# Patient Record
Sex: Female | Born: 1945 | Race: White | Hispanic: No | Marital: Married | Smoking: Former smoker
Health system: Southern US, Community
[De-identification: ages and names within clinical notes are randomized; demographics above are authoritative.]

## PROBLEM LIST (undated history)

## (undated) DIAGNOSIS — F0150 Vascular dementia without behavioral disturbance: Secondary | ICD-10-CM

## (undated) DIAGNOSIS — I639 Cerebral infarction, unspecified: Secondary | ICD-10-CM

## (undated) DIAGNOSIS — Z972 Presence of dental prosthetic device (complete) (partial): Secondary | ICD-10-CM

## (undated) DIAGNOSIS — F419 Anxiety disorder, unspecified: Secondary | ICD-10-CM

## (undated) DIAGNOSIS — D126 Benign neoplasm of colon, unspecified: Secondary | ICD-10-CM

## (undated) DIAGNOSIS — N189 Chronic kidney disease, unspecified: Secondary | ICD-10-CM

## (undated) DIAGNOSIS — E785 Hyperlipidemia, unspecified: Secondary | ICD-10-CM

## (undated) DIAGNOSIS — E7800 Pure hypercholesterolemia, unspecified: Secondary | ICD-10-CM

## (undated) DIAGNOSIS — I679 Cerebrovascular disease, unspecified: Secondary | ICD-10-CM

## (undated) DIAGNOSIS — F329 Major depressive disorder, single episode, unspecified: Secondary | ICD-10-CM

## (undated) DIAGNOSIS — I671 Cerebral aneurysm, nonruptured: Secondary | ICD-10-CM

## (undated) DIAGNOSIS — F32A Depression, unspecified: Secondary | ICD-10-CM

## (undated) DIAGNOSIS — R4701 Aphasia: Secondary | ICD-10-CM

## (undated) DIAGNOSIS — G4730 Sleep apnea, unspecified: Secondary | ICD-10-CM

## (undated) HISTORY — DX: Cerebrovascular disease, unspecified: I67.9

## (undated) HISTORY — DX: Sleep apnea, unspecified: G47.30

## (undated) HISTORY — DX: Pure hypercholesterolemia, unspecified: E78.00

## (undated) HISTORY — DX: Major depressive disorder, single episode, unspecified: F32.9

## (undated) HISTORY — DX: Depression, unspecified: F32.A

## (undated) HISTORY — DX: Chronic kidney disease, unspecified: N18.9

## (undated) HISTORY — DX: Anxiety disorder, unspecified: F41.9

## (undated) HISTORY — PX: CEREBRAL ANEURYSM REPAIR: SHX164

## (undated) HISTORY — DX: Hyperlipidemia, unspecified: E78.5

## (undated) HISTORY — DX: Vascular dementia without behavioral disturbance: F01.50

## (undated) HISTORY — DX: Benign neoplasm of colon, unspecified: D12.6

---

## 2004-12-14 DIAGNOSIS — I671 Cerebral aneurysm, nonruptured: Secondary | ICD-10-CM

## 2004-12-14 HISTORY — DX: Cerebral aneurysm, nonruptured: I67.1

## 2005-11-06 ENCOUNTER — Emergency Department: Payer: Self-pay | Admitting: General Practice

## 2005-11-06 ENCOUNTER — Other Ambulatory Visit: Payer: Self-pay

## 2005-11-30 ENCOUNTER — Encounter: Payer: Self-pay | Admitting: Physical Medicine and Rehabilitation

## 2005-12-09 ENCOUNTER — Emergency Department: Payer: Self-pay | Admitting: Emergency Medicine

## 2005-12-14 ENCOUNTER — Encounter: Payer: Self-pay | Admitting: Physical Medicine and Rehabilitation

## 2006-01-14 ENCOUNTER — Encounter: Payer: Self-pay | Admitting: Physical Medicine and Rehabilitation

## 2006-02-11 ENCOUNTER — Encounter: Payer: Self-pay | Admitting: Physical Medicine and Rehabilitation

## 2006-03-14 ENCOUNTER — Encounter: Payer: Self-pay | Admitting: Physical Medicine and Rehabilitation

## 2006-03-15 ENCOUNTER — Other Ambulatory Visit: Payer: Self-pay

## 2006-03-15 ENCOUNTER — Inpatient Hospital Stay: Payer: Self-pay | Admitting: Vascular Surgery

## 2006-08-05 ENCOUNTER — Ambulatory Visit: Payer: Self-pay | Admitting: Internal Medicine

## 2009-06-20 ENCOUNTER — Ambulatory Visit: Payer: Self-pay | Admitting: Family Medicine

## 2009-10-22 ENCOUNTER — Ambulatory Visit: Payer: Self-pay | Admitting: Gastroenterology

## 2010-12-30 ENCOUNTER — Ambulatory Visit: Payer: Self-pay | Admitting: Family Medicine

## 2011-01-01 ENCOUNTER — Ambulatory Visit: Payer: Self-pay | Admitting: Family Medicine

## 2011-07-07 ENCOUNTER — Ambulatory Visit: Payer: Self-pay | Admitting: Family Medicine

## 2012-02-01 ENCOUNTER — Ambulatory Visit: Payer: Self-pay | Admitting: Family Medicine

## 2012-03-17 ENCOUNTER — Emergency Department: Payer: Self-pay | Admitting: Emergency Medicine

## 2012-03-17 LAB — CBC
HCT: 39.4 % (ref 35.0–47.0)
HGB: 13.3 g/dL (ref 12.0–16.0)
MCH: 31.5 pg (ref 26.0–34.0)
MCHC: 33.8 g/dL (ref 32.0–36.0)
MCV: 93 fL (ref 80–100)
Platelet: 269 10*3/uL (ref 150–440)
RBC: 4.23 10*6/uL (ref 3.80–5.20)
RDW: 13.4 % (ref 11.5–14.5)
WBC: 10.1 10*3/uL (ref 3.6–11.0)

## 2012-03-17 LAB — COMPREHENSIVE METABOLIC PANEL
Albumin: 3.9 g/dL (ref 3.4–5.0)
Alkaline Phosphatase: 47 U/L — ABNORMAL LOW (ref 50–136)
Anion Gap: 12 (ref 7–16)
BUN: 27 mg/dL — ABNORMAL HIGH (ref 7–18)
Bilirubin,Total: 0.5 mg/dL (ref 0.2–1.0)
Calcium, Total: 9.3 mg/dL (ref 8.5–10.1)
Chloride: 107 mmol/L (ref 98–107)
Co2: 23 mmol/L (ref 21–32)
Creatinine: 1.28 mg/dL (ref 0.60–1.30)
EGFR (African American): 54 — ABNORMAL LOW
EGFR (Non-African Amer.): 44 — ABNORMAL LOW
Glucose: 116 mg/dL — ABNORMAL HIGH (ref 65–99)
Osmolality: 289 (ref 275–301)
Potassium: 3.3 mmol/L — ABNORMAL LOW (ref 3.5–5.1)
SGOT(AST): 19 U/L (ref 15–37)
SGPT (ALT): 13 U/L
Sodium: 142 mmol/L (ref 136–145)
Total Protein: 8.4 g/dL — ABNORMAL HIGH (ref 6.4–8.2)

## 2012-03-17 LAB — ETHANOL
Ethanol %: <0.003 % (ref 0.000–0.080)
Ethanol: <3 mg/dL

## 2012-03-17 LAB — AMMONIA: Ammonia, Plasma: 32 mcmol/L (ref 11–32)

## 2012-03-17 LAB — TROPONIN I: Troponin-I: <0.02 ng/mL

## 2012-06-14 ENCOUNTER — Ambulatory Visit: Payer: Self-pay | Admitting: Family Medicine

## 2014-03-27 DIAGNOSIS — D126 Benign neoplasm of colon, unspecified: Secondary | ICD-10-CM

## 2014-03-27 DIAGNOSIS — E7800 Pure hypercholesterolemia, unspecified: Secondary | ICD-10-CM | POA: Insufficient documentation

## 2014-03-27 HISTORY — DX: Benign neoplasm of colon, unspecified: D12.6

## 2014-03-27 HISTORY — DX: Pure hypercholesterolemia, unspecified: E78.00

## 2014-06-19 DIAGNOSIS — F0150 Vascular dementia without behavioral disturbance: Secondary | ICD-10-CM

## 2014-06-19 HISTORY — DX: Vascular dementia, unspecified severity, without behavioral disturbance, psychotic disturbance, mood disturbance, and anxiety: F01.50

## 2014-06-28 DIAGNOSIS — I679 Cerebrovascular disease, unspecified: Secondary | ICD-10-CM | POA: Insufficient documentation

## 2014-06-28 HISTORY — DX: Cerebrovascular disease, unspecified: I67.9

## 2016-01-10 DIAGNOSIS — F0150 Vascular dementia without behavioral disturbance: Secondary | ICD-10-CM | POA: Diagnosis not present

## 2016-01-10 DIAGNOSIS — I10 Essential (primary) hypertension: Secondary | ICD-10-CM | POA: Diagnosis not present

## 2016-01-10 DIAGNOSIS — E7800 Pure hypercholesterolemia, unspecified: Secondary | ICD-10-CM | POA: Diagnosis not present

## 2016-01-10 DIAGNOSIS — F3341 Major depressive disorder, recurrent, in partial remission: Secondary | ICD-10-CM | POA: Diagnosis not present

## 2016-01-10 DIAGNOSIS — I679 Cerebrovascular disease, unspecified: Secondary | ICD-10-CM | POA: Diagnosis not present

## 2016-01-22 DIAGNOSIS — R739 Hyperglycemia, unspecified: Secondary | ICD-10-CM | POA: Diagnosis not present

## 2016-01-22 DIAGNOSIS — R7989 Other specified abnormal findings of blood chemistry: Secondary | ICD-10-CM | POA: Diagnosis not present

## 2016-07-13 DIAGNOSIS — I679 Cerebrovascular disease, unspecified: Secondary | ICD-10-CM | POA: Diagnosis not present

## 2016-07-13 DIAGNOSIS — F3341 Major depressive disorder, recurrent, in partial remission: Secondary | ICD-10-CM | POA: Diagnosis not present

## 2016-07-13 DIAGNOSIS — I10 Essential (primary) hypertension: Secondary | ICD-10-CM | POA: Diagnosis not present

## 2016-07-13 DIAGNOSIS — E7800 Pure hypercholesterolemia, unspecified: Secondary | ICD-10-CM | POA: Diagnosis not present

## 2016-07-13 DIAGNOSIS — F0150 Vascular dementia without behavioral disturbance: Secondary | ICD-10-CM | POA: Diagnosis not present

## 2016-07-13 DIAGNOSIS — R7301 Impaired fasting glucose: Secondary | ICD-10-CM | POA: Diagnosis not present

## 2016-08-18 DIAGNOSIS — R7989 Other specified abnormal findings of blood chemistry: Secondary | ICD-10-CM | POA: Diagnosis not present

## 2016-10-07 DIAGNOSIS — N183 Chronic kidney disease, stage 3 (moderate): Secondary | ICD-10-CM | POA: Diagnosis not present

## 2016-10-07 DIAGNOSIS — I10 Essential (primary) hypertension: Secondary | ICD-10-CM | POA: Diagnosis not present

## 2016-10-07 DIAGNOSIS — R809 Proteinuria, unspecified: Secondary | ICD-10-CM | POA: Diagnosis not present

## 2016-10-07 DIAGNOSIS — E7800 Pure hypercholesterolemia, unspecified: Secondary | ICD-10-CM | POA: Diagnosis not present

## 2016-10-27 DIAGNOSIS — N183 Chronic kidney disease, stage 3 (moderate): Secondary | ICD-10-CM | POA: Diagnosis not present

## 2016-11-02 DIAGNOSIS — N183 Chronic kidney disease, stage 3 (moderate): Secondary | ICD-10-CM | POA: Diagnosis not present

## 2016-11-02 DIAGNOSIS — I10 Essential (primary) hypertension: Secondary | ICD-10-CM | POA: Diagnosis not present

## 2016-11-02 DIAGNOSIS — R809 Proteinuria, unspecified: Secondary | ICD-10-CM | POA: Diagnosis not present

## 2016-11-02 DIAGNOSIS — F17290 Nicotine dependence, other tobacco product, uncomplicated: Secondary | ICD-10-CM | POA: Diagnosis not present

## 2017-01-13 DIAGNOSIS — I10 Essential (primary) hypertension: Secondary | ICD-10-CM | POA: Diagnosis not present

## 2017-01-13 DIAGNOSIS — I679 Cerebrovascular disease, unspecified: Secondary | ICD-10-CM | POA: Diagnosis not present

## 2017-01-13 DIAGNOSIS — E7800 Pure hypercholesterolemia, unspecified: Secondary | ICD-10-CM | POA: Diagnosis not present

## 2017-01-13 DIAGNOSIS — R7301 Impaired fasting glucose: Secondary | ICD-10-CM | POA: Diagnosis not present

## 2017-01-13 DIAGNOSIS — F0150 Vascular dementia without behavioral disturbance: Secondary | ICD-10-CM | POA: Diagnosis not present

## 2017-01-13 DIAGNOSIS — F3341 Major depressive disorder, recurrent, in partial remission: Secondary | ICD-10-CM | POA: Diagnosis not present

## 2017-03-01 DIAGNOSIS — N183 Chronic kidney disease, stage 3 (moderate): Secondary | ICD-10-CM | POA: Diagnosis not present

## 2017-03-01 DIAGNOSIS — I10 Essential (primary) hypertension: Secondary | ICD-10-CM | POA: Diagnosis not present

## 2017-03-01 DIAGNOSIS — F17290 Nicotine dependence, other tobacco product, uncomplicated: Secondary | ICD-10-CM | POA: Diagnosis not present

## 2017-03-01 DIAGNOSIS — R809 Proteinuria, unspecified: Secondary | ICD-10-CM | POA: Diagnosis not present

## 2017-03-02 DIAGNOSIS — R55 Syncope and collapse: Secondary | ICD-10-CM | POA: Diagnosis not present

## 2017-03-02 DIAGNOSIS — F3341 Major depressive disorder, recurrent, in partial remission: Secondary | ICD-10-CM | POA: Diagnosis not present

## 2017-03-02 DIAGNOSIS — I10 Essential (primary) hypertension: Secondary | ICD-10-CM | POA: Diagnosis not present

## 2017-03-15 DIAGNOSIS — H353131 Nonexudative age-related macular degeneration, bilateral, early dry stage: Secondary | ICD-10-CM | POA: Diagnosis not present

## 2017-03-15 DIAGNOSIS — H40033 Anatomical narrow angle, bilateral: Secondary | ICD-10-CM | POA: Diagnosis not present

## 2017-03-15 DIAGNOSIS — H2513 Age-related nuclear cataract, bilateral: Secondary | ICD-10-CM | POA: Diagnosis not present

## 2017-03-18 DIAGNOSIS — R809 Proteinuria, unspecified: Secondary | ICD-10-CM | POA: Diagnosis not present

## 2017-03-18 DIAGNOSIS — F17290 Nicotine dependence, other tobacco product, uncomplicated: Secondary | ICD-10-CM | POA: Diagnosis not present

## 2017-03-18 DIAGNOSIS — N183 Chronic kidney disease, stage 3 (moderate): Secondary | ICD-10-CM | POA: Diagnosis not present

## 2017-03-18 DIAGNOSIS — I10 Essential (primary) hypertension: Secondary | ICD-10-CM | POA: Diagnosis not present

## 2017-03-31 DIAGNOSIS — F3341 Major depressive disorder, recurrent, in partial remission: Secondary | ICD-10-CM | POA: Diagnosis not present

## 2017-04-19 DIAGNOSIS — H40003 Preglaucoma, unspecified, bilateral: Secondary | ICD-10-CM | POA: Diagnosis not present

## 2017-04-19 DIAGNOSIS — H353131 Nonexudative age-related macular degeneration, bilateral, early dry stage: Secondary | ICD-10-CM | POA: Diagnosis not present

## 2017-07-01 DIAGNOSIS — I10 Essential (primary) hypertension: Secondary | ICD-10-CM | POA: Diagnosis not present

## 2017-07-01 DIAGNOSIS — R7301 Impaired fasting glucose: Secondary | ICD-10-CM | POA: Diagnosis not present

## 2017-07-01 DIAGNOSIS — F0150 Vascular dementia without behavioral disturbance: Secondary | ICD-10-CM | POA: Diagnosis not present

## 2017-07-01 DIAGNOSIS — I679 Cerebrovascular disease, unspecified: Secondary | ICD-10-CM | POA: Diagnosis not present

## 2017-07-01 DIAGNOSIS — F3341 Major depressive disorder, recurrent, in partial remission: Secondary | ICD-10-CM | POA: Diagnosis not present

## 2017-07-01 DIAGNOSIS — E7800 Pure hypercholesterolemia, unspecified: Secondary | ICD-10-CM | POA: Diagnosis not present

## 2017-07-27 DIAGNOSIS — E782 Mixed hyperlipidemia: Secondary | ICD-10-CM | POA: Diagnosis not present

## 2017-07-27 DIAGNOSIS — N183 Chronic kidney disease, stage 3 (moderate): Secondary | ICD-10-CM | POA: Diagnosis not present

## 2017-07-27 DIAGNOSIS — E7800 Pure hypercholesterolemia, unspecified: Secondary | ICD-10-CM | POA: Diagnosis not present

## 2017-07-27 DIAGNOSIS — R809 Proteinuria, unspecified: Secondary | ICD-10-CM | POA: Diagnosis not present

## 2017-07-27 DIAGNOSIS — I10 Essential (primary) hypertension: Secondary | ICD-10-CM | POA: Diagnosis not present

## 2017-07-28 DIAGNOSIS — R809 Proteinuria, unspecified: Secondary | ICD-10-CM | POA: Diagnosis not present

## 2017-07-28 DIAGNOSIS — E7800 Pure hypercholesterolemia, unspecified: Secondary | ICD-10-CM | POA: Diagnosis not present

## 2017-07-28 DIAGNOSIS — I10 Essential (primary) hypertension: Secondary | ICD-10-CM | POA: Diagnosis not present

## 2017-07-28 DIAGNOSIS — N183 Chronic kidney disease, stage 3 (moderate): Secondary | ICD-10-CM | POA: Diagnosis not present

## 2017-08-23 DIAGNOSIS — H353131 Nonexudative age-related macular degeneration, bilateral, early dry stage: Secondary | ICD-10-CM | POA: Diagnosis not present

## 2017-08-23 DIAGNOSIS — H40033 Anatomical narrow angle, bilateral: Secondary | ICD-10-CM | POA: Diagnosis not present

## 2017-10-04 DIAGNOSIS — Z23 Encounter for immunization: Secondary | ICD-10-CM | POA: Diagnosis not present

## 2017-12-02 DIAGNOSIS — R809 Proteinuria, unspecified: Secondary | ICD-10-CM | POA: Diagnosis not present

## 2017-12-02 DIAGNOSIS — N2581 Secondary hyperparathyroidism of renal origin: Secondary | ICD-10-CM | POA: Diagnosis not present

## 2017-12-02 DIAGNOSIS — I10 Essential (primary) hypertension: Secondary | ICD-10-CM | POA: Diagnosis not present

## 2017-12-02 DIAGNOSIS — N183 Chronic kidney disease, stage 3 (moderate): Secondary | ICD-10-CM | POA: Diagnosis not present

## 2018-01-03 DIAGNOSIS — I679 Cerebrovascular disease, unspecified: Secondary | ICD-10-CM | POA: Diagnosis not present

## 2018-01-03 DIAGNOSIS — I10 Essential (primary) hypertension: Secondary | ICD-10-CM | POA: Diagnosis not present

## 2018-01-03 DIAGNOSIS — F3341 Major depressive disorder, recurrent, in partial remission: Secondary | ICD-10-CM | POA: Diagnosis not present

## 2018-01-03 DIAGNOSIS — F0150 Vascular dementia without behavioral disturbance: Secondary | ICD-10-CM | POA: Diagnosis not present

## 2018-01-03 DIAGNOSIS — E7800 Pure hypercholesterolemia, unspecified: Secondary | ICD-10-CM | POA: Diagnosis not present

## 2018-01-03 DIAGNOSIS — R7301 Impaired fasting glucose: Secondary | ICD-10-CM | POA: Diagnosis not present

## 2018-03-22 DIAGNOSIS — R809 Proteinuria, unspecified: Secondary | ICD-10-CM | POA: Diagnosis not present

## 2018-03-22 DIAGNOSIS — N2581 Secondary hyperparathyroidism of renal origin: Secondary | ICD-10-CM | POA: Diagnosis not present

## 2018-03-22 DIAGNOSIS — I10 Essential (primary) hypertension: Secondary | ICD-10-CM | POA: Diagnosis not present

## 2018-03-22 DIAGNOSIS — N183 Chronic kidney disease, stage 3 (moderate): Secondary | ICD-10-CM | POA: Diagnosis not present

## 2018-03-28 DIAGNOSIS — H353131 Nonexudative age-related macular degeneration, bilateral, early dry stage: Secondary | ICD-10-CM | POA: Diagnosis not present

## 2018-04-11 ENCOUNTER — Ambulatory Visit (INDEPENDENT_AMBULATORY_CARE_PROVIDER_SITE_OTHER): Payer: Medicare Other | Admitting: Urology

## 2018-04-11 ENCOUNTER — Encounter: Payer: Self-pay | Admitting: Urology

## 2018-04-11 VITALS — BP 172/74 | HR 65 | Ht 65.0 in | Wt 159.0 lb

## 2018-04-11 DIAGNOSIS — R32 Unspecified urinary incontinence: Secondary | ICD-10-CM

## 2018-04-11 DIAGNOSIS — N3946 Mixed incontinence: Secondary | ICD-10-CM

## 2018-04-11 LAB — URINALYSIS, COMPLETE
Bilirubin, UA: NEGATIVE
Glucose, UA: NEGATIVE
Ketones, UA: NEGATIVE
Nitrite, UA: NEGATIVE
Protein, UA: NEGATIVE
Specific Gravity, UA: 1.015 (ref 1.005–1.030)
Urobilinogen, Ur: 0.2 mg/dL (ref 0.2–1.0)
pH, UA: 5.5 (ref 5.0–7.5)

## 2018-04-11 LAB — MICROSCOPIC EXAMINATION: RBC, UA: NONE SEEN /hpf (ref 0–2)

## 2018-04-11 MED ORDER — MIRABEGRON ER 50 MG PO TB24: 50.0000 mg | ORAL_TABLET | Freq: Every day | ORAL | 11 refills | Status: DC

## 2018-04-11 NOTE — Progress Notes (Signed)
04/11/2018 3:26 PM   Linda [name] [birth year] [id number]  Referring provider: No referring provider defined for this encounter.  Chief Complaint  Patient presents with  . Urinary Incontinence    HPI: I was consulted to assess the patient's urge incontinence and primarily bedwetting.  She also reports stress incontinence.  She wears 1 pad a day at night that is quite wet.  Details of the history were difficult to ascertain.  She has had a brain tumor.  She has had a stroke.  She gets up twice a night and voids every 2 hours during the daytime  She is not had a hysterectomy.  Presentation is not been medically treated.  She denies a history of kidney stones or previous GU surgery  Modifying factors: There are no other modifying factors  Associated signs and symptoms: There are no other associated signs and symptoms Aggravating and relieving factors: There are no other aggravating or relieving factors Severity: Moderate Duration: Persistent   PMH: Past Medical History:  Diagnosis Date  . Anxiety   . Chronic kidney disease   . Depression   . Sleep apnea     Surgical History: Past Surgical History:  Procedure Laterality Date  . BRAIN TUMOR EXCISION      Home Medications:  Allergies as of 04/11/2018   No Known Allergies     Medication List        Accurate as of 04/11/18  3:26 PM. Always use your most recent med list.          citalopram 20 MG tablet Commonly known as:  CELEXA Take 20 mg by mouth daily.   clopidogrel 75 MG tablet Commonly known as:  PLAVIX Take by mouth.   losartan 50 MG tablet Commonly known as:  COZAAR   simvastatin 80 MG tablet Commonly known as:  ZOCOR   Vitamin D (Ergocalciferol) 50000 units Caps capsule Commonly known as:  DRISDOL       Allergies: No Known Allergies  Family History: History reviewed. No pertinent family history.  Social History:  reports that she has been smoking.  She has never used smokeless tobacco.  She reports that she drank alcohol. She reports that she does not use drugs.  ROS: UROLOGY Frequent Urination?: Yes Hard to postpone urination?: No Burning/pain with urination?: No Get up at night to urinate?: No Leakage of urine?: Yes Urine stream starts and stops?: No Trouble starting stream?: No Do you have to strain to urinate?: No Blood in urine?: No Urinary tract infection?: No Sexually transmitted disease?: No Injury to kidneys or bladder?: No Painful intercourse?: No Weak stream?: No Currently pregnant?: No Vaginal bleeding?: No Last menstrual period?: n  Gastrointestinal Nausea?: No Vomiting?: No Indigestion/heartburn?: No Diarrhea?: No Constipation?: No  Constitutional Fever: No Night sweats?: No Weight loss?: No Fatigue?: Yes  Skin Skin rash/lesions?: No Itching?: No  Eyes Blurred vision?: Yes Double vision?: No  Ears/Nose/Throat Sore throat?: No Sinus problems?: Yes  Hematologic/Lymphatic Swollen glands?: No Easy bruising?: Yes  Cardiovascular Leg swelling?: No Chest pain?: No  Respiratory Cough?: No Shortness of breath?: No  Endocrine Excessive thirst?: No  Musculoskeletal Back pain?: No Joint pain?: No  Neurological Headaches?: Yes Dizziness?: Yes  Psychologic Depression?: Yes Anxiety?: Yes  Physical Exam: BP (!) 172/74 (BP Location: Right Arm, Patient Position: Sitting, Cuff Size: Normal)   Pulse 65   Ht 5\' 5"  (1.651 m)   Wt 159 lb (72.1 kg)   BMI 26.46 kg/m   Constitutional:  Alert  and oriented, No acute distress. HEENT: [place] AT, moist mucus membranes.  Trachea midline, no masses. Cardiovascular: No clubbing, cyanosis, or edema. Respiratory: Normal respiratory effort, no increased work of breathing. GI: Abdomen is soft, nontender, nondistended, no abdominal masses GU: Grade 1 cystocele.  Grade 1 hypermobility of the urethra.  No stress incontinence but cannot cough hard. Skin: No rashes, bruises or suspicious  lesions. Lymph: No cervical or inguinal adenopathy. Neurologic: Grossly intact, no focal deficits, moving all 4 extremities. Psychiatric: Normal mood and affect.  Laboratory Data: Lab Results  Component Value Date   WBC 10.1 03/17/2012   HGB 13.3 03/17/2012   HCT 39.4 03/17/2012   MCV 93 03/17/2012   PLT 269 03/17/2012    Lab Results  Component Value Date   CREATININE 1.28 03/17/2012    \ Urinalysis No results found for: COLORURINE, APPEARANCEUR, LABSPEC, PHURINE, GLUCOSEU, HGBUR, BILIRUBINUR, KETONESUR, PROTEINUR, UROBILINOGEN, NITRITE, LEUKOCYTESUR  Pertinent Imaging: None  Assessment & Plan: The patient has mixed incontinence and bedwetting but likely primarily an overactive bladder.  She is milder frequency and nocturia.  She is not a good historian.  I would like to try the patient on Myrbetriq samples and prescription  I will not order urodynamics at this stage.  I did not perform cystoscopy but send the urine for culture and MAy performed in the future.  She was a little bit nervous  1. Urinary incontinence, unspecified type 2. Mixed incontinence   - Urinalysis, Complete   No follow-ups on file.  Reece Packer, MD  [facility] [address], [place], [place] [address] [phone]

## 2018-04-13 LAB — CULTURE, URINE COMPREHENSIVE

## 2018-04-15 ENCOUNTER — Telehealth: Payer: Self-pay | Admitting: Urology

## 2018-04-15 DIAGNOSIS — H40033 Anatomical narrow angle, bilateral: Secondary | ICD-10-CM | POA: Diagnosis not present

## 2018-04-15 DIAGNOSIS — H2513 Age-related nuclear cataract, bilateral: Secondary | ICD-10-CM | POA: Diagnosis not present

## 2018-04-15 NOTE — Telephone Encounter (Signed)
It looks like you put this pt on Myrbetriq, please advise.

## 2018-04-15 NOTE — Telephone Encounter (Signed)
Husband called office stating that the Rx that [name] sent to [facility] is going to cost them $558 and they can't afford that.  [name], pt's husband wants to know if there is an alternative cheaper medication his wife can take? Please advise pt husband, [name] on his cell @ [phone] not home # Thanks.

## 2018-04-18 ENCOUNTER — Other Ambulatory Visit: Payer: Self-pay

## 2018-04-18 ENCOUNTER — Telehealth: Payer: Self-pay

## 2018-04-18 MED ORDER — NITROFURANTOIN MONOHYD MACRO 100 MG PO CAPS: 100.0000 mg | ORAL_CAPSULE | Freq: Two times a day (BID) | ORAL | 0 refills | Status: DC

## 2018-04-18 NOTE — Telephone Encounter (Signed)
Patient's husband notified and script sent to pharm

## 2018-04-18 NOTE — Telephone Encounter (Signed)
Called and spoke w/ pt husband, [name]. He states he went ahead and just purchased the medication. It is a 90 day supply and he will contact our office to discuss her medications when the 90 day supply ends.

## 2018-04-18 NOTE — Telephone Encounter (Signed)
Detrol LA  4mg 

## 2018-04-18 NOTE — Telephone Encounter (Signed)
-----   Message from Bjorn Loser, MD sent at 04/18/2018  7:46 AM EDT ----- Macrodantin 100 mg twice a day for 7 days   ----- Message ----- From: Royanne Foots, CMA Sent: 04/13/2018   4:54 PM To: Bjorn Loser, MD    ----- Message ----- From: Interface, [facility] Lab Results In Sent: 04/11/2018   4:37 PM To: Rowe Robert Clinical

## 2018-04-21 DIAGNOSIS — H2512 Age-related nuclear cataract, left eye: Secondary | ICD-10-CM | POA: Diagnosis not present

## 2018-04-22 NOTE — Discharge Instructions (Signed)

## 2018-04-27 ENCOUNTER — Encounter
Admission: RE | Disposition: A | Discharge status: Home / Self Care | Payer: Self-pay | Source: Ambulatory Visit | Attending: Ophthalmology

## 2018-04-27 ENCOUNTER — Ambulatory Visit
Admission: RE | Admit: 2018-04-27 | Discharge: 2018-04-27 | Disposition: A | Discharge status: Home / Self Care | Payer: Medicare Other | Source: Ambulatory Visit | Attending: Ophthalmology | Admitting: Ophthalmology

## 2018-04-27 ENCOUNTER — Ambulatory Visit: Payer: Medicare Other | Admitting: Anesthesiology

## 2018-04-27 DIAGNOSIS — F0150 Vascular dementia without behavioral disturbance: Secondary | ICD-10-CM | POA: Insufficient documentation

## 2018-04-27 DIAGNOSIS — N189 Chronic kidney disease, unspecified: Secondary | ICD-10-CM | POA: Insufficient documentation

## 2018-04-27 DIAGNOSIS — F329 Major depressive disorder, single episode, unspecified: Secondary | ICD-10-CM | POA: Diagnosis not present

## 2018-04-27 DIAGNOSIS — Z8673 Personal history of transient ischemic attack (TIA), and cerebral infarction without residual deficits: Secondary | ICD-10-CM | POA: Diagnosis not present

## 2018-04-27 DIAGNOSIS — E7800 Pure hypercholesterolemia, unspecified: Secondary | ICD-10-CM | POA: Diagnosis not present

## 2018-04-27 DIAGNOSIS — H2512 Age-related nuclear cataract, left eye: Secondary | ICD-10-CM | POA: Insufficient documentation

## 2018-04-27 DIAGNOSIS — H25812 Combined forms of age-related cataract, left eye: Secondary | ICD-10-CM | POA: Diagnosis not present

## 2018-04-27 DIAGNOSIS — G4730 Sleep apnea, unspecified: Secondary | ICD-10-CM | POA: Diagnosis not present

## 2018-04-27 DIAGNOSIS — F17210 Nicotine dependence, cigarettes, uncomplicated: Secondary | ICD-10-CM | POA: Diagnosis not present

## 2018-04-27 HISTORY — PX: CATARACT EXTRACTION W/PHACO: SHX586

## 2018-04-27 HISTORY — DX: Cerebral aneurysm, nonruptured: I67.1

## 2018-04-27 HISTORY — DX: Presence of dental prosthetic device (complete) (partial): Z97.2

## 2018-04-27 HISTORY — DX: Vascular dementia, unspecified severity, without behavioral disturbance, psychotic disturbance, mood disturbance, and anxiety: F01.50

## 2018-04-27 HISTORY — DX: Cerebral infarction, unspecified: I63.9

## 2018-04-27 HISTORY — DX: Aphasia: R47.01

## 2018-04-27 SURGERY — PHACOEMULSIFICATION, CATARACT, WITH IOL INSERTION
Anesthesia: Monitor Anesthesia Care | Site: Eye | Laterality: Left | Wound class: "Clean "

## 2018-04-27 MED ORDER — ARMC OPHTHALMIC DILATING DROPS
1.0000 "application " | OPHTHALMIC | Status: DC | PRN
  Administered 2018-04-27 (×3): 1 via OPHTHALMIC

## 2018-04-27 MED ORDER — CEFUROXIME OPHTHALMIC INJECTION 1 MG/0.1 ML
INJECTION | OPHTHALMIC | Status: DC | PRN
  Administered 2018-04-27: 0.1 mL via INTRACAMERAL

## 2018-04-27 MED ORDER — ACETAMINOPHEN 160 MG/5ML PO SOLN: 325.0000 mg | ORAL | Status: DC | PRN

## 2018-04-27 MED ORDER — LIDOCAINE HCL (PF) 2 % IJ SOLN
INTRAOCULAR | Status: DC | PRN
  Administered 2018-04-27: 1 mL

## 2018-04-27 MED ORDER — NA HYALUR & NA CHOND-NA HYALUR 0.4-0.35 ML IO KIT
PACK | INTRAOCULAR | Status: DC | PRN
  Administered 2018-04-27: 1 mL via INTRAOCULAR

## 2018-04-27 MED ORDER — LACTATED RINGERS IV SOLN: INTRAVENOUS | Status: DC

## 2018-04-27 MED ORDER — MOXIFLOXACIN HCL 0.5 % OP SOLN
1.0000 [drp] | OPHTHALMIC | Status: DC | PRN
  Administered 2018-04-27 (×3): 1 [drp] via OPHTHALMIC

## 2018-04-27 MED ORDER — EPINEPHRINE PF 1 MG/ML IJ SOLN
INTRAOCULAR | Status: DC | PRN
  Administered 2018-04-27: 61 mL via OPHTHALMIC

## 2018-04-27 MED ORDER — ACETAMINOPHEN 325 MG PO TABS: 650.0000 mg | ORAL_TABLET | Freq: Once | ORAL | Status: DC | PRN

## 2018-04-27 MED ORDER — FENTANYL CITRATE (PF) 100 MCG/2ML IJ SOLN
INTRAMUSCULAR | Status: DC | PRN
  Administered 2018-04-27: 50 ug via INTRAVENOUS

## 2018-04-27 MED ORDER — BRIMONIDINE TARTRATE-TIMOLOL 0.2-0.5 % OP SOLN
OPHTHALMIC | Status: DC | PRN
  Administered 2018-04-27: 1 [drp] via OPHTHALMIC

## 2018-04-27 MED ORDER — ONDANSETRON HCL 4 MG/2ML IJ SOLN: 4.0000 mg | Freq: Once | INTRAMUSCULAR | Status: DC | PRN

## 2018-04-27 SURGICAL SUPPLY — 20 items
CANNULA ANT/CHMB 27G (MISCELLANEOUS) ×1 IMPLANT
CANNULA ANT/CHMB 27GA (MISCELLANEOUS) ×3 IMPLANT
GLOVE SURG LX 7.5 STRW (GLOVE) ×2
GLOVE SURG LX STRL 7.5 STRW (GLOVE) ×1 IMPLANT
GLOVE SURG TRIUMPH 8.0 PF LTX (GLOVE) ×3 IMPLANT
GOWN STRL REUS W/ TWL LRG LVL3 (GOWN DISPOSABLE) ×2 IMPLANT
GOWN STRL REUS W/TWL LRG LVL3 (GOWN DISPOSABLE) ×4
LENS IOL TECNIS ITEC 22.5 (Intraocular Lens) ×2 IMPLANT
MARKER SKIN DUAL TIP RULER LAB (MISCELLANEOUS) ×3 IMPLANT
NDL FILTER BLUNT 18X1 1/2 (NEEDLE) ×1 IMPLANT
NEEDLE FILTER BLUNT 18X 1/2SAF (NEEDLE) ×2
NEEDLE FILTER BLUNT 18X1 1/2 (NEEDLE) ×1 IMPLANT
PACK CATARACT BRASINGTON (MISCELLANEOUS) ×3 IMPLANT
PACK EYE AFTER SURG (MISCELLANEOUS) ×3 IMPLANT
PACK OPTHALMIC (MISCELLANEOUS) ×3 IMPLANT
SYR 3ML LL SCALE MARK (SYRINGE) ×3 IMPLANT
SYR 5ML LL (SYRINGE) ×3 IMPLANT
SYR TB 1ML LUER SLIP (SYRINGE) ×3 IMPLANT
WATER STERILE IRR 500ML POUR (IV SOLUTION) ×3 IMPLANT
WIPE NON LINTING 3.25X3.25 (MISCELLANEOUS) ×3 IMPLANT

## 2018-04-27 NOTE — Op Note (Signed)
OPERATIVE NOTE  MIGNON BECHLER [id number] 04/27/2018   PREOPERATIVE DIAGNOSIS:  Nuclear sclerotic cataract left eye. H25.12   POSTOPERATIVE DIAGNOSIS:    Nuclear sclerotic cataract left eye.     PROCEDURE:  Phacoemusification with posterior chamber intraocular lens placement of the left eye   LENS:   Implant Name Type Inv. Item Serial No. Manufacturer Lot No. LRB No. Used  LENS IOL DIOP 22.5 - [id number] Intraocular Lens LENS IOL DIOP 22.5 [id number]  Left 1        ULTRASOUND TIME: 13  % of 0 minutes 50 seconds, CDE 6.7  SURGEON:  Wyonia Hough, MD   ANESTHESIA:  Topical with tetracaine drops and 2% Xylocaine jelly, augmented with 1% preservative-free intracameral lidocaine.    COMPLICATIONS:  None.   DESCRIPTION OF PROCEDURE:  The patient was identified in the holding room and transported to the operating room and placed in the supine position under the operating microscope.  The left eye was identified as the operative eye and it was prepped and draped in the usual sterile ophthalmic fashion.   A 1 millimeter clear-corneal paracentesis was made at the 1:30 position.  0.5 ml of preservative-free 1% lidocaine was injected into the anterior chamber.  The anterior chamber was filled with Viscoat viscoelastic.  A 2.4 millimeter keratome was used to make a near-clear corneal incision at the 10:30 position.  .  A curvilinear capsulorrhexis was made with a cystotome and capsulorrhexis forceps.  Balanced salt solution was used to hydrodissect and hydrodelineate the nucleus.   Phacoemulsification was then used in stop and chop fashion to remove the lens nucleus and epinucleus.  The remaining cortex was then removed using the irrigation and aspiration handpiece. Provisc was then placed into the capsular bag to distend it for lens placement.  A lens was then injected into the capsular bag.  The remaining viscoelastic was aspirated.   Wounds were hydrated with balanced salt  solution.  The anterior chamber was inflated to a physiologic pressure with balanced salt solution.  No wound leaks were noted. Cefuroxime 0.1 ml of a 10mg /ml solution was injected into the anterior chamber for a dose of 1 mg of intracameral antibiotic at the completion of the case.   Timolol and Brimonidine drops were applied to the eye.  The patient was taken to the recovery room in stable condition without complications of anesthesia or surgery.  Ziyah Cordoba 04/27/2018, 11:42 AM

## 2018-04-27 NOTE — Transfer of Care (Signed)
Immediate Anesthesia Transfer of Care Note  Patient: Linda [name]  Procedure(s) Performed: CATARACT EXTRACTION PHACO AND INTRAOCULAR LENS PLACEMENT (IOC) LEFT (Left Eye)  Patient Location: PACU  Anesthesia Type: MAC  Level of Consciousness: awake, alert  and patient cooperative  Airway and Oxygen Therapy: Patient Spontanous Breathing and Patient connected to supplemental oxygen  Post-op Assessment: Post-op Vital signs reviewed, Patient's Cardiovascular Status Stable, Respiratory Function Stable, Patent Airway and No signs of Nausea or vomiting  Post-op Vital Signs: Reviewed and stable  Complications: No apparent anesthesia complications

## 2018-04-27 NOTE — Anesthesia Postprocedure Evaluation (Signed)
Anesthesia Post Note  Patient: Linda [name]  Procedure(s) Performed: CATARACT EXTRACTION PHACO AND INTRAOCULAR LENS PLACEMENT (IOC) LEFT (Left Eye)  Patient location during evaluation: PACU Anesthesia Type: MAC Level of consciousness: awake and alert, oriented and patient cooperative Pain management: pain level controlled Vital Signs Assessment: post-procedure vital signs reviewed and stable Respiratory status: spontaneous breathing, nonlabored ventilation and respiratory function stable Cardiovascular status: blood pressure returned to baseline and stable Postop Assessment: adequate PO intake Anesthetic complications: no    Darrin Nipper

## 2018-04-27 NOTE — H&P (Signed)
The History and Physical notes are on paper, have been signed, and are to be scanned. The patient remains stable and unchanged from the H&P.   Previous H&P reviewed, patient examined, and there are no changes.  Linda [name] 04/27/2018 10:56 AM

## 2018-04-27 NOTE — Anesthesia Preprocedure Evaluation (Signed)
Anesthesia Evaluation  Patient identified by MRN, date of birth, ID band Patient awake    Reviewed: Allergy & Precautions, NPO status , Patient's Chart, lab work & pertinent test results  History of Anesthesia Complications Negative for: history of anesthetic complications  Airway Mallampati: IV  TM Distance: >3 FB Neck ROM: Full  Mouth opening: Limited Mouth Opening  Dental  (+) Lower Dentures   Pulmonary sleep apnea , Current Smoker (1 cigarette per day),    Pulmonary exam normal breath sounds clear to auscultation       Cardiovascular Exercise Tolerance: Good negative cardio ROS Normal cardiovascular exam Rhythm:Regular Rate:Normal     Neuro/Psych PSYCHIATRIC DISORDERS Anxiety Depression Dementia Aneurysm and stroke 2006 s/p repair; no residual physical symptoms, but pt with vascular dementia CVA    GI/Hepatic negative GI ROS,   Endo/Other  negative endocrine ROS  Renal/GU negative Renal ROS     Musculoskeletal   Abdominal   Peds  Hematology negative hematology ROS (+)   Anesthesia Other Findings   Reproductive/Obstetrics                             Anesthesia Physical Anesthesia Plan  ASA: III  Anesthesia Plan: MAC   Post-op Pain Management:    Induction: Intravenous  PONV Risk Score and Plan: 1 and TIVA and Treatment may vary due to age or medical condition  Airway Management Planned: Natural Airway  Additional Equipment:   Intra-op Plan:   Post-operative Plan:   Informed Consent: I have reviewed the patients History and Physical, chart, labs and discussed the procedure including the risks, benefits and alternatives for the proposed anesthesia with the patient or authorized representative who has indicated his/her understanding and acceptance.     Plan Discussed with: CRNA  Anesthesia Plan Comments:         Anesthesia Quick Evaluation

## 2018-04-27 NOTE — Anesthesia Procedure Notes (Signed)
Date/Time: 04/27/2018 11:28 AM Performed by: Cameron Ali, CRNA Pre-anesthesia Checklist: Patient identified, Emergency Drugs available, Suction available, Timeout performed and Patient being monitored Patient Re-evaluated:Patient Re-evaluated prior to induction Oxygen Delivery Method: Nasal cannula Placement Confirmation: positive ETCO2

## 2018-04-28 ENCOUNTER — Encounter: Payer: Self-pay | Admitting: Ophthalmology

## 2018-05-19 DIAGNOSIS — H02042 Spastic entropion of right lower eyelid: Secondary | ICD-10-CM | POA: Diagnosis not present

## 2018-05-19 DIAGNOSIS — H02035 Senile entropion of left lower eyelid: Secondary | ICD-10-CM | POA: Diagnosis not present

## 2018-05-20 ENCOUNTER — Telehealth: Payer: Self-pay | Admitting: Urology

## 2018-05-20 NOTE — Telephone Encounter (Signed)
We had to move the patient's app and they needed more samples per Lattie Haw ok to give them a box of Myrbetriq 25mg  lot# [id number] exp 12-20   Sharyn Lull

## 2018-05-23 ENCOUNTER — Ambulatory Visit: Payer: Medicare Other

## 2018-05-23 DIAGNOSIS — F329 Major depressive disorder, single episode, unspecified: Secondary | ICD-10-CM | POA: Diagnosis not present

## 2018-05-23 DIAGNOSIS — F17200 Nicotine dependence, unspecified, uncomplicated: Secondary | ICD-10-CM | POA: Diagnosis not present

## 2018-05-23 DIAGNOSIS — F0390 Unspecified dementia without behavioral disturbance: Secondary | ICD-10-CM | POA: Diagnosis not present

## 2018-05-23 DIAGNOSIS — E785 Hyperlipidemia, unspecified: Secondary | ICD-10-CM | POA: Diagnosis not present

## 2018-05-23 DIAGNOSIS — I10 Essential (primary) hypertension: Secondary | ICD-10-CM | POA: Diagnosis not present

## 2018-05-24 DIAGNOSIS — E785 Hyperlipidemia, unspecified: Secondary | ICD-10-CM | POA: Diagnosis not present

## 2018-05-24 DIAGNOSIS — H02035 Senile entropion of left lower eyelid: Secondary | ICD-10-CM | POA: Diagnosis not present

## 2018-05-24 DIAGNOSIS — F0150 Vascular dementia without behavioral disturbance: Secondary | ICD-10-CM | POA: Diagnosis not present

## 2018-05-24 DIAGNOSIS — H02045 Spastic entropion of left lower eyelid: Secondary | ICD-10-CM | POA: Diagnosis not present

## 2018-05-24 DIAGNOSIS — F17210 Nicotine dependence, cigarettes, uncomplicated: Secondary | ICD-10-CM | POA: Diagnosis not present

## 2018-05-24 DIAGNOSIS — Z8673 Personal history of transient ischemic attack (TIA), and cerebral infarction without residual deficits: Secondary | ICD-10-CM | POA: Diagnosis not present

## 2018-05-24 DIAGNOSIS — H02042 Spastic entropion of right lower eyelid: Secondary | ICD-10-CM | POA: Diagnosis not present

## 2018-05-24 DIAGNOSIS — I10 Essential (primary) hypertension: Secondary | ICD-10-CM | POA: Diagnosis not present

## 2018-05-24 DIAGNOSIS — F419 Anxiety disorder, unspecified: Secondary | ICD-10-CM | POA: Diagnosis not present

## 2018-05-24 DIAGNOSIS — H02105 Unspecified ectropion of left lower eyelid: Secondary | ICD-10-CM | POA: Diagnosis not present

## 2018-05-24 DIAGNOSIS — H02102 Unspecified ectropion of right lower eyelid: Secondary | ICD-10-CM | POA: Diagnosis not present

## 2018-06-08 ENCOUNTER — Other Ambulatory Visit: Payer: Self-pay

## 2018-06-08 ENCOUNTER — Encounter: Payer: Self-pay | Admitting: *Deleted

## 2018-06-08 DIAGNOSIS — H2511 Age-related nuclear cataract, right eye: Secondary | ICD-10-CM | POA: Diagnosis not present

## 2018-06-10 NOTE — Discharge Instructions (Signed)

## 2018-06-13 ENCOUNTER — Ambulatory Visit (INDEPENDENT_AMBULATORY_CARE_PROVIDER_SITE_OTHER): Payer: Medicare Other | Admitting: Urology

## 2018-06-13 ENCOUNTER — Encounter: Payer: Self-pay | Admitting: Urology

## 2018-06-13 VITALS — BP 147/81 | HR 72 | Ht 65.0 in | Wt 155.5 lb

## 2018-06-13 DIAGNOSIS — N3946 Mixed incontinence: Secondary | ICD-10-CM | POA: Diagnosis not present

## 2018-06-13 MED ORDER — OXYBUTYNIN CHLORIDE ER 10 MG PO TB24: 10.0000 mg | ORAL_TABLET | Freq: Every day | ORAL | 11 refills | Status: DC

## 2018-06-13 MED ORDER — TOLTERODINE TARTRATE ER 4 MG PO CP24: 4.0000 mg | ORAL_CAPSULE | Freq: Every day | ORAL | 11 refills | Status: DC

## 2018-06-13 NOTE — Progress Notes (Signed)
06/13/2018 10:48 AM   Linda [name] 1946-02-19 [id number]  Referring provider: Sofie Hartigan, MD [place], [place] [address]  Chief Complaint  Patient presents with  . Urinary Incontinence    HPI: I was consulted to assess the patient's urge incontinence and primarily bedwetting.  She also reports stress incontinence.  She wears 1 pad a day at night that is quite wet.  Details of the history were difficult to ascertain.  She has had a brain tumor.  She has had a stroke.  She gets up twice a night and voids every 2 hours during the daytime    The patient has mixed incontinence and bedwetting but likely primarily an overactive bladder.  She is milder frequency and nocturia.  She is not a good historian.  I would like to try the patient on Myrbetriq samples and prescription  I will not order urodynamics at this stage.  I did not perform cystoscopy but send the urine for culture and MAy performed in the future.  She was a little bit nervous   The patient did beautifully on the Myrbetriq.  No more bedwetting.  Voiding less frequently.  She still has mild stress incontinence.  Unfortunately it $100 per month    PMH: Past Medical History:  Diagnosis Date  . Anxiety   . Brain aneurysm 2006  . Chronic kidney disease   . Depression   . Receptive aphasia   . Sleep apnea   . Stroke ([place])   . Vascular dementia   . Wears dentures     Surgical History: Past Surgical History:  Procedure Laterality Date  . CATARACT EXTRACTION W/PHACO Left 04/27/2018   Procedure: CATARACT EXTRACTION PHACO AND INTRAOCULAR LENS PLACEMENT ([place]) LEFT;  Surgeon: Leandrew Koyanagi, MD;  Location: [place];  Service: Ophthalmology;  Laterality: Left;  . CEREBRAL ANEURYSM REPAIR      Home Medications:  Allergies as of 06/13/2018   No Known Allergies     Medication List        Accurate as of 06/13/18 10:48 AM. Always use your most recent med list.          citalopram 20  MG tablet Commonly known as:  CELEXA Take 20 mg by mouth daily.   clopidogrel 75 MG tablet Commonly known as:  PLAVIX Take by mouth.   losartan 50 MG tablet Commonly known as:  COZAAR   multivitamin-lutein Caps capsule Take 1 capsule by mouth daily.   MYRBETRIQ 25 MG Tb24 tablet Generic drug:  mirabegron ER Take 25 mg by mouth daily.   simvastatin 80 MG tablet Commonly known as:  ZOCOR   Vitamin D (Ergocalciferol) 50000 units Caps capsule Commonly known as:  DRISDOL       Allergies: No Known Allergies  Family History: History reviewed. No pertinent family history.  Social History:  reports that she has been smoking cigarettes.  She has been smoking about 1.00 pack per day. She has never used smokeless tobacco. She reports that she drank alcohol. She reports that she does not use drugs.  ROS: UROLOGY Frequent Urination?: No Hard to postpone urination?: No Burning/pain with urination?: No Get up at night to urinate?: Yes Leakage of urine?: Yes Urine stream starts and stops?: No Trouble starting stream?: No Do you have to strain to urinate?: No Blood in urine?: No Urinary tract infection?: No Sexually transmitted disease?: No Injury to kidneys or bladder?: No Painful intercourse?: No Weak stream?: No Currently pregnant?: No Vaginal  bleeding?: No Last menstrual period?: n  Gastrointestinal Nausea?: No Vomiting?: No Indigestion/heartburn?: No Diarrhea?: No Constipation?: No  Constitutional Fever: No Night sweats?: Yes Weight loss?: No Fatigue?: No  Skin Skin rash/lesions?: No Itching?: No  Eyes Blurred vision?: Yes Double vision?: No  Ears/Nose/Throat Sore throat?: No Sinus problems?: No  Hematologic/Lymphatic Swollen glands?: No Easy bruising?: Yes  Cardiovascular Leg swelling?: No Chest pain?: No  Respiratory Cough?: No Shortness of breath?: No  Endocrine Excessive thirst?: No  Musculoskeletal Back pain?: No Joint pain?:  No  Neurological Headaches?: No Dizziness?: No  Psychologic Depression?: No Anxiety?: Yes  Physical Exam: BP (!) 147/81 (BP Location: Right Arm, Patient Position: Sitting, Cuff Size: Normal)   Pulse 72   Ht 5\' 5"  (1.651 m)   Wt 70.5 kg (155 lb 8 oz)   BMI 25.88 kg/m   Constitutional:  Alert and oriented, No acute distress.  Laboratory Data: Lab Results  Component Value Date   WBC 10.1 03/17/2012   HGB 13.3 03/17/2012   HCT 39.4 03/17/2012   MCV 93 03/17/2012   PLT 269 03/17/2012    Lab Results  Component Value Date   CREATININE 1.28 03/17/2012    No results found for: PSA  No results found for: TESTOSTERONE  No results found for: HGBA1C  Urinalysis    Component Value Date/Time   APPEARANCEUR Cloudy (A) 04/11/2018 1601   GLUCOSEU Negative 04/11/2018 1601   BILIRUBINUR Negative 04/11/2018 1601   PROTEINUR Negative 04/11/2018 1601   NITRITE Negative 04/11/2018 1601   LEUKOCYTESUR Trace (A) 04/11/2018 1601    Pertinent Imaging: None  Assessment & Plan: Prescription of Detrol LA 4 mg and oxybutynin ER 10 mg.  I will see her in 8 to 10 weeks.  She can always go back to the Myrbetriq.  We will proceed accordingly  There are no diagnoses linked to this encounter.  No follow-ups on file.  Reece Packer, MD  [facility] [address], [place], [place] [address] [phone]

## 2018-06-14 ENCOUNTER — Ambulatory Visit: Payer: Medicare Other | Admitting: Anesthesiology

## 2018-06-14 ENCOUNTER — Ambulatory Visit
Admission: RE | Admit: 2018-06-14 | Discharge: 2018-06-14 | Disposition: A | Discharge status: Home / Self Care | Payer: Medicare Other | Source: Ambulatory Visit | Attending: Ophthalmology | Admitting: Ophthalmology

## 2018-06-14 ENCOUNTER — Encounter
Admission: RE | Disposition: A | Discharge status: Home / Self Care | Payer: Self-pay | Source: Ambulatory Visit | Attending: Ophthalmology

## 2018-06-14 DIAGNOSIS — F17200 Nicotine dependence, unspecified, uncomplicated: Secondary | ICD-10-CM | POA: Insufficient documentation

## 2018-06-14 DIAGNOSIS — H25811 Combined forms of age-related cataract, right eye: Secondary | ICD-10-CM | POA: Diagnosis not present

## 2018-06-14 DIAGNOSIS — H2511 Age-related nuclear cataract, right eye: Secondary | ICD-10-CM | POA: Insufficient documentation

## 2018-06-14 DIAGNOSIS — E7800 Pure hypercholesterolemia, unspecified: Secondary | ICD-10-CM | POA: Insufficient documentation

## 2018-06-14 DIAGNOSIS — I129 Hypertensive chronic kidney disease with stage 1 through stage 4 chronic kidney disease, or unspecified chronic kidney disease: Secondary | ICD-10-CM | POA: Diagnosis not present

## 2018-06-14 DIAGNOSIS — Z79899 Other long term (current) drug therapy: Secondary | ICD-10-CM | POA: Insufficient documentation

## 2018-06-14 DIAGNOSIS — N189 Chronic kidney disease, unspecified: Secondary | ICD-10-CM | POA: Insufficient documentation

## 2018-06-14 DIAGNOSIS — F329 Major depressive disorder, single episode, unspecified: Secondary | ICD-10-CM | POA: Insufficient documentation

## 2018-06-14 HISTORY — PX: CATARACT EXTRACTION W/PHACO: SHX586

## 2018-06-14 SURGERY — PHACOEMULSIFICATION, CATARACT, WITH IOL INSERTION
Anesthesia: Monitor Anesthesia Care | Laterality: Right | Wound class: "Clean "

## 2018-06-14 MED ORDER — BRIMONIDINE TARTRATE-TIMOLOL 0.2-0.5 % OP SOLN
OPHTHALMIC | Status: DC | PRN
  Administered 2018-06-14: 1 [drp] via OPHTHALMIC

## 2018-06-14 MED ORDER — FENTANYL CITRATE (PF) 100 MCG/2ML IJ SOLN
INTRAMUSCULAR | Status: DC | PRN
  Administered 2018-06-14: 50 ug via INTRAVENOUS

## 2018-06-14 MED ORDER — OXYCODONE HCL 5 MG/5ML PO SOLN: 5.0000 mg | Freq: Once | ORAL | Status: DC | PRN

## 2018-06-14 MED ORDER — MOXIFLOXACIN HCL 0.5 % OP SOLN
1.0000 [drp] | OPHTHALMIC | Status: DC | PRN
  Administered 2018-06-14 (×3): 1 [drp] via OPHTHALMIC

## 2018-06-14 MED ORDER — CEFUROXIME OPHTHALMIC INJECTION 1 MG/0.1 ML
INJECTION | OPHTHALMIC | Status: DC | PRN
  Administered 2018-06-14: 0.1 mL via OPHTHALMIC

## 2018-06-14 MED ORDER — NA HYALUR & NA CHOND-NA HYALUR 0.4-0.35 ML IO KIT
PACK | INTRAOCULAR | Status: DC | PRN
  Administered 2018-06-14: 1 mL via INTRAOCULAR

## 2018-06-14 MED ORDER — LIDOCAINE HCL (PF) 2 % IJ SOLN
INTRAOCULAR | Status: DC | PRN
  Administered 2018-06-14: 1 mL via INTRAMUSCULAR

## 2018-06-14 MED ORDER — LACTATED RINGERS IV SOLN: INTRAVENOUS | Status: DC

## 2018-06-14 MED ORDER — MIDAZOLAM HCL 2 MG/2ML IJ SOLN
INTRAMUSCULAR | Status: DC | PRN
  Administered 2018-06-14: 1 mg via INTRAVENOUS

## 2018-06-14 MED ORDER — OXYCODONE HCL 5 MG PO TABS: 5.0000 mg | ORAL_TABLET | Freq: Once | ORAL | Status: DC | PRN

## 2018-06-14 MED ORDER — ARMC OPHTHALMIC DILATING DROPS
1.0000 "application " | OPHTHALMIC | Status: DC | PRN
  Administered 2018-06-14 (×3): 1 via OPHTHALMIC

## 2018-06-14 MED ORDER — EPINEPHRINE PF 1 MG/ML IJ SOLN
INTRAOCULAR | Status: DC | PRN
  Administered 2018-06-14: 50 mL via OPHTHALMIC

## 2018-06-14 SURGICAL SUPPLY — 27 items
CANNULA ANT/CHMB 27G (MISCELLANEOUS) ×1 IMPLANT
CANNULA ANT/CHMB 27GA (MISCELLANEOUS) ×3 IMPLANT
CARTRIDGE ABBOTT (MISCELLANEOUS) IMPLANT
GLOVE SURG LX 7.5 STRW (GLOVE) ×2
GLOVE SURG LX STRL 7.5 STRW (GLOVE) ×1 IMPLANT
GLOVE SURG TRIUMPH 8.0 PF LTX (GLOVE) ×3 IMPLANT
GOWN STRL REUS W/ TWL LRG LVL3 (GOWN DISPOSABLE) ×2 IMPLANT
GOWN STRL REUS W/TWL LRG LVL3 (GOWN DISPOSABLE) ×4
LENS IOL TECNIS ITEC 22.0 (Intraocular Lens) ×2 IMPLANT
MARKER SKIN DUAL TIP RULER LAB (MISCELLANEOUS) ×3 IMPLANT
NDL FILTER BLUNT 18X1 1/2 (NEEDLE) ×1 IMPLANT
NDL RETROBULBAR .5 NSTRL (NEEDLE) IMPLANT
NEEDLE FILTER BLUNT 18X 1/2SAF (NEEDLE) ×2
NEEDLE FILTER BLUNT 18X1 1/2 (NEEDLE) ×1 IMPLANT
PACK CATARACT BRASINGTON (MISCELLANEOUS) ×3 IMPLANT
PACK EYE AFTER SURG (MISCELLANEOUS) ×3 IMPLANT
PACK OPTHALMIC (MISCELLANEOUS) ×3 IMPLANT
RING MALYGIN 7.0 (MISCELLANEOUS) IMPLANT
SUT ETHILON 10-0 CS-B-6CS-B-6 (SUTURE)
SUT VICRYL  9 0 (SUTURE)
SUT VICRYL 9 0 (SUTURE) IMPLANT
SUTURE EHLN 10-0 CS-B-6CS-B-6 (SUTURE) IMPLANT
SYR 3ML LL SCALE MARK (SYRINGE) ×3 IMPLANT
SYR 5ML LL (SYRINGE) ×3 IMPLANT
SYR TB 1ML LUER SLIP (SYRINGE) ×3 IMPLANT
WATER STERILE IRR 500ML POUR (IV SOLUTION) ×3 IMPLANT
WIPE NON LINTING 3.25X3.25 (MISCELLANEOUS) ×3 IMPLANT

## 2018-06-14 NOTE — Anesthesia Preprocedure Evaluation (Signed)
Anesthesia Evaluation  Patient identified by MRN, date of birth, ID band Patient awake    Reviewed: Allergy & Precautions, H&P , NPO status , Patient's Chart, lab work & pertinent test results  Airway Mallampati: II  TM Distance: >3 FB Neck ROM: full    Dental no notable dental hx.    Pulmonary Current Smoker,    Pulmonary exam normal        Cardiovascular negative cardio ROS Normal cardiovascular exam     Neuro/Psych    GI/Hepatic negative GI ROS, Neg liver ROS,   Endo/Other    Renal/GU      Musculoskeletal   Abdominal   Peds  Hematology negative hematology ROS (+)   Anesthesia Other Findings   Reproductive/Obstetrics negative OB ROS                             Anesthesia Physical Anesthesia Plan  ASA: II  Anesthesia Plan: MAC   Post-op Pain Management:    Induction:   PONV Risk Score and Plan:   Airway Management Planned:   Additional Equipment:   Intra-op Plan:   Post-operative Plan:   Informed Consent: I have reviewed the patients History and Physical, chart, labs and discussed the procedure including the risks, benefits and alternatives for the proposed anesthesia with the patient or authorized representative who has indicated his/her understanding and acceptance.     Plan Discussed with:   Anesthesia Plan Comments:         Anesthesia Quick Evaluation

## 2018-06-14 NOTE — Transfer of Care (Signed)
Immediate Anesthesia Transfer of Care Note  Patient: Linda [name]  Procedure(s) Performed: CATARACT EXTRACTION PHACO AND INTRAOCULAR LENS PLACEMENT (IOC)  RIGHT (Right )  Patient Location: PACU  Anesthesia Type: MAC  Level of Consciousness: awake, alert  and patient cooperative  Airway and Oxygen Therapy: Patient Spontanous Breathing and Patient connected to supplemental oxygen  Post-op Assessment: Post-op Vital signs reviewed, Patient's Cardiovascular Status Stable, Respiratory Function Stable, Patent Airway and No signs of Nausea or vomiting  Post-op Vital Signs: Reviewed and stable  Complications: No apparent anesthesia complications

## 2018-06-14 NOTE — H&P (Signed)
The History and Physical notes are on paper, have been signed, and are to be scanned. The patient remains stable and unchanged from the H&P.   Previous H&P reviewed, patient examined, and there are no changes.  Linda [name] 06/14/2018 9:57 AM

## 2018-06-14 NOTE — Op Note (Signed)
LOCATION:  [facility]   PREOPERATIVE DIAGNOSIS:    Nuclear sclerotic cataract right eye. H25.11   POSTOPERATIVE DIAGNOSIS:  Nuclear sclerotic cataract right eye.     PROCEDURE:  Phacoemusification with posterior chamber intraocular lens placement of the right eye   LENS:   Implant Name Type Inv. Item Serial No. Manufacturer Lot No. LRB No. Used  LENS IOL DIOP 22.0 - [id number] Intraocular Lens LENS IOL DIOP 22.0 [id number] AMO  Right 1        ULTRASOUND TIME: 14 % of 0 minutes, 56 seconds.  CDE 8.0   SURGEON:  Wyonia Hough, MD   ANESTHESIA:  Topical with tetracaine drops and 2% Xylocaine jelly, augmented with 1% preservative-free intracameral lidocaine.    COMPLICATIONS:  None.   DESCRIPTION OF PROCEDURE:  The patient was identified in the holding room and transported to the operating room and placed in the supine position under the operating microscope.  The right eye was identified as the operative eye and it was prepped and draped in the usual sterile ophthalmic fashion.   A 1 millimeter clear-corneal paracentesis was made at the 12:00 position.  0.5 ml of preservative-free 1% lidocaine was injected into the anterior chamber. The anterior chamber was filled with Viscoat viscoelastic.  A 2.4 millimeter keratome was used to make a near-clear corneal incision at the 9:00 position.  A curvilinear capsulorrhexis was made with a cystotome and capsulorrhexis forceps.  Balanced salt solution was used to hydrodissect and hydrodelineate the nucleus.   Phacoemulsification was then used in stop and chop fashion to remove the lens nucleus and epinucleus.  The remaining cortex was then removed using the irrigation and aspiration handpiece. Provisc was then placed into the capsular bag to distend it for lens placement.  A lens was then injected into the capsular bag.  The remaining viscoelastic was aspirated.   Wounds were hydrated with balanced salt solution.  The anterior  chamber was inflated to a physiologic pressure with balanced salt solution.  No wound leaks were noted. Cefuroxime 0.1 ml of a 10mg /ml solution was injected into the anterior chamber for a dose of 1 mg of intracameral antibiotic at the completion of the case.   Timolol and Brimonidine drops were applied to the eye.  The patient was taken to the recovery room in stable condition without complications of anesthesia or surgery.   Ardella Chhim 06/14/2018, 10:38 AM

## 2018-06-14 NOTE — Anesthesia Postprocedure Evaluation (Signed)
Anesthesia Post Note  Patient: Linda [name]  Procedure(s) Performed: CATARACT EXTRACTION PHACO AND INTRAOCULAR LENS PLACEMENT (IOC)  RIGHT (Right )  Patient location during evaluation: PACU Anesthesia Type: MAC Level of consciousness: awake and alert Pain management: pain level controlled Vital Signs Assessment: post-procedure vital signs reviewed and stable Respiratory status: spontaneous breathing Cardiovascular status: blood pressure returned to baseline Postop Assessment: no headache Anesthetic complications: no    Jaci Standard, III,  Alvie Fowles D

## 2018-06-15 ENCOUNTER — Encounter: Payer: Self-pay | Admitting: Ophthalmology

## 2018-07-06 DIAGNOSIS — I10 Essential (primary) hypertension: Secondary | ICD-10-CM | POA: Diagnosis not present

## 2018-07-06 DIAGNOSIS — F3341 Major depressive disorder, recurrent, in partial remission: Secondary | ICD-10-CM | POA: Diagnosis not present

## 2018-07-06 DIAGNOSIS — F0150 Vascular dementia without behavioral disturbance: Secondary | ICD-10-CM | POA: Diagnosis not present

## 2018-07-06 DIAGNOSIS — R7302 Impaired glucose tolerance (oral): Secondary | ICD-10-CM | POA: Diagnosis not present

## 2018-07-06 DIAGNOSIS — I679 Cerebrovascular disease, unspecified: Secondary | ICD-10-CM | POA: Diagnosis not present

## 2018-07-06 DIAGNOSIS — E7800 Pure hypercholesterolemia, unspecified: Secondary | ICD-10-CM | POA: Diagnosis not present

## 2018-07-21 DIAGNOSIS — D631 Anemia in chronic kidney disease: Secondary | ICD-10-CM | POA: Diagnosis not present

## 2018-07-21 DIAGNOSIS — N183 Chronic kidney disease, stage 3 (moderate): Secondary | ICD-10-CM | POA: Diagnosis not present

## 2018-07-21 DIAGNOSIS — I10 Essential (primary) hypertension: Secondary | ICD-10-CM | POA: Diagnosis not present

## 2018-07-21 DIAGNOSIS — R809 Proteinuria, unspecified: Secondary | ICD-10-CM | POA: Diagnosis not present

## 2018-07-21 DIAGNOSIS — N2581 Secondary hyperparathyroidism of renal origin: Secondary | ICD-10-CM | POA: Diagnosis not present

## 2018-07-22 DIAGNOSIS — H35373 Puckering of macula, bilateral: Secondary | ICD-10-CM | POA: Diagnosis not present

## 2018-08-08 ENCOUNTER — Ambulatory Visit (INDEPENDENT_AMBULATORY_CARE_PROVIDER_SITE_OTHER): Payer: Medicare Other | Admitting: Urology

## 2018-08-08 ENCOUNTER — Encounter: Payer: Self-pay | Admitting: Urology

## 2018-08-08 VITALS — BP 137/50 | HR 70 | Ht 65.0 in | Wt 159.0 lb

## 2018-08-08 DIAGNOSIS — N3946 Mixed incontinence: Secondary | ICD-10-CM

## 2018-08-08 NOTE — Progress Notes (Signed)
08/08/2018 11:02 AM   Linda [name] Oct 07, 1946 [id number]  Referring provider: Sofie Hartigan, MD [place], [place] [address]  Chief Complaint  Patient presents with  . Urinary Incontinence    8wk follow up    HPI: I was consulted to assess the patient's urge incontinence and primarily bedwetting. She also reports stress incontinence. She wears 1 pad a day at night that is quite wet. Details of the history were difficult to ascertain.  She has had a brain tumor. She has had a stroke. She gets up twice a night and voids every 2 hours during the daytime    The patient has mixed incontinence and bedwetting but likely primarily an overactive bladder. She is milder frequency and nocturia. She is not a good historian. I would like to try the patient on Myrbetriq samples and prescription I will not order urodynamics at this stage. I did not performcystoscopy but send the urine for culture and MAyperformed in the future. She was a little bit nervous  The patient did beautifully on the Myrbetriq.  No more bedwetting.  Voiding less frequently.  She still has mild stress incontinence.  Unfortunately it $100 per month so was given Detrol and oxybutynin.  She had one positive urine culture in April 2019  Today Frequency stable. Continent with no bedwetting on oxybutynin ER 10 mg very pleased.  Clinically not infected     PMH: Past Medical History:  Diagnosis Date  . Anxiety   . Brain aneurysm 2006  . Chronic kidney disease   . Depression   . Receptive aphasia   . Sleep apnea   . Stroke ([facility])   . Vascular dementia   . Wears dentures     Surgical History: Past Surgical History:  Procedure Laterality Date  . CATARACT EXTRACTION W/PHACO Left 04/27/2018   Procedure: CATARACT EXTRACTION PHACO AND INTRAOCULAR LENS PLACEMENT ([facility]) LEFT;  Surgeon: Leandrew Koyanagi, MD;  Location: [facility];  Service: Ophthalmology;  Laterality: Left;  .  CATARACT EXTRACTION W/PHACO Right 06/14/2018   Procedure: CATARACT EXTRACTION PHACO AND INTRAOCULAR LENS PLACEMENT ([facility])  RIGHT;  Surgeon: Leandrew Koyanagi, MD;  Location: [facility];  Service: Ophthalmology;  Laterality: Right;  . CEREBRAL ANEURYSM REPAIR      Home Medications:  Allergies as of 08/08/2018   No Known Allergies     Medication List        Accurate as of 08/08/18 11:02 AM. Always use your most recent med list.          citalopram 20 MG tablet Commonly known as:  CELEXA Take 20 mg by mouth daily.   clopidogrel 75 MG tablet Commonly known as:  PLAVIX Take by mouth.   losartan 50 MG tablet Commonly known as:  COZAAR   multivitamin-lutein Caps capsule Take 1 capsule by mouth daily.   MYRBETRIQ 25 MG Tb24 tablet Generic drug:  mirabegron ER Take 25 mg by mouth daily.   oxybutynin 10 MG 24 hr tablet Commonly known as:  DITROPAN-XL Take 1 tablet (10 mg total) by mouth daily.   simvastatin 80 MG tablet Commonly known as:  ZOCOR   tolterodine 4 MG 24 hr capsule Commonly known as:  DETROL LA Take 1 capsule (4 mg total) by mouth daily.   Vitamin D (Ergocalciferol) 50000 units Caps capsule Commonly known as:  DRISDOL       Allergies: No Known Allergies  Family History: No family history on file.  Social History:  reports  that she has been smoking cigarettes. She has been smoking about 1.00 pack per day. She has never used smokeless tobacco. She reports that she drank alcohol. She reports that she does not use drugs.  ROS: UROLOGY Frequent Urination?: No Hard to postpone urination?: Yes Burning/pain with urination?: No Get up at night to urinate?: Yes Leakage of urine?: Yes Urine stream starts and stops?: No Trouble starting stream?: No Do you have to strain to urinate?: No Blood in urine?: No Urinary tract infection?: No Sexually transmitted disease?: No Injury to kidneys or bladder?: No Painful intercourse?: No Weak stream?:  No Currently pregnant?: No Vaginal bleeding?: No Last menstrual period?: n  Gastrointestinal Nausea?: No Vomiting?: No Indigestion/heartburn?: No Diarrhea?: No Constipation?: No  Constitutional Fever: No Night sweats?: No Weight loss?: No Fatigue?: Yes  Skin Skin rash/lesions?: No Itching?: No  Eyes Blurred vision?: No Double vision?: No  Ears/Nose/Throat Sore throat?: No Sinus problems?: No  Hematologic/Lymphatic Swollen glands?: No Easy bruising?: No  Cardiovascular Leg swelling?: No Chest pain?: No  Respiratory Cough?: No Shortness of breath?: No  Endocrine Excessive thirst?: No  Musculoskeletal Back pain?: No Joint pain?: No  Neurological Headaches?: No Dizziness?: No  Psychologic Depression?: No Anxiety?: Yes  Physical Exam: BP (!) 137/50   Pulse 70   Ht 5\' 5"  (1.651 m)   Wt 159 lb (72.1 kg)   BMI 26.46 kg/m   Constitutional:  Alert and oriented, No acute distress.  Laboratory Data: Lab Results  Component Value Date   WBC 10.1 03/17/2012   HGB 13.3 03/17/2012   HCT 39.4 03/17/2012   MCV 93 03/17/2012   PLT 269 03/17/2012    Lab Results  Component Value Date   CREATININE 1.28 03/17/2012    No results found for: PSA  No results found for: TESTOSTERONE  No results found for: HGBA1C  Urinalysis    Component Value Date/Time   APPEARANCEUR Cloudy (A) 04/11/2018 1601   GLUCOSEU Negative 04/11/2018 1601   BILIRUBINUR Negative 04/11/2018 1601   PROTEINUR Negative 04/11/2018 1601   NITRITE Negative 04/11/2018 1601   LEUKOCYTESUR Trace (A) 04/11/2018 1601    Pertinent Imaging:   Assessment & Plan: Reassess in 6 months and then annually  There are no diagnoses linked to this encounter.  No follow-ups on file.  Reece Packer, MD  [facility] [address] [place], [place] [address] [phone]

## 2018-10-04 DIAGNOSIS — Z23 Encounter for immunization: Secondary | ICD-10-CM | POA: Diagnosis not present

## 2018-11-24 DIAGNOSIS — I10 Essential (primary) hypertension: Secondary | ICD-10-CM | POA: Diagnosis not present

## 2018-11-24 DIAGNOSIS — N2581 Secondary hyperparathyroidism of renal origin: Secondary | ICD-10-CM | POA: Diagnosis not present

## 2018-11-24 DIAGNOSIS — D631 Anemia in chronic kidney disease: Secondary | ICD-10-CM | POA: Diagnosis not present

## 2018-11-24 DIAGNOSIS — N183 Chronic kidney disease, stage 3 (moderate): Secondary | ICD-10-CM | POA: Diagnosis not present

## 2019-02-13 ENCOUNTER — Ambulatory Visit: Payer: Medicare Other | Admitting: Urology

## 2019-02-20 ENCOUNTER — Ambulatory Visit: Payer: Medicare Other | Admitting: Urology

## 2019-02-20 ENCOUNTER — Ambulatory Visit (INDEPENDENT_AMBULATORY_CARE_PROVIDER_SITE_OTHER): Payer: Medicare Other | Admitting: Urology

## 2019-02-20 ENCOUNTER — Encounter: Payer: Self-pay | Admitting: Urology

## 2019-02-20 VITALS — BP 136/68 | HR 65 | Ht 63.0 in | Wt 154.0 lb

## 2019-02-20 DIAGNOSIS — N3946 Mixed incontinence: Secondary | ICD-10-CM | POA: Diagnosis not present

## 2019-02-20 DIAGNOSIS — E785 Hyperlipidemia, unspecified: Secondary | ICD-10-CM

## 2019-02-20 DIAGNOSIS — F329 Major depressive disorder, single episode, unspecified: Secondary | ICD-10-CM | POA: Insufficient documentation

## 2019-02-20 DIAGNOSIS — F32A Depression, unspecified: Secondary | ICD-10-CM | POA: Insufficient documentation

## 2019-02-20 DIAGNOSIS — R4701 Aphasia: Secondary | ICD-10-CM | POA: Insufficient documentation

## 2019-02-20 HISTORY — DX: Hyperlipidemia, unspecified: E78.5

## 2019-02-20 NOTE — Progress Notes (Signed)
02/20/2019 1:06 PM   Linda [name] Jul 30, 1946 [id number]  Referring provider: Sofie Hartigan, MD [place], [place] [address]  Chief Complaint  Patient presents with  . Urinary Incontinence    6 month    HPI: I was consulted to assess the patient's urge incontinence and primarily bedwetting. She also reports stress incontinence. She wears 1 pad a day at night that is quite wet. Details of the history were difficult to ascertain.  She has had a brain tumor. She has had a stroke. She gets up twice a night and voids every 2 hours during the daytime   The patient has mixed incontinence and bedwetting but likely primarily an overactive bladder. She is milder frequency and nocturia. She is not a good historian. I would like to try the patient on Myrbetriq samples and prescription I will not order urodynamics at this stage. I did not performcystoscopy but send the urine for culture and MAyperformed in the future. She was a little bit nervous  The patient did beautifully on the Myrbetriq. No more bedwetting. Voiding less frequently. She still has mild stress incontinence. Unfortunately it $100 per month so was given Detrol and oxybutynin.  She had one positive urine culture in April 2019  Continent with no bedwetting on oxybutynin ER 10 mg very pleased.   Today No infections.  Frequency stable.  Stop the oxybutynin 2 weeks ago and is currently continent and very pleased   PMH: Past Medical History:  Diagnosis Date  . Anxiety   . Benign neoplasm of colon 03/27/2014  . Brain aneurysm 2006  . Cerebrovascular disease 06/28/2014  . Chronic kidney disease   . Dementia, vascular ([facility]) 06/19/2014  . Depression   . Hyperlipidemia 02/20/2019  . Pure hypercholesterolemia 03/27/2014  . Receptive aphasia   . Sleep apnea   . Stroke ([facility])   . Vascular dementia ([facility])   . Wears dentures     Surgical History: Past Surgical History:  Procedure Laterality Date    . CATARACT EXTRACTION W/PHACO Left 04/27/2018   Procedure: CATARACT EXTRACTION PHACO AND INTRAOCULAR LENS PLACEMENT ([facility]) LEFT;  Surgeon: Leandrew Koyanagi, MD;  Location: [facility];  Service: Ophthalmology;  Laterality: Left;  . CATARACT EXTRACTION W/PHACO Right 06/14/2018   Procedure: CATARACT EXTRACTION PHACO AND INTRAOCULAR LENS PLACEMENT ([facility])  RIGHT;  Surgeon: Leandrew Koyanagi, MD;  Location: [facility];  Service: Ophthalmology;  Laterality: Right;  . CEREBRAL ANEURYSM REPAIR      Home Medications:  Allergies as of 02/20/2019   No Known Allergies     Medication List       Accurate as of February 20, 2019  1:06 PM. Always use your most recent med list.        citalopram 20 MG tablet Commonly known as:  CELEXA Take 20 mg by mouth daily.   clopidogrel 75 MG tablet Commonly known as:  PLAVIX Take by mouth.   losartan 50 MG tablet Commonly known as:  COZAAR   multivitamin-lutein Caps capsule Take 1 capsule by mouth daily.   Myrbetriq 25 MG Tb24 tablet Generic drug:  mirabegron ER Take 25 mg by mouth daily.   oxybutynin 10 MG 24 hr tablet Commonly known as:  DITROPAN-XL Take 1 tablet (10 mg total) by mouth daily.   simvastatin 80 MG tablet Commonly known as:  ZOCOR   tolterodine 4 MG 24 hr capsule Commonly known as:  Detrol LA Take 1 capsule (4 mg total) by mouth daily.  Vitamin D (Ergocalciferol) 1.25 MG (50000 UT) Caps capsule Commonly known as:  DRISDOL       Allergies: No Known Allergies  Family History: No family history on file.  Social History:  reports that she has been smoking cigarettes. She has been smoking about 1.00 pack per day. She has never used smokeless tobacco. She reports previous alcohol use. She reports that she does not use drugs.  ROS: UROLOGY Frequent Urination?: No Hard to postpone urination?: No Burning/pain with urination?: No Get up at night to urinate?: No Leakage of urine?: No Urine stream starts  and stops?: No Trouble starting stream?: No Do you have to strain to urinate?: No Blood in urine?: No Urinary tract infection?: No Sexually transmitted disease?: No Injury to kidneys or bladder?: No Painful intercourse?: No Weak stream?: No Currently pregnant?: No Vaginal bleeding?: No Last menstrual period?: n  Gastrointestinal Nausea?: No Vomiting?: No Indigestion/heartburn?: No Diarrhea?: No Constipation?: No  Constitutional Fever: No Night sweats?: No Weight loss?: No Fatigue?: No  Skin Skin rash/lesions?: No Itching?: No  Eyes Blurred vision?: No Double vision?: No  Ears/Nose/Throat Sore throat?: No Sinus problems?: No  Hematologic/Lymphatic Swollen glands?: No Easy bruising?: No  Cardiovascular Leg swelling?: No Chest pain?: No  Respiratory Cough?: No Shortness of breath?: No  Endocrine Excessive thirst?: No  Musculoskeletal Back pain?: No Joint pain?: No  Neurological Headaches?: No Dizziness?: No  Psychologic Depression?: No Anxiety?: No  Physical Exam: BP 136/68   Pulse 65   Ht 5\' 3"  (1.6 m)   Wt 154 lb (69.9 kg)   BMI 27.28 kg/m   Constitutional:  Alert and oriented, No acute distress.   Laboratory Data: Lab Results  Component Value Date   WBC 10.1 03/17/2012   HGB 13.3 03/17/2012   HCT 39.4 03/17/2012   MCV 93 03/17/2012   PLT 269 03/17/2012    Lab Results  Component Value Date   CREATININE 1.28 03/17/2012    No results found for: PSA  No results found for: TESTOSTERONE  No results found for: HGBA1C  Urinalysis    Component Value Date/Time   APPEARANCEUR Cloudy (A) 04/11/2018 1601   GLUCOSEU Negative 04/11/2018 1601   BILIRUBINUR Negative 04/11/2018 1601   PROTEINUR Negative 04/11/2018 1601   NITRITE Negative 04/11/2018 1601   LEUKOCYTESUR Trace (A) 04/11/2018 1601    Pertinent Imaging:   Assessment & Plan: See PRN.  Instructed patient to restart oxybutynin if symptoms return  There are no  diagnoses linked to this encounter.  No follow-ups on file.  Reece Packer, MD  [facility] [address] [place], [place] [address] [phone]

## 2019-02-23 ENCOUNTER — Other Ambulatory Visit (INDEPENDENT_AMBULATORY_CARE_PROVIDER_SITE_OTHER): Payer: Self-pay | Admitting: Vascular Surgery

## 2019-02-23 ENCOUNTER — Encounter (INDEPENDENT_AMBULATORY_CARE_PROVIDER_SITE_OTHER): Payer: Self-pay

## 2019-02-23 ENCOUNTER — Ambulatory Visit (INDEPENDENT_AMBULATORY_CARE_PROVIDER_SITE_OTHER): Payer: Medicare Other | Admitting: Vascular Surgery

## 2019-02-23 ENCOUNTER — Other Ambulatory Visit: Payer: Self-pay

## 2019-02-23 ENCOUNTER — Ambulatory Visit (INDEPENDENT_AMBULATORY_CARE_PROVIDER_SITE_OTHER): Payer: Medicare Other

## 2019-02-23 ENCOUNTER — Encounter (INDEPENDENT_AMBULATORY_CARE_PROVIDER_SITE_OTHER): Payer: Self-pay | Admitting: Vascular Surgery

## 2019-02-23 VITALS — BP 192/75 | HR 64 | Resp 16 | Ht 66.0 in | Wt 153.4 lb

## 2019-02-23 DIAGNOSIS — I739 Peripheral vascular disease, unspecified: Secondary | ICD-10-CM

## 2019-02-23 DIAGNOSIS — Z7982 Long term (current) use of aspirin: Secondary | ICD-10-CM

## 2019-02-23 DIAGNOSIS — F17210 Nicotine dependence, cigarettes, uncomplicated: Secondary | ICD-10-CM

## 2019-02-23 DIAGNOSIS — E782 Mixed hyperlipidemia: Secondary | ICD-10-CM | POA: Diagnosis not present

## 2019-02-23 DIAGNOSIS — I10 Essential (primary) hypertension: Secondary | ICD-10-CM

## 2019-02-23 DIAGNOSIS — I779 Disorder of arteries and arterioles, unspecified: Secondary | ICD-10-CM

## 2019-02-23 DIAGNOSIS — I6523 Occlusion and stenosis of bilateral carotid arteries: Secondary | ICD-10-CM | POA: Diagnosis not present

## 2019-02-23 DIAGNOSIS — Z7902 Long term (current) use of antithrombotics/antiplatelets: Secondary | ICD-10-CM

## 2019-02-23 DIAGNOSIS — Z79899 Other long term (current) drug therapy: Secondary | ICD-10-CM

## 2019-02-26 ENCOUNTER — Encounter (INDEPENDENT_AMBULATORY_CARE_PROVIDER_SITE_OTHER): Payer: Self-pay | Admitting: Vascular Surgery

## 2019-02-26 DIAGNOSIS — I6529 Occlusion and stenosis of unspecified carotid artery: Secondary | ICD-10-CM | POA: Insufficient documentation

## 2019-02-26 DIAGNOSIS — I10 Essential (primary) hypertension: Secondary | ICD-10-CM | POA: Insufficient documentation

## 2019-02-26 DIAGNOSIS — I739 Peripheral vascular disease, unspecified: Secondary | ICD-10-CM | POA: Insufficient documentation

## 2019-02-26 NOTE — Progress Notes (Signed)
MRN : [id number]  Linda [name] is a 73 y.o. ([birth year]) female who presents with chief complaint of  Chief Complaint  Patient presents with  . New Patient (Initial Visit)    ref [facility] for possible carotid stenosis  .  History of Present Illness:   The patient is seen for evaluation of carotid stenosis. The carotid stenosis was identified after duplex ultrasound was obtained because of a bruit.  The patient denies amaurosis fugax. There is no recent history of TIA symptoms or focal motor deficits. There is no prior documented CVA.  There is no history of migraine headaches. There is no history of seizures.  The patient is taking enteric-coated aspirin 81 mg daily.  The patient has a history of coronary artery disease, no recent episodes of angina or shortness of breath. The patient denies PAD or claudication symptoms. There is a history of hyperlipidemia which is being treated with a statin.   Duplex ultrasound shows <70% bilateral ICA stenosis  Current Meds  Medication Sig  . citalopram (CELEXA) 20 MG tablet Take 20 mg by mouth daily.  . clopidogrel (PLAVIX) 75 MG tablet Take by mouth.  . losartan (COZAAR) 50 MG tablet   . mirabegron ER (MYRBETRIQ) 25 MG TB24 tablet Take 25 mg by mouth daily.  . multivitamin-lutein (OCUVITE-LUTEIN) CAPS capsule Take 1 capsule by mouth daily.  Marland Kitchen oxybutynin (DITROPAN-XL) 10 MG 24 hr tablet Take 1 tablet (10 mg total) by mouth daily.  . simvastatin (ZOCOR) 80 MG tablet   . tolterodine (DETROL LA) 4 MG 24 hr capsule Take 1 capsule (4 mg total) by mouth daily.  . Vitamin D, Ergocalciferol, (DRISDOL) 50000 units CAPS capsule     Past Medical History:  Diagnosis Date  . Anxiety   . Benign neoplasm of colon 03/27/2014  . Brain aneurysm 2006  . Cerebrovascular disease 06/28/2014  . Chronic kidney disease   . Dementia, vascular ([facility]) 06/19/2014  . Depression   . Hyperlipidemia 02/20/2019  . Pure hypercholesterolemia 03/27/2014  . Receptive  aphasia   . Sleep apnea   . Stroke ([facility])   . Vascular dementia ([facility])   . Wears dentures     Past Surgical History:  Procedure Laterality Date  . CATARACT EXTRACTION W/PHACO Left 04/27/2018   Procedure: CATARACT EXTRACTION PHACO AND INTRAOCULAR LENS PLACEMENT ([facility]) LEFT;  Surgeon: Leandrew Koyanagi, MD;  Location: [facility];  Service: Ophthalmology;  Laterality: Left;  . CATARACT EXTRACTION W/PHACO Right 06/14/2018   Procedure: CATARACT EXTRACTION PHACO AND INTRAOCULAR LENS PLACEMENT ([facility])  RIGHT;  Surgeon: Leandrew Koyanagi, MD;  Location: [facility];  Service: Ophthalmology;  Laterality: Right;  . CEREBRAL ANEURYSM REPAIR      Social History Social History   Tobacco Use  . Smoking status: Current Every Day Smoker    Packs/day: 1.00    Types: Cigarettes  . Smokeless tobacco: Never Used  Substance Use Topics  . Alcohol use: Not Currently  . Drug use: Never    Family History Family History  Problem Relation Age of Onset  . Dementia Mother   . Heart attack Brother   No family history of bleeding/clotting disorders, porphyria or autoimmune disease   No Known Allergies   REVIEW OF SYSTEMS (Negative unless checked)  Constitutional: [] Weight loss  [] Fever  [] Chills Cardiac: [] Chest pain   [] Chest pressure   [] Palpitations   [] Shortness of breath when laying flat   [] Shortness of breath with exertion. Vascular:  [] Pain in legs with walking   []   Pain in legs at rest  [] History of DVT   [] Phlebitis   [] Swelling in legs   [] Varicose veins   [] Non-healing ulcers Pulmonary:   [] Uses home oxygen   [] Productive cough   [] Hemoptysis   [] Wheeze  [] COPD   [] Asthma Neurologic:  [] Dizziness   [] Seizures   [] History of stroke   [] History of TIA  [] Aphasia   [] Vissual changes   [] Weakness or numbness in arm   [] Weakness or numbness in leg Musculoskeletal:   [] Joint swelling   [] Joint pain   [] Low back pain Hematologic:  [] Easy bruising  [] Easy bleeding    [] Hypercoagulable state   [] Anemic Gastrointestinal:  [] Diarrhea   [] Vomiting  [] Gastroesophageal reflux/heartburn   [] Difficulty swallowing. Genitourinary:  [] Chronic kidney disease   [] Difficult urination  [] Frequent urination   [] Blood in urine Skin:  [] Rashes   [] Ulcers  Psychological:  [] History of anxiety   []  History of major depression.  Physical Examination  Vitals:   02/23/19 1428  BP: (!) 192/75  Pulse: 64  Resp: 16  Weight: 153 lb 6.4 oz (69.6 kg)  Height: 5\' 6"  (1.676 m)   Body mass index is 24.76 kg/m. Gen: WD/WN, NAD Head: [place]/AT, No temporalis wasting.  Ear/Nose/Throat: Hearing grossly intact, nares w/o erythema or drainage, poor dentition Eyes: PER, EOMI, sclera nonicteric.  Neck: Supple, no masses.  No bruit or JVD.  Pulmonary:  Good air movement, clear to auscultation bilaterally, no use of accessory muscles.  Cardiac: RRR, normal S1, S2, no Murmurs. Vascular: bilateral carotid bruits Vessel Right Left  Radial Palpable Palpable  Brachial Palpable Palpable  Carotid Palpable Palpable  PT Not Palpable Not alpable  DP Trace Palpable Trace Palpable  Gastrointestinal: soft, non-distended. No guarding/no peritoneal signs.  Musculoskeletal: M/S 5/5 throughout.  No deformity or atrophy.  Neurologic: CN 2-12 intact. Pain and light touch intact in extremities.  Symmetrical.  Speech is fluent. Motor exam as listed above. Psychiatric: Judgment intact, Mood & affect appropriate for pt's clinical situation. Dermatologic: No rashes or ulcers noted.  No changes consistent with cellulitis. Lymph : No Cervical lymphadenopathy, no lichenification or skin changes of chronic lymphedema.  CBC Lab Results  Component Value Date   WBC 10.1 03/17/2012   HGB 13.3 03/17/2012   HCT 39.4 03/17/2012   MCV 93 03/17/2012   PLT 269 03/17/2012    BMET    Component Value Date/Time   NA 142 03/17/2012 1016   K 3.3 (L) 03/17/2012 1016   CL 107 03/17/2012 1016   CO2 23 03/17/2012  1016   GLUCOSE 116 (H) 03/17/2012 1016   BUN 27 (H) 03/17/2012 1016   CREATININE 1.28 03/17/2012 1016   CALCIUM 9.3 03/17/2012 1016   GFRNONAA 44 (L) 03/17/2012 1016   GFRAA 54 (L) 03/17/2012 1016   CrCl cannot be calculated (Patient's most recent lab result is older than the maximum 21 days allowed.).  COAG No results found for: INR, PROTIME  Radiology No results found.   Assessment/Plan 1. Bilateral carotid artery stenosis Recommend:  Given the patient's asymptomatic subcritical stenosis no further invasive testing or surgery at this time.  Duplex ultrasound shows <70% stenosis bilaterally.  Continue antiplatelet therapy as prescribed Continue management of CAD, HTN and Hyperlipidemia Healthy heart diet,  encouraged exercise at least 4 times per week Follow up in 6 months with duplex ultrasound and physical exam   - VAS US CAROTID; Future  2. PAD (peripheral artery disease) (HCC)  Recommend:  The patient has evidence of atherosclerosis of  the lower extremities with claudication.  The patient does not voice lifestyle limiting changes at this point in time.  Noninvasive studies do not suggest clinically significant change.  No invasive studies, angiography or surgery at this time The patient should continue walking and begin a more formal exercise program.  The patient should continue antiplatelet therapy and aggressive treatment of the lipid abnormalities  No changes in the patient's medications at this time  The patient should continue wearing graduated compression socks 10-15 mmHg strength to control the mild edema.    3. Mixed hyperlipidemia Continue statin as ordered and reviewed, no changes at this time   4. Essential hypertension Continue antihypertensive medications as already ordered, these medications have been reviewed and there are no changes at this time.    Hortencia Pilar, MD  02/26/2019 2:17 PM

## 2019-03-22 DIAGNOSIS — I10 Essential (primary) hypertension: Secondary | ICD-10-CM | POA: Diagnosis not present

## 2019-03-22 DIAGNOSIS — D631 Anemia in chronic kidney disease: Secondary | ICD-10-CM | POA: Diagnosis not present

## 2019-03-22 DIAGNOSIS — R809 Proteinuria, unspecified: Secondary | ICD-10-CM | POA: Diagnosis not present

## 2019-03-22 DIAGNOSIS — N183 Chronic kidney disease, stage 3 (moderate): Secondary | ICD-10-CM | POA: Diagnosis not present

## 2019-03-22 DIAGNOSIS — N2581 Secondary hyperparathyroidism of renal origin: Secondary | ICD-10-CM | POA: Diagnosis not present

## 2019-08-29 ENCOUNTER — Other Ambulatory Visit: Payer: Self-pay

## 2019-08-29 ENCOUNTER — Ambulatory Visit (INDEPENDENT_AMBULATORY_CARE_PROVIDER_SITE_OTHER): Payer: Medicare Other | Admitting: Nurse Practitioner

## 2019-08-29 ENCOUNTER — Encounter (INDEPENDENT_AMBULATORY_CARE_PROVIDER_SITE_OTHER): Payer: Self-pay | Admitting: Nurse Practitioner

## 2019-08-29 ENCOUNTER — Ambulatory Visit (INDEPENDENT_AMBULATORY_CARE_PROVIDER_SITE_OTHER): Payer: Medicare Other

## 2019-08-29 ENCOUNTER — Encounter (INDEPENDENT_AMBULATORY_CARE_PROVIDER_SITE_OTHER): Payer: Self-pay

## 2019-08-29 VITALS — BP 154/75 | HR 64 | Resp 16 | Wt 156.6 lb

## 2019-08-29 DIAGNOSIS — R200 Anesthesia of skin: Secondary | ICD-10-CM

## 2019-08-29 DIAGNOSIS — I10 Essential (primary) hypertension: Secondary | ICD-10-CM

## 2019-08-29 DIAGNOSIS — E7800 Pure hypercholesterolemia, unspecified: Secondary | ICD-10-CM | POA: Diagnosis not present

## 2019-08-29 DIAGNOSIS — I6523 Occlusion and stenosis of bilateral carotid arteries: Secondary | ICD-10-CM | POA: Diagnosis not present

## 2019-08-29 NOTE — Progress Notes (Signed)
SUBJECTIVE:  Patient ID: Linda [name], female    DOB: 1945-12-30, 73 y.o.   MRN: [id number] Chief Complaint  Patient presents with  . Follow-up    ultrasound follow up    HPI  Linda [name] is a 73 y.o. female The patient is seen for follow up evaluation of carotid stenosis. The carotid stenosis followed by ultrasound.   The patient denies amaurosis fugax. There is no recent history of TIA symptoms or focal motor deficits.   The patient is taking enteric-coated aspirin 81 mg daily.  There is no history of migraine headaches. There is no history of seizures.  The patient has a history of coronary artery disease, no recent episodes of angina or shortness of breath.  There is a history of hyperlipidemia which is being treated with a statin.    The patient also complains of numbness when she walks within her left lower extremity.  The patient has dementia and she is a very poor historian and so her husband tries to help and fill-in, however he is unable to determine if it is pain or weakness or just true numbness.  She is also unable to tell us if it is constant or if it comes and goes or if it is better with rest.  Duplex ultrasound shows 1-39% of the right internal carotid artery and 40 to 59% of the left.  Previous study results were less than 70% although the report of the actual study is not available from outside systems.  Past Medical History:  Diagnosis Date  . Anxiety   . Benign neoplasm of colon 03/27/2014  . Brain aneurysm 2006  . Cerebrovascular disease 06/28/2014  . Chronic kidney disease   . Dementia, vascular ([place]) 06/19/2014  . Depression   . Hyperlipidemia 02/20/2019  . Pure hypercholesterolemia 03/27/2014  . Receptive aphasia   . Sleep apnea   . Stroke ([place])   . Vascular dementia ([place])   . Wears dentures     Past Surgical History:  Procedure Laterality Date  . CATARACT EXTRACTION W/PHACO Left 04/27/2018   Procedure: CATARACT EXTRACTION PHACO AND INTRAOCULAR  LENS PLACEMENT ([place]) LEFT;  Surgeon: Leandrew Koyanagi, MD;  Location: [facility];  Service: Ophthalmology;  Laterality: Left;  . CATARACT EXTRACTION W/PHACO Right 06/14/2018   Procedure: CATARACT EXTRACTION PHACO AND INTRAOCULAR LENS PLACEMENT ([place])  RIGHT;  Surgeon: Leandrew Koyanagi, MD;  Location: [facility];  Service: Ophthalmology;  Laterality: Right;  . CEREBRAL ANEURYSM REPAIR      Social History   Socioeconomic History  . Marital status: Married    Spouse name: Not on file  . Number of children: Not on file  . Years of education: Not on file  . Highest education level: Not on file  Occupational History  . Not on file  Social Needs  . Financial resource strain: Not on file  . Food insecurity    Worry: Not on file    Inability: Not on file  . Transportation needs    Medical: Not on file    Non-medical: Not on file  Tobacco Use  . Smoking status: Current Every Day Smoker    Packs/day: 1.00    Types: Cigarettes  . Smokeless tobacco: Never Used  Substance and Sexual Activity  . Alcohol use: Not Currently  . Drug use: Never  . Sexual activity: Not on file  Lifestyle  . Physical activity    Days per week: Not on file    Minutes per session:  Not on file  . Stress: Not on file  Relationships  . Social Herbalist on phone: Not on file    Gets together: Not on file    Attends religious service: Not on file    Active member of club or organization: Not on file    Attends meetings of clubs or organizations: Not on file    Relationship status: Not on file  . Intimate partner violence    Fear of current or ex partner: Not on file    Emotionally abused: Not on file    Physically abused: Not on file    Forced sexual activity: Not on file  Other Topics Concern  . Not on file  Social History Narrative  . Not on file    Family History  Problem Relation Age of Onset  . Dementia Mother   . Heart attack Brother     No Known Allergies    Review of Systems   Review of Systems: Negative Unless Checked Constitutional: [] Weight loss  [] Fever  [] Chills Cardiac: [] Chest pain   []  Atrial Fibrillation  [] Palpitations   [] Shortness of breath when laying flat   [] Shortness of breath with exertion. [] Shortness of breath at rest Vascular:  [] Pain in legs with walking   [] Pain in legs with standing [] Pain in legs when laying flat   [] Claudication    [] Pain in feet when laying flat    [] History of DVT   [] Phlebitis   [] Swelling in legs   [] Varicose veins   [] Non-healing ulcers Pulmonary:   [] Uses home oxygen   [] Productive cough   [] Hemoptysis   [] Wheeze  [] COPD   [] Asthma Neurologic:  [] Dizziness   [] Seizures  [] Blackouts [] History of stroke   [] History of TIA  [x] Aphasia   [] Temporary Blindness   [] Weakness or numbness in arm   [x] Weakness or numbness in leg Musculoskeletal:   [] Joint swelling   [] Joint pain   [] Low back pain  []  History of Knee Replacement [] Arthritis [] back Surgeries  []  Spinal Stenosis    Hematologic:  [] Easy bruising  [] Easy bleeding   [] Hypercoagulable state   [] Anemic Gastrointestinal:  [] Diarrhea   [] Vomiting  [] Gastroesophageal reflux/heartburn   [] Difficulty swallowing. [] Abdominal pain Genitourinary:  [] Chronic kidney disease   [] Difficult urination  [] Anuric   [] Blood in urine [] Frequent urination  [] Burning with urination   [] Hematuria Skin:  [] Rashes   [] Ulcers [] Wounds Psychological:  [x] History of anxiety   []  History of major depression  [x]  Memory Difficulties      OBJECTIVE:   Physical Exam  BP (!) 154/75 (BP Location: Left Arm)   Pulse 64   Resp 16   Wt 156 lb 9.6 oz (71 kg)   BMI 25.28 kg/m   Gen: WD/WN, NAD Head: [place]/AT, No temporalis wasting.  Ear/Nose/Throat: Hearing grossly intact, nares w/o erythema or drainage Eyes: PER, EOMI, sclera nonicteric.  Neck: Supple, no masses.  No JVD.  Pulmonary:  Good air movement, no use of accessory muscles.  Cardiac: RRR Vascular:  Bilateral carotid  bruits Vessel Right Left  Radial Palpable Palpable  Dorsalis Pedis Palpable Palpable  Posterior Tibial Not Palpable Not Palpable   Gastrointestinal: soft, non-distended. No guarding/no peritoneal signs.  Musculoskeletal: M/S 5/5 throughout.  No deformity or atrophy.  Neurologic: Pain and light touch intact in extremities.  Symmetrical.  Speech is fluent. Motor exam as listed above. Psychiatric: Judgment intact, Mood & affect appropriate for pt's clinical situation. Dermatologic: No Venous rashes. No Ulcers Noted.  No changes consistent with cellulitis. Lymph : No Cervical lymphadenopathy, no lichenification or skin changes of chronic lymphedema.       ASSESSMENT AND PLAN:  1. Bilateral carotid artery stenosis Recommend:  Given the patient's asymptomatic subcritical stenosis no further invasive testing or surgery at this time.  Duplex ultrasound shows 1-39% of the right internal carotid artery and 40 to 59% of the left.  Continue antiplatelet therapy as prescribed Continue management of CAD, HTN and Hyperlipidemia Healthy heart diet,  encouraged exercise at least 4 times per week   Previous studies had the percent of stenosis  2. Pure hypercholesterolemia Continue statin as ordered and reviewed, no changes at this time   3. Essential hypertension Continue antihypertensive medications as already ordered, these medications have been reviewed and there are no changes at this time.   4. Left leg numbness The patient is a poor historian and husband is able to relate that she does complain of some numbness but it is difficult to discern whether it is just numbness or weakness or actual pain when walking.  We will obtain ABIs when the patient returns in order to assess for possible peripheral artery disease given her risk factors such as hypertension, high cholesterol and smoking.   Current Outpatient Medications on File Prior to Visit  Medication Sig Dispense Refill  .  citalopram (CELEXA) 20 MG tablet Take 20 mg by mouth daily.    . clopidogrel (PLAVIX) 75 MG tablet Take by mouth.    . losartan (COZAAR) 50 MG tablet     . mirabegron ER (MYRBETRIQ) 25 MG TB24 tablet Take 25 mg by mouth daily.    . multivitamin-lutein (OCUVITE-LUTEIN) CAPS capsule Take 1 capsule by mouth daily.    Marland Kitchen oxybutynin (DITROPAN-XL) 10 MG 24 hr tablet Take 1 tablet (10 mg total) by mouth daily. 30 tablet 11  . simvastatin (ZOCOR) 80 MG tablet     . tolterodine (DETROL LA) 4 MG 24 hr capsule Take 1 capsule (4 mg total) by mouth daily. 30 capsule 11  . Vitamin D, Ergocalciferol, (DRISDOL) 50000 units CAPS capsule      No current facility-administered medications on file prior to visit.     There are no Patient Instructions on file for this visit. Return in about 6 months (around 02/26/2020).   Kris Hartmann, NP  This note was completed with Sales executive.  Any errors are purely unintentional.

## 2020-02-18 ENCOUNTER — Ambulatory Visit: Payer: Medicare Other | Attending: Internal Medicine

## 2020-02-18 DIAGNOSIS — Z23 Encounter for immunization: Secondary | ICD-10-CM

## 2020-02-18 NOTE — Progress Notes (Signed)
   Covid-19 Vaccination Clinic  Name:  Linda [name]    MRN: [id number] DOB: August 03, 1946  02/18/2020  Linda [name] was observed post Covid-19 immunization for 15 minutes without incident. She was provided with Vaccine Information Sheet and instruction to access the V-Safe system.   Linda [name] was instructed to call 911 with any severe reactions post vaccine: Marland Kitchen Difficulty breathing  . Swelling of face and throat  . A fast heartbeat  . A bad rash all over body  . Dizziness and weakness   Immunizations Administered    Name Date Dose VIS Date Route   Pfizer COVID-19 Vaccine 02/18/2020 10:24 AM 0.3 mL 11/24/2019 Intramuscular   Manufacturer: [place]   Lot: [id number]   [place]: [address]-9

## 2020-02-26 ENCOUNTER — Other Ambulatory Visit (INDEPENDENT_AMBULATORY_CARE_PROVIDER_SITE_OTHER): Payer: Self-pay | Admitting: Vascular Surgery

## 2020-02-26 DIAGNOSIS — I739 Peripheral vascular disease, unspecified: Secondary | ICD-10-CM

## 2020-02-27 ENCOUNTER — Ambulatory Visit (INDEPENDENT_AMBULATORY_CARE_PROVIDER_SITE_OTHER): Payer: Medicare Other | Admitting: Vascular Surgery

## 2020-02-27 ENCOUNTER — Encounter (INDEPENDENT_AMBULATORY_CARE_PROVIDER_SITE_OTHER): Payer: Medicare Other

## 2020-03-12 ENCOUNTER — Ambulatory Visit: Payer: Medicare Other | Attending: Internal Medicine

## 2020-03-12 DIAGNOSIS — Z23 Encounter for immunization: Secondary | ICD-10-CM

## 2020-03-12 NOTE — Progress Notes (Signed)
   Covid-19 Vaccination Clinic  Name:  Linda [name]    MRN: [id number] DOB: 29-Mar-1946  03/12/2020  [name] was observed post Covid-19 immunization for 15 minutes without incident. She was provided with Vaccine Information Sheet and instruction to access the V-Safe system.   [name] was instructed to call 911 with any severe reactions post vaccine: Marland Kitchen Difficulty breathing  . Swelling of face and throat  . A fast heartbeat  . A bad rash all over body  . Dizziness and weakness   Immunizations Administered    Name Date Dose VIS Date Route   Pfizer COVID-19 Vaccine 03/12/2020  8:39 AM 0.3 mL 11/24/2019 Intramuscular   Manufacturer: [place]   Lot: [id number]   [place]: [id number]

## 2020-04-29 ENCOUNTER — Other Ambulatory Visit: Payer: Self-pay | Admitting: *Deleted

## 2020-04-29 DIAGNOSIS — N3946 Mixed incontinence: Secondary | ICD-10-CM

## 2020-04-30 ENCOUNTER — Ambulatory Visit: Payer: Medicare Other | Admitting: Urology

## 2020-04-30 ENCOUNTER — Encounter: Payer: Self-pay | Admitting: Urology

## 2020-04-30 ENCOUNTER — Other Ambulatory Visit: Payer: Self-pay

## 2020-04-30 ENCOUNTER — Other Ambulatory Visit
Admission: RE | Admit: 2020-04-30 | Discharge: 2020-04-30 | Disposition: A | Discharge status: Home / Self Care | Payer: Medicare Other | Attending: Urology | Admitting: Urology

## 2020-04-30 VITALS — BP 179/87 | HR 66 | Ht 66.0 in | Wt 150.0 lb

## 2020-04-30 DIAGNOSIS — N3946 Mixed incontinence: Secondary | ICD-10-CM | POA: Diagnosis present

## 2020-04-30 DIAGNOSIS — R351 Nocturia: Secondary | ICD-10-CM | POA: Diagnosis not present

## 2020-04-30 LAB — URINALYSIS, COMPLETE (UACMP) WITH MICROSCOPIC
Bacteria, UA: NONE SEEN
Bilirubin Urine: NEGATIVE
Glucose, UA: NEGATIVE mg/dL
Hgb urine dipstick: NEGATIVE
Ketones, ur: NEGATIVE mg/dL
Leukocytes,Ua: NEGATIVE
Nitrite: NEGATIVE
Protein, ur: NEGATIVE mg/dL
RBC / HPF: NONE SEEN RBC/hpf (ref 0–5)
Specific Gravity, Urine: 1.02 (ref 1.005–1.030)
pH: 6 (ref 5.0–8.0)

## 2020-04-30 LAB — BLADDER SCAN AMB NON-IMAGING: Scan Result: 131

## 2020-04-30 MED ORDER — MIRABEGRON ER 50 MG PO TB24
50.0000 mg | ORAL_TABLET | Freq: Every day | ORAL | 11 refills | Status: DC
Start: 2020-04-30 — End: 2021-02-25

## 2020-04-30 NOTE — Progress Notes (Signed)
° °  04/30/2020 9:38 AM   Linda [name] [birth year] [id number]  Reason for visit: Follow up OAB  HPI: I saw [name] in urology clinic today for overactive bladder and nocturia.  She was previously followed by Dr. Vikki Ports, and was last seen over a year ago.  She is a very frail-appearing 74 year old female with past medical history notable for vascular dementia, stroke, CKD, and sleep apnea.  The patient is very confused, and most of the history is obtained from her husband today.  She was previously managed for OAB symptoms by Dr. Matilde Sprang with Myrbetriq with reportedly significant improvement.  She was unable to afford the Myrbetriq though, and was transitioned over to oxybutynin XL.  Her primary complaints today are nocturia 2-3 times per night, and some urgency and frequency, as well as stress incontinence during the day.  She is a very poor historian. She drinks Sprite and tea before bed.  She denies any gross hematuria or UTIs.  Urinalysis today is completely benign, PVR 130 mL.   Physical Exam: BP (!) 179/87    Pulse 66    Ht 5\' 6"  (1.676 m)    Wt 150 lb (68 kg)    BMI 24.21 kg/m    Constitutional: Frail-appearing, confused  Laboratory Data: Reviewed, see HPI  Assessment & Plan:   In summary, she is a 74 year old female with vascular dementia, stroke, CKD, and sleep apnea with overactive bladder symptoms and nocturia 2-3 times per night.  We discussed that overactive bladder (OAB) is not a disease, but is a symptom complex that is generally not life-threatening.  Symptoms typically include urinary urgency, frequency, and urge incontinence.  There are numerous treatment options, however there are risks and benefits with both medical and surgical management.  First-line treatment is behavioral therapies including bladder training, pelvic floor muscle training, and fluid management.  Second line treatments include oral antimuscarinics(Ditropan er, Trospium) and beta-3 agonist  (Mybetriq). There is typically a period of medication trial (4-8 weeks) to find the optimal therapy and dosing. If symptoms are bothersome despite the above management, third line options include intra-detrusor botox, peripheral tibial nerve stimulation (PTNS), and interstim (SNS). These are more invasive treatments with higher side effect profile, but may improve quality of life for patients with severe OAB symptoms.   We had a long conversation about behavioral strategies including minimizing tea, soda, and other bladder irritants, especially in the evening before bed, double voiding prior to bedtime, and compliance with CPAP for sleep apnea.  I also recommended a re-trial of Myrbetriq, as this apparently worked very well for her in the past.  One month of samples were given, as well as a coupon for Myrbetriq in the future if this works well for her.  We also had a long conversation about anticholinergics in elderly patients with confusion.  Anticholinergics are absolutely contraindicated in this patient secondary to her high fall risk and confusion.  -Behavioral strategies discussed at length, avoid bladder irritants, minimize fluids before bed, double voiding -Trial of Myrbetriq 50 mg daily, samples and coupon provided -If minimal improvement on Myrbetriq or not affordable, consider PTNS in the future  Billey Co, [facility] [address] [place], [place] [address] [phone]

## 2020-04-30 NOTE — Patient Instructions (Signed)

## 2020-05-27 ENCOUNTER — Other Ambulatory Visit: Payer: Self-pay | Admitting: *Deleted

## 2020-05-27 DIAGNOSIS — N3946 Mixed incontinence: Secondary | ICD-10-CM

## 2020-05-28 ENCOUNTER — Encounter: Payer: Self-pay | Admitting: Urology

## 2020-05-28 ENCOUNTER — Ambulatory Visit: Payer: Medicare Other | Admitting: Urology

## 2020-05-28 ENCOUNTER — Other Ambulatory Visit: Payer: Self-pay

## 2020-05-28 VITALS — BP 156/81 | HR 76 | Ht 66.0 in | Wt 148.0 lb

## 2020-05-28 DIAGNOSIS — R351 Nocturia: Secondary | ICD-10-CM

## 2020-05-28 DIAGNOSIS — N3281 Overactive bladder: Secondary | ICD-10-CM | POA: Diagnosis not present

## 2020-05-28 LAB — BLADDER SCAN AMB NON-IMAGING: Scan Result: 0

## 2020-05-28 NOTE — Progress Notes (Signed)
   05/28/2020 10:49 AM   Tawanna Cooler 12-26-1945 [id number]  Reason for visit: Follow up OAB  HPI: I saw Ms. Domingo Cocking for follow-up of OAB.  She is a very co-morbid and frail-appearing 74 year old female with history notable for vascular dementia, stroke, CKD, and sleep apnea.  At our last visit, we had changed her from oxybutynin to Myrbetriq secondary to her high fall risk and confusion risk, she does think that the Myrbetriq 50 mg daily has made a moderate improvement in her urinary urgency, frequency, and nocturia.  At our last visit we had also discussed decreasing Sprite and tea in the diet, and she has cut out tea but continues to drink Sprite.  PVR is 0 mL today.  Urinalysis was benign at our visit last month.  She denies any dysuria or gross hematuria.  She continues to be bothered by her urinary symptoms.  We again discussed behavioral strategies at length.  I was very frank with the patient that her dementia and soda intake likely are contributing to her urinary symptoms and nocturia.  I reviewed the importance of timed voiding during the day, minimizing bladder irritants, minimizing fluids after 6 PM, and voiding prior to bed.  Finally, I again reviewed the importance of her compliance with CPAP for her sleep apnea and the relationship between sleep apnea and nocturia.  Continue Myrbetriq 50 mg daily Consider PTNS if persistently bothersome symptoms RTC 9 months for symptom check   I spent 20 total minutes on the day of the encounter including pre-visit review of the medical record, face-to-face time with the patient, and post visit ordering of labs/imaging/tests.  Billey Co, [facility] [address], [place], [place] [address] [phone]

## 2020-05-28 NOTE — Patient Instructions (Signed)
1. Avoid sodas and tea 2. Urinate every 2 hours during the day 3. Minimize fluids after 6pm, urinate twice before bed   Overactive Bladder, Adult  Overactive bladder refers to a condition in which a person has a sudden need to pass urine. The person may leak urine if he or she cannot get to the bathroom fast enough (urinary incontinence). A person with this condition may also wake up several times in the night to go to the bathroom. Overactive bladder is associated with poor nerve signals between your bladder and your brain. Your bladder may get the signal to empty before it is full. You may also have very sensitive muscles that make your bladder squeeze too soon. These symptoms might interfere with daily work or social activities. What are the causes? This condition may be associated with or caused by:  Urinary tract infection.  Infection of nearby tissues, such as the prostate.  Prostate enlargement.  Surgery on the uterus or urethra.  Bladder stones, inflammation, or tumors.  Drinking too much caffeine or alcohol.  Certain medicines, especially medicines that get rid of extra fluid in the body (diuretics).  Muscle or nerve weakness, especially from: ? A spinal cord injury. ? Stroke. ? Multiple sclerosis. ? Parkinson's disease.  Diabetes.  Constipation. What increases the risk? You may be at greater risk for overactive bladder if you:  Are an older adult.  Smoke.  Are going through menopause.  Have prostate problems.  Have a neurological disease, such as stroke, dementia, Parkinson's disease, or multiple sclerosis (MS).  Eat or drink things that irritate the bladder. These include alcohol, spicy food, and caffeine.  Are overweight or obese. What are the signs or symptoms? Symptoms of this condition include:  Sudden, strong urge to urinate.  Leaking urine.  Urinating 8 or more times a day.  Waking up to urinate 2 or more times a night. How is this  diagnosed? Your health care provider may suspect overactive bladder based on your symptoms. He or she will diagnose this condition by:  A physical exam and medical history.  Blood or urine tests. You might need bladder or urine tests to help determine what is causing your overactive bladder. You might also need to see a health care provider who specializes in urinary tract problems (urologist). How is this treated? Treatment for overactive bladder depends on the cause of your condition and whether it is mild or severe. You can also make lifestyle changes at home. Options include:  Bladder training. This may include: ? Learning to control the urge to urinate by following a schedule that directs you to urinate at regular intervals (timed voiding). ? Doing Kegel exercises to strengthen your pelvic floor muscles, which support your bladder. Toning these muscles can help you control urination, even if your bladder muscles are overactive.  Special devices. This may include: ? Biofeedback, which uses sensors to help you become aware of your body's signals. ? Electrical stimulation, which uses electrodes placed inside the body (implanted) or outside the body. These electrodes send gentle pulses of electricity to strengthen the nerves or muscles that control the bladder. ? Women may use a plastic device that fits into the vagina and supports the bladder (pessary).  Medicines. ? Antibiotics to treat bladder infection. ? Antispasmodics to stop the bladder from releasing urine at the wrong time. ? Tricyclic antidepressants to relax bladder muscles. ? Injections of botulinum toxin type A directly into the bladder tissue to relax bladder muscles.  Lifestyle  changes. This may include: ? Weight loss. Talk to your health care provider about weight loss methods that would work best for you. ? Diet changes. This may include reducing how much alcohol and caffeine you consume, or drinking fluids at different  times of the day. ? Not smoking. Do not use any products that contain nicotine or tobacco, such as cigarettes and e-cigarettes. If you need help quitting, ask your health care provider.  Surgery. ? A device may be implanted to help manage the nerve signals that control urination. ? An electrode may be implanted to stimulate electrical signals in the bladder. ? A procedure may be done to change the shape of the bladder. This is done only in very severe cases. Follow these instructions at home: Lifestyle  Make any diet or lifestyle changes that are recommended by your health care provider. These may include: ? Drinking less fluid or drinking fluids at different times of the day. ? Cutting down on caffeine or alcohol. ? Doing Kegel exercises. ? Losing weight if needed. ? Eating a healthy and balanced diet to prevent constipation. This may include:  Eating foods that are high in fiber, such as fresh fruits and vegetables, whole grains, and beans.  Limiting foods that are high in fat and processed sugars, such as fried and sweet foods. General instructions  Take over-the-counter and prescription medicines only as told by your health care provider.  If you were prescribed an antibiotic medicine, take it as told by your health care provider. Do not stop taking the antibiotic even if you start to feel better.  Use any implants or pessary as told by your health care provider.  If needed, wear pads to absorb urine leakage.  Keep a journal or log to track how much and when you drink and when you feel the need to urinate. This will help your health care provider monitor your condition.  Keep all follow-up visits as told by your health care provider. This is important. Contact a health care provider if:  You have a fever.  Your symptoms do not get better with treatment.  Your pain and discomfort get worse.  You have more frequent urges to urinate. Get help right away if:  You are not  able to control your bladder. Summary  Overactive bladder refers to a condition in which a person has a sudden need to pass urine.  Several conditions may lead to an overactive bladder.  Treatment for overactive bladder depends on the cause and severity of your condition.  Follow your health care provider's instructions about lifestyle changes, doing Kegel exercises, keeping a journal, and taking medicines. This information is not intended to replace advice given to you by your health care provider. Make sure you discuss any questions you have with your health care provider. Document Revised: 03/23/2019 Document Reviewed: 12/16/2017 Elsevier Patient Education  [place].

## 2020-06-14 ENCOUNTER — Emergency Department: Payer: Medicare Other

## 2020-06-14 ENCOUNTER — Other Ambulatory Visit: Payer: Self-pay

## 2020-06-14 ENCOUNTER — Observation Stay
Admission: EM | Admit: 2020-06-14 | Discharge: 2020-06-15 | Disposition: A | Discharge status: Home Health | Payer: Medicare Other | Attending: Internal Medicine | Admitting: Internal Medicine

## 2020-06-14 DIAGNOSIS — F17210 Nicotine dependence, cigarettes, uncomplicated: Secondary | ICD-10-CM | POA: Diagnosis not present

## 2020-06-14 DIAGNOSIS — E7800 Pure hypercholesterolemia, unspecified: Secondary | ICD-10-CM | POA: Insufficient documentation

## 2020-06-14 DIAGNOSIS — G4733 Obstructive sleep apnea (adult) (pediatric): Secondary | ICD-10-CM | POA: Insufficient documentation

## 2020-06-14 DIAGNOSIS — F419 Anxiety disorder, unspecified: Secondary | ICD-10-CM | POA: Insufficient documentation

## 2020-06-14 DIAGNOSIS — N179 Acute kidney failure, unspecified: Secondary | ICD-10-CM

## 2020-06-14 DIAGNOSIS — Z7902 Long term (current) use of antithrombotics/antiplatelets: Secondary | ICD-10-CM | POA: Diagnosis not present

## 2020-06-14 DIAGNOSIS — I639 Cerebral infarction, unspecified: Secondary | ICD-10-CM | POA: Diagnosis not present

## 2020-06-14 DIAGNOSIS — N1831 Chronic kidney disease, stage 3a: Secondary | ICD-10-CM | POA: Diagnosis not present

## 2020-06-14 DIAGNOSIS — E785 Hyperlipidemia, unspecified: Secondary | ICD-10-CM | POA: Diagnosis not present

## 2020-06-14 DIAGNOSIS — F32A Depression, unspecified: Secondary | ICD-10-CM | POA: Diagnosis present

## 2020-06-14 DIAGNOSIS — Z79899 Other long term (current) drug therapy: Secondary | ICD-10-CM | POA: Diagnosis not present

## 2020-06-14 DIAGNOSIS — I10 Essential (primary) hypertension: Secondary | ICD-10-CM | POA: Diagnosis present

## 2020-06-14 DIAGNOSIS — I130 Hypertensive heart and chronic kidney disease with heart failure and stage 1 through stage 4 chronic kidney disease, or unspecified chronic kidney disease: Secondary | ICD-10-CM | POA: Insufficient documentation

## 2020-06-14 DIAGNOSIS — Z20822 Contact with and (suspected) exposure to covid-19: Secondary | ICD-10-CM | POA: Diagnosis not present

## 2020-06-14 DIAGNOSIS — G939 Disorder of brain, unspecified: Secondary | ICD-10-CM | POA: Insufficient documentation

## 2020-06-14 DIAGNOSIS — F329 Major depressive disorder, single episode, unspecified: Secondary | ICD-10-CM | POA: Insufficient documentation

## 2020-06-14 DIAGNOSIS — G9389 Other specified disorders of brain: Secondary | ICD-10-CM

## 2020-06-14 DIAGNOSIS — F0150 Vascular dementia without behavioral disturbance: Secondary | ICD-10-CM | POA: Insufficient documentation

## 2020-06-14 DIAGNOSIS — Z720 Tobacco use: Secondary | ICD-10-CM | POA: Diagnosis not present

## 2020-06-14 DIAGNOSIS — I5022 Chronic systolic (congestive) heart failure: Secondary | ICD-10-CM | POA: Diagnosis not present

## 2020-06-14 DIAGNOSIS — R2681 Unsteadiness on feet: Secondary | ICD-10-CM | POA: Diagnosis not present

## 2020-06-14 DIAGNOSIS — R4701 Aphasia: Secondary | ICD-10-CM | POA: Diagnosis not present

## 2020-06-14 DIAGNOSIS — I69320 Aphasia following cerebral infarction: Secondary | ICD-10-CM | POA: Diagnosis not present

## 2020-06-14 DIAGNOSIS — N183 Chronic kidney disease, stage 3 unspecified: Secondary | ICD-10-CM | POA: Diagnosis present

## 2020-06-14 LAB — COMPREHENSIVE METABOLIC PANEL
ALT: 10 U/L (ref 0–44)
AST: 18 U/L (ref 15–41)
Albumin: 3.7 g/dL (ref 3.5–5.0)
Alkaline Phosphatase: 23 U/L — ABNORMAL LOW (ref 38–126)
Anion gap: 8 (ref 5–15)
BUN: 27 mg/dL — ABNORMAL HIGH (ref 8–23)
CO2: 25 mmol/L (ref 22–32)
Calcium: 9.2 mg/dL (ref 8.9–10.3)
Chloride: 105 mmol/L (ref 98–111)
Creatinine, Ser: 1.7 mg/dL — ABNORMAL HIGH (ref 0.44–1.00)
GFR calc Af Amer: 34 mL/min — ABNORMAL LOW (ref >60)
GFR calc non Af Amer: 29 mL/min — ABNORMAL LOW (ref >60)
Glucose, Bld: 117 mg/dL — ABNORMAL HIGH (ref 70–99)
Potassium: 4 mmol/L (ref 3.5–5.1)
Sodium: 138 mmol/L (ref 135–145)
Total Bilirubin: 0.9 mg/dL (ref 0.3–1.2)
Total Protein: 7.8 g/dL (ref 6.5–8.1)

## 2020-06-14 LAB — CBC
HCT: 37 % (ref 36.0–46.0)
Hemoglobin: 12.4 g/dL (ref 12.0–15.0)
MCH: 31.7 pg (ref 26.0–34.0)
MCHC: 33.5 g/dL (ref 30.0–36.0)
MCV: 94.6 fL (ref 80.0–100.0)
Platelets: 233 10*3/uL (ref 150–400)
RBC: 3.91 MIL/uL (ref 3.87–5.11)
RDW: 13.7 % (ref 11.5–15.5)
WBC: 8.3 10*3/uL (ref 4.0–10.5)
nRBC: 0 % (ref 0.0–0.2)

## 2020-06-14 LAB — DIFFERENTIAL
Abs Immature Granulocytes: 0.04 10*3/uL (ref 0.00–0.07)
Basophils Absolute: 0.1 10*3/uL (ref 0.0–0.1)
Basophils Relative: 1 %
Eosinophils Absolute: 0.2 10*3/uL (ref 0.0–0.5)
Eosinophils Relative: 2 %
Immature Granulocytes: 1 %
Lymphocytes Relative: 22 %
Lymphs Abs: 1.8 10*3/uL (ref 0.7–4.0)
Monocytes Absolute: 0.6 10*3/uL (ref 0.1–1.0)
Monocytes Relative: 8 %
Neutro Abs: 5.5 10*3/uL (ref 1.7–7.7)
Neutrophils Relative %: 66 %

## 2020-06-14 LAB — GLUCOSE, CAPILLARY: Glucose-Capillary: 101 mg/dL — ABNORMAL HIGH (ref 70–99)

## 2020-06-14 LAB — APTT: aPTT: 30 seconds (ref 24–36)

## 2020-06-14 LAB — SARS CORONAVIRUS 2 BY RT PCR (HOSPITAL ORDER, PERFORMED IN ~~LOC~~ HOSPITAL LAB): SARS Coronavirus 2: NEGATIVE

## 2020-06-14 LAB — BRAIN NATRIURETIC PEPTIDE: B Natriuretic Peptide: 71.3 pg/mL (ref 0.0–100.0)

## 2020-06-14 LAB — PROTIME-INR
INR: 1 (ref 0.8–1.2)
Prothrombin Time: 12.4 seconds (ref 11.4–15.2)

## 2020-06-14 MED ORDER — LABETALOL HCL 5 MG/ML IV SOLN
10.0000 mg | Freq: Once | INTRAVENOUS | Status: AC
  Administered 2020-06-15: 10 mg via INTRAVENOUS
  Filled 2020-06-14: qty 4

## 2020-06-14 MED ORDER — STROKE: EARLY STAGES OF RECOVERY BOOK: Freq: Once | Status: AC

## 2020-06-14 MED ORDER — HYDRALAZINE HCL 20 MG/ML IJ SOLN
10.0000 mg | Freq: Once | INTRAMUSCULAR | Status: AC
  Administered 2020-06-14: 10 mg via INTRAVENOUS
  Filled 2020-06-14: qty 1

## 2020-06-14 MED ORDER — CLOPIDOGREL BISULFATE 75 MG PO TABS
75.0000 mg | ORAL_TABLET | Freq: Every day | ORAL | Status: DC
  Administered 2020-06-15: 75 mg via ORAL
  Filled 2020-06-14: qty 1

## 2020-06-14 MED ORDER — OCUVITE-LUTEIN PO CAPS
1.0000 | ORAL_CAPSULE | Freq: Every day | ORAL | Status: DC
  Administered 2020-06-15: 10:00:00 1 via ORAL
  Filled 2020-06-14: qty 1

## 2020-06-14 MED ORDER — ACETAMINOPHEN 325 MG PO TABS: 650.0000 mg | ORAL_TABLET | Freq: Four times a day (QID) | ORAL | Status: DC | PRN

## 2020-06-14 MED ORDER — ONDANSETRON HCL 4 MG/2ML IJ SOLN: 4.0000 mg | Freq: Three times a day (TID) | INTRAMUSCULAR | Status: DC | PRN

## 2020-06-14 MED ORDER — SODIUM CHLORIDE 0.9 % IV BOLUS
500.0000 mL | Freq: Once | INTRAVENOUS | Status: AC
  Administered 2020-06-14: 500 mL via INTRAVENOUS

## 2020-06-14 MED ORDER — ATORVASTATIN CALCIUM 20 MG PO TABS
40.0000 mg | ORAL_TABLET | Freq: Every day | ORAL | Status: DC
  Administered 2020-06-14: 40 mg via ORAL
  Filled 2020-06-14: qty 2

## 2020-06-14 MED ORDER — MIRABEGRON ER 50 MG PO TB24
50.0000 mg | ORAL_TABLET | Freq: Every day | ORAL | Status: DC
  Administered 2020-06-15: 10:00:00 50 mg via ORAL
  Filled 2020-06-14: qty 1

## 2020-06-14 MED ORDER — SODIUM CHLORIDE 0.9% FLUSH: 3.0000 mL | Freq: Once | INTRAVENOUS | Status: DC

## 2020-06-14 MED ORDER — NICOTINE 21 MG/24HR TD PT24
21.0000 mg | MEDICATED_PATCH | Freq: Every day | TRANSDERMAL | Status: DC
  Administered 2020-06-14 – 2020-06-15 (×2): 21 mg via TRANSDERMAL
  Filled 2020-06-14 (×2): qty 1

## 2020-06-14 MED ORDER — GADOBUTROL 1 MMOL/ML IV SOLN
6.0000 mL | Freq: Once | INTRAVENOUS | Status: AC | PRN
  Administered 2020-06-14: 6 mL via INTRAVENOUS

## 2020-06-14 MED ORDER — IOHEXOL 350 MG/ML SOLN
60.0000 mL | Freq: Once | INTRAVENOUS | Status: AC | PRN
  Administered 2020-06-14: 60 mL via INTRAVENOUS

## 2020-06-14 MED ORDER — SENNOSIDES-DOCUSATE SODIUM 8.6-50 MG PO TABS: 1.0000 | ORAL_TABLET | Freq: Every evening | ORAL | Status: DC | PRN

## 2020-06-14 MED ORDER — ENOXAPARIN SODIUM 30 MG/0.3ML ~~LOC~~ SOLN
30.0000 mg | SUBCUTANEOUS | Status: DC
  Administered 2020-06-14: 30 mg via SUBCUTANEOUS
  Filled 2020-06-14: qty 0.3

## 2020-06-14 MED ORDER — CITALOPRAM HYDROBROMIDE 20 MG PO TABS
20.0000 mg | ORAL_TABLET | Freq: Every day | ORAL | Status: DC
  Administered 2020-06-15: 10:00:00 20 mg via ORAL
  Filled 2020-06-14: qty 1

## 2020-06-14 MED ORDER — HYDRALAZINE HCL 20 MG/ML IJ SOLN
5.0000 mg | INTRAMUSCULAR | Status: DC | PRN
  Administered 2020-06-14: 5 mg via INTRAVENOUS
  Filled 2020-06-14: qty 1

## 2020-06-14 NOTE — ED Triage Notes (Addendum)
Pt arrives after being referred from [facility] for reports of facial droop and aphasia. Per pt's husband the pt was normal Wednesday night but woke up with symptoms on Thursday morning. Per husband "we couldn't understand nothing". No obvious facial drooping noted per this RN during Dr. Sheral Flow assessment, pt with slurred speech. Pt in NAD, skin warm and dry. Husband reports pt with hx of brain aneurysm in 2006.

## 2020-06-14 NOTE — Consult Note (Signed)
Reason for Consult: aphasia Requesting Physician: Dr. Tomie China  CC: Tomie China   HPI: Linda [name] is an 74 y.o. female with medical history significant of hypertension, hyperlipidemia, stroke, depression, anxiety, vascular dementia, OSA, CKD-3, s/p of brain aneurysm repair, tobacco abuse, sCHF with EF 30%, who presents with aphasia that started over 24hrs prior to presentation.    Past Medical History:  Diagnosis Date  . Anxiety   . Benign neoplasm of colon 03/27/2014  . Brain aneurysm 2006  . Cerebrovascular disease 06/28/2014  . Chronic kidney disease   . Dementia, vascular ([place]) 06/19/2014  . Depression   . Hyperlipidemia 02/20/2019  . Pure hypercholesterolemia 03/27/2014  . Receptive aphasia   . Sleep apnea   . Stroke ([place])   . Vascular dementia ([place])   . Wears dentures     Past Surgical History:  Procedure Laterality Date  . CATARACT EXTRACTION W/PHACO Left 04/27/2018   Procedure: CATARACT EXTRACTION PHACO AND INTRAOCULAR LENS PLACEMENT ([place]) LEFT;  Surgeon: Leandrew Koyanagi, MD;  Location: [facility];  Service: Ophthalmology;  Laterality: Left;  . CATARACT EXTRACTION W/PHACO Right 06/14/2018   Procedure: CATARACT EXTRACTION PHACO AND INTRAOCULAR LENS PLACEMENT ([place])  RIGHT;  Surgeon: Leandrew Koyanagi, MD;  Location: [facility];  Service: Ophthalmology;  Laterality: Right;  . CEREBRAL ANEURYSM REPAIR      Family History  Problem Relation Age of Onset  . Dementia Mother   . Heart attack Brother     Social History:  reports that she has been smoking cigarettes. She has been smoking about 1.00 pack per day. She has never used smokeless tobacco. She reports previous alcohol use. She reports that she does not use drugs.  No Known Allergies  Medications: I have reviewed the patient's current medications.  ROS: History obtained from the patient  General ROS: negative for - chills, fatigue, fever, night sweats, weight gain or weight loss Psychological ROS:  negative for - behavioral disorder, hallucinations, memory difficulties, mood swings or suicidal ideation Ophthalmic ROS: negative for - blurry vision, double vision, eye pain or loss of vision ENT ROS: negative for - epistaxis, nasal discharge, oral lesions, sore throat, tinnitus or vertigo Allergy and Immunology ROS: negative for - hives or itchy/watery eyes Hematological and Lymphatic ROS: negative for - bleeding problems, bruising or swollen lymph nodes Endocrine ROS: negative for - galactorrhea, hair pattern changes, polydipsia/polyuria or temperature intolerance Respiratory ROS: negative for - cough, hemoptysis, shortness of breath or wheezing Cardiovascular ROS: negative for - chest pain, dyspnea on exertion, edema or irregular heartbeat Gastrointestinal ROS: negative for - abdominal pain, diarrhea, hematemesis, nausea/vomiting or stool incontinence Genito-Urinary ROS: negative for - dysuria, hematuria, incontinence or urinary frequency/urgency Musculoskeletal ROS: negative for - joint swelling or muscular weakness Neurological ROS: as noted in HPI Dermatological ROS: negative for rash and skin lesion changes  Physical Examination: Blood pressure (!) 199/87, pulse 64, temperature 98.3 F (36.8 C), temperature source Oral, resp. rate 16, height 5\' 6"  (1.676 m), weight 63.5 kg, SpO2 97 %.   Neurological Examination   Mental Status: Alert, oriented, thought content appropriate.  Periods of word finding difficulty  Cranial Nerves: II: Discs flat bilaterally; Visual fields grossly normal, pupils equal, round, reactive to light and accommodation III,IV, VI: ptosis not present, extra-ocular motions intact bilaterally V,VII: smile symmetric, facial light touch sensation normal bilaterally VIII: hearing normal bilaterally IX,X: gag reflex present XI: bilateral shoulder shrug XII: midline tongue extension Motor: Right : Upper extremity   5/5    Left:  Upper extremity   5/5  Lower  extremity   5/5     Lower extremity   5/5 Tone and bulk:normal tone throughout; no atrophy noted Sensory: Pinprick and light touch intact throughout, bilaterally Deep Tendon Reflexes: 1+ and symmetric throughout    Laboratory Studies:   Basic Metabolic Panel: Recent Labs  Lab 06/14/20 1127  NA 138  K 4.0  CL 105  CO2 25  GLUCOSE 117*  BUN 27*  CREATININE 1.70*  CALCIUM 9.2    Liver Function Tests: Recent Labs  Lab 06/14/20 1127  AST 18  ALT 10  ALKPHOS 23*  BILITOT 0.9  PROT 7.8  ALBUMIN 3.7   No results for input(s): LIPASE, AMYLASE in the last 168 hours. No results for input(s): AMMONIA in the last 168 hours.  CBC: Recent Labs  Lab 06/14/20 1127  WBC 8.3  NEUTROABS 5.5  HGB 12.4  HCT 37.0  MCV 94.6  PLT 233    Cardiac Enzymes: No results for input(s): CKTOTAL, CKMB, CKMBINDEX, TROPONINI in the last 168 hours.  BNP: Invalid input(s): POCBNP  CBG: Recent Labs  Lab 06/14/20 1132  GLUCAP 101*    Microbiology: Results for orders placed or performed during the hospital encounter of 06/14/20  SARS Coronavirus 2 by RT PCR (hospital order, performed in [facility] hospital lab) Nasopharyngeal Nasopharyngeal Swab     Status: None   Collection Time: 06/14/20 11:33 AM   Specimen: Nasopharyngeal Swab  Result Value Ref Range Status   SARS Coronavirus 2 NEGATIVE NEGATIVE Final    Comment: (NOTE) SARS-CoV-2 target nucleic acids are NOT DETECTED.  The SARS-CoV-2 RNA is generally detectable in upper and lower respiratory specimens during the acute phase of infection. The lowest concentration of SARS-CoV-2 viral copies this assay can detect is 250 copies / mL. A negative result does not preclude SARS-CoV-2 infection and should not be used as the sole basis for treatment or other patient management decisions.  A negative result may occur with improper specimen collection / handling, submission of specimen other than nasopharyngeal swab, presence of viral  mutation(s) within the areas targeted by this assay, and inadequate number of viral copies (<250 copies / mL). A negative result must be combined with clinical observations, patient history, and epidemiological information.  Fact Sheet for Patients:   StrictlyIdeas.no  Fact Sheet for Healthcare Providers: BankingDealers.co.za  This test is not yet approved or  cleared by the [place] FDA and has been authorized for detection and/or diagnosis of SARS-CoV-2 by FDA under an Emergency Use Authorization (EUA).  This EUA will remain in effect (meaning this test can be used) for the duration of the COVID-19 declaration under Section 564(b)(1) of the Act, 21 U.S.C. section 360bbb-3(b)(1), unless the authorization is terminated or revoked sooner.  Performed at [facility], [address] [place],  [address]     Coagulation Studies: Recent Labs    06/14/20 1127  LABPROT 12.4  INR 1.0    Urinalysis: No results for input(s): COLORURINE, LABSPEC, PHURINE, GLUCOSEU, HGBUR, BILIRUBINUR, KETONESUR, PROTEINUR, UROBILINOGEN, NITRITE, LEUKOCYTESUR in the last 168 hours.  Invalid input(s): APPERANCEUR  Lipid Panel:  No results found for: CHOL, TRIG, HDL, CHOLHDL, VLDL, LDLCALC  HgbA1C: No results found for: HGBA1C  Urine Drug Screen:  No results found for: LABOPIA, COCAINSCRNUR, LABBENZ, AMPHETMU, THCU, LABBARB  Alcohol Level: No results for input(s): ETH in the last 168 hours.  Other results: EKG: normal EKG, normal sinus rhythm, unchanged from previous tracings.  Imaging: CT  Angio Head W or Wo Contrast  Result Date: 06/14/2020 CLINICAL DATA:  Stroke. Facial droop and aphasia. History of aneurysm treatment EXAM: CT ANGIOGRAPHY HEAD AND NECK TECHNIQUE: Multidetector CT imaging of the head and neck was performed using the standard protocol during bolus administration of intravenous contrast. Multiplanar CT image  reconstructions and MIPs were obtained to evaluate the vascular anatomy. Carotid stenosis measurements (when applicable) are obtained utilizing NASCET criteria, using the distal internal carotid diameter as the denominator. CONTRAST:  53mL OMNIPAQUE IOHEXOL 350 MG/ML SOLN COMPARISON:  CT head 03/17/2012 FINDINGS: CT HEAD FINDINGS Brain: Progressive atrophy. Extensive chronic microvascular ischemic change throughout the white matter with progression. Aneurysm coiling left MCA bifurcation region with streak artifact. Chronic encephalomalacia left temporal lobe with volume loss and enlargement of the left temporal horn similar to the prior study. Negative for acute infarct or hemorrhage. Suprasellar mass lesion measuring 5 x 6 mm. This shows homogeneous enhancement. The lesion is best seen on sagittal images. Vascular: Negative for hyperdense vessel Skull: Negative Sinuses: Visualized paranasal sinuses clear. Orbits: Bilateral cataract extraction Review of the MIP images confirms the above findings CTA NECK FINDINGS Aortic arch: Atherosclerotic calcification aortic arch. Left vertebral artery origin from the arch. Atherosclerotic calcification and mild stenosis proximal left subclavian artery. Mild stenosis at the origin of the left common carotid artery. Right carotid system: Mild atherosclerotic disease right carotid bifurcation without significant stenosis. Left carotid system: Atherosclerotic disease left carotid bifurcation. Left internal carotid artery narrowed to 2.4 mm in minimal diameter corresponding to 47% stenosis. Vertebral arteries: Left vertebral artery dominant and patent without significant stenosis. Small right vertebral artery ends in PICA. Skeleton: Cervical degenerative changes with prominent facet degeneration bilaterally right greater than left. No acute skeletal abnormality. Other neck: Negative for mass or adenopathy in the neck. Upper chest: Apical emphysema and subpleural blebs. Apical  scarring bilaterally. No acute abnormality. Atherosclerotic aortic arch. Review of the MIP images confirms the above findings CTA HEAD FINDINGS Anterior circulation: Mild atherosclerotic disease in the cavernous carotid bilaterally without stenosis. Anterior and middle cerebral arteries patent bilaterally without stenosis. Aneurysm coiling left MCA bifurcation. There is streak artifact from the coil however no aneurysm is identified filling with contrast. Posterior circulation: Left vertebral artery supplies the basilar. Left PICA patent. Right vertebral artery ends in PICA. Basilar patent. AICA, superior cerebellar arteries patent bilaterally. Fetal origin of the posterior cerebral artery bilaterally without stenosis or occlusion. No aneurysm in the posterior circulation. Venous sinuses: Normal venous enhancement Anatomic variants: None Review of the MIP images confirms the above findings IMPRESSION: 1. Negative for acute infarct or hemorrhage 2. Progression of atrophy and chronic ischemic change since 2013. 3. 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 No other aneurysm is identified 4. Negative for intracranial large vessel occlusion 5. Mild atherosclerotic disease the carotid bifurcation bilaterally. No significant right carotid stenosis. 47% diameter stenosis proximal left internal carotid artery. No significant vertebral artery stenosis. Right vertebral artery ends in PICA. 6. 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 Recommend MRI brain without and with contrast. Electronically Signed   By: Franchot Gallo M.D.   On: 06/14/2020 13:07   CT Angio Neck W and/or Wo Contrast  Result Date: 06/14/2020 CLINICAL DATA:  Stroke. Facial droop and aphasia. History of aneurysm treatment EXAM: CT ANGIOGRAPHY HEAD AND NECK TECHNIQUE: Multidetector CT imaging of the head and neck was performed using the standard protocol during bolus  administration of  intravenous contrast. Multiplanar CT image reconstructions and MIPs were obtained to evaluate the vascular anatomy. Carotid stenosis measurements (when applicable) are obtained utilizing NASCET criteria, using the distal internal carotid diameter as the denominator. CONTRAST:  35mL OMNIPAQUE IOHEXOL 350 MG/ML SOLN COMPARISON:  CT head 03/17/2012 FINDINGS: CT HEAD FINDINGS Brain: Progressive atrophy. Extensive chronic microvascular ischemic change throughout the white matter with progression. Aneurysm coiling left MCA bifurcation region with streak artifact. Chronic encephalomalacia left temporal lobe with volume loss and enlargement of the left temporal horn similar to the prior study. Negative for acute infarct or hemorrhage. Suprasellar mass lesion measuring 5 x 6 mm. This shows homogeneous enhancement. The lesion is best seen on sagittal images. Vascular: Negative for hyperdense vessel Skull: Negative Sinuses: Visualized paranasal sinuses clear. Orbits: Bilateral cataract extraction Review of the MIP images confirms the above findings CTA NECK FINDINGS Aortic arch: Atherosclerotic calcification aortic arch. Left vertebral artery origin from the arch. Atherosclerotic calcification and mild stenosis proximal left subclavian artery. Mild stenosis at the origin of the left common carotid artery. Right carotid system: Mild atherosclerotic disease right carotid bifurcation without significant stenosis. Left carotid system: Atherosclerotic disease left carotid bifurcation. Left internal carotid artery narrowed to 2.4 mm in minimal diameter corresponding to 47% stenosis. Vertebral arteries: Left vertebral artery dominant and patent without significant stenosis. Small right vertebral artery ends in PICA. Skeleton: Cervical degenerative changes with prominent facet degeneration bilaterally right greater than left. No acute skeletal abnormality. Other neck: Negative for mass or adenopathy in the neck.  Upper chest: Apical emphysema and subpleural blebs. Apical scarring bilaterally. No acute abnormality. Atherosclerotic aortic arch. Review of the MIP images confirms the above findings CTA HEAD FINDINGS Anterior circulation: Mild atherosclerotic disease in the cavernous carotid bilaterally without stenosis. Anterior and middle cerebral arteries patent bilaterally without stenosis. Aneurysm coiling left MCA bifurcation. There is streak artifact from the coil however no aneurysm is identified filling with contrast. Posterior circulation: Left vertebral artery supplies the basilar. Left PICA patent. Right vertebral artery ends in PICA. Basilar patent. AICA, superior cerebellar arteries patent bilaterally. Fetal origin of the posterior cerebral artery bilaterally without stenosis or occlusion. No aneurysm in the posterior circulation. Venous sinuses: Normal venous enhancement Anatomic variants: None Review of the MIP images confirms the above findings IMPRESSION: 1. Negative for acute infarct or hemorrhage 2. Progression of atrophy and chronic ischemic change since 2013. 3. 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 No other aneurysm is identified 4. Negative for intracranial large vessel occlusion 5. Mild atherosclerotic disease the carotid bifurcation bilaterally. No significant right carotid stenosis. 47% diameter stenosis proximal left internal carotid artery. No significant vertebral artery stenosis. Right vertebral artery ends in PICA. 6. 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 Recommend MRI brain without and with contrast. Electronically Signed   By: Franchot Gallo M.D.   On: 06/14/2020 13:07     Assessment/Plan:  74 y.o. female with medical history significant of hypertension, hyperlipidemia, stroke, depression, anxiety, vascular dementia, OSA, CKD-3, s/p of brain aneurysm repair, tobacco abuse, sCHF with EF 30%, who presents  with aphasia that started over 24hrs prior to presentation.    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  The mass likely incidental finding and very small that  can be followed as out pt - likely d/c planning tomorrow after stroke work up is complete 06/14/2020, 4:00 PM

## 2020-06-14 NOTE — ED Notes (Signed)
Pt otf for imaging 

## 2020-06-14 NOTE — ED Notes (Signed)
attempted to call report

## 2020-06-14 NOTE — ED Notes (Signed)
PT in imaging

## 2020-06-14 NOTE — H&P (Addendum)
History and Physical    Linda [name] AYT:[id number] DOB: 11-21-1946 DOA: 06/14/2020  Referring MD/NP/PA:   PCP: Sofie Hartigan, MD   Patient coming from:  The patient is coming from home.  At baseline, pt is independent for most of ADL.        Chief Complaint: aphasia  HPI: Linda [name] is a 74 y.o. female with medical history significant of hypertension, hyperlipidemia, stroke, depression, anxiety, vascular dementia, OSA, CKD-3, s/p of brain aneurysm repair, tobacco abuse, sCHF with EF 30%, who presents with aphasia.  Per her husband, pt was last known normal Wednesday night, but woke up with aphasia in Thursday morning.  Husband states that patient has aphasia from previous stroke, but her aphasia is much worse since yesterday.  He could not understand what patient is saying to hime which is new.  Patient does not have unilateral weakness, numbness or tingling extremities.  No obvious facial droop.  No vision loss or hearing loss.  Patient does not have chest pain, shortness breath, cough, fever or chills.  No nausea vomiting, diarrhea, abdominal pain, symptoms of UTI.  Husband states that patient ran out of Plavix for a while.  ED Course: pt was found to have WBC 8.3, negative COVID-19 PCR, slightly worsening renal function, INR 1.0, PTT 30, temperature normal, blood pressure 166/84, heart rate 69, RR 18, oxygen saturation 94% on room air.  Patient is placed on progressive bed for observation.  Neurologist, Dr. Irish Elders is consulted.  CTA of neck and head: 1. Negative for acute infarct or hemorrhage 2. Progression of atrophy and chronic ischemic change since 2013. 3. 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 No other aneurysm is identified 4. Negative for intracranial large vessel occlusion 5. Mild atherosclerotic disease the carotid bifurcation bilaterally. No significant right carotid stenosis. 47% diameter stenosis proximal left internal carotid artery.  No significant vertebral artery stenosis. Right vertebral artery ends in PICA. 6. 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 Recommend MRI brain without and with contrast.  MRI-brain w/ and w/o contrat: 1. Acute infarct in the left middle frontal gyrus. 2. Atrophy and extensive chronic ischemic change. Chronic hemorrhagic infarct left temporal lobe. 3. 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 Given the enhancing nature, Rathke's cleft cyst not considered likely. Possibilities include sarcoidosis, lymphoma, metastatic disease, craniopharyngioma, and other inflammatory processes of the infundibulum.  Review of Systems: Could not be reviewed accurately due to aphasia.  Allergy: No Known Allergies  Past Medical History:  Diagnosis Date  . Anxiety   . Benign neoplasm of colon 03/27/2014  . Brain aneurysm 2006  . Cerebrovascular disease 06/28/2014  . Chronic kidney disease   . Dementia, vascular ([place]) 06/19/2014  . Depression   . Hyperlipidemia 02/20/2019  . Pure hypercholesterolemia 03/27/2014  . Receptive aphasia   . Sleep apnea   . Stroke ([place])   . Vascular dementia ([place])   . Wears dentures     Past Surgical History:  Procedure Laterality Date  . CATARACT EXTRACTION W/PHACO Left 04/27/2018   Procedure: CATARACT EXTRACTION PHACO AND INTRAOCULAR LENS PLACEMENT ([place]) LEFT;  Surgeon: Leandrew Koyanagi, MD;  Location: [facility];  Service: Ophthalmology;  Laterality: Left;  . CATARACT EXTRACTION W/PHACO Right 06/14/2018   Procedure: CATARACT EXTRACTION PHACO AND INTRAOCULAR LENS PLACEMENT ([place])  RIGHT;  Surgeon: Leandrew Koyanagi, MD;  Location: [facility];  Service: Ophthalmology;  Laterality: Right;  .  CEREBRAL ANEURYSM REPAIR      Social History:  reports that she has been smoking cigarettes. She has been smoking about 1.00 pack per day. She has never used smokeless tobacco. She reports previous  alcohol use. She reports that she does not use drugs.  Family History:  Family History  Problem Relation Age of Onset  . Dementia Mother   . Heart attack Brother      Prior to Admission medications   Medication Sig Start Date End Date Taking? Authorizing Provider  citalopram (CELEXA) 20 MG tablet Take 20 mg by mouth daily.    [provider]  clopidogrel (PLAVIX) 75 MG tablet Take by mouth. 03/21/18   [provider]  losartan (COZAAR) 50 MG tablet  02/17/18   [provider]  mirabegron ER (MYRBETRIQ) 50 MG TB24 tablet Take 1 tablet (50 mg total) by mouth daily. 04/30/20   Billey Co, MD  multivitamin-lutein [facility]) CAPS capsule Take 1 capsule by mouth daily.    [provider]  simvastatin (ZOCOR) 80 MG tablet  03/15/18   [provider]  vitamin B-12 (CYANOCOBALAMIN) 1000 MCG tablet Take 1,000 mcg by mouth daily.    [provider]  Vitamin D, Ergocalciferol, (DRISDOL) 50000 units CAPS capsule  03/15/18   [provider]    Physical Exam: Vitals:   06/14/20 1215 06/14/20 1300 06/14/20 1633 06/14/20 1709  BP: (!) 190/77 (!) 199/87 (!) 223/71 (!) 205/74  Pulse: 65 64 66   Resp: 17 16 18    Temp:      TempSrc:      SpO2: 94% 97% 92%   Weight:      Height:       General: Not in acute distress HEENT:       Eyes: PERRL, EOMI, no scleral icterus.       ENT: No discharge from the ears and nose, no pharynx injection, no tonsillar enlargement.        Neck: No JVD, no bruit, no mass felt. Heme: No neck lymph node enlargement. Cardiac: S1/S2, RRR, No murmurs, No gallops or rubs. Respiratory: No rales, wheezing, rhonchi or rubs. GI: Soft, nondistended, nontender, no rebound pain, no organomegaly, BS present. GU: No hematuria Ext: No pitting leg edema bilaterally. 2+DP/PT pulse bilaterally. Musculoskeletal: No joint deformities, No joint redness or warmth, no limitation of ROM in spin. Skin: No rashes.  Neuro:  Alert, oriented X3, cranial nerves II-XII grossly intact, moves all extremitier. Has slurred speech. Psych: Patient is not psychotic, no suicidal or hemocidal ideation.  Labs on Admission: I have personally reviewed following labs and imaging studies  CBC: Recent Labs  Lab 06/14/20 1127  WBC 8.3  NEUTROABS 5.5  HGB 12.4  HCT 37.0  MCV 94.6  PLT 299   Basic Metabolic Panel: Recent Labs  Lab 06/14/20 1127  NA 138  K 4.0  CL 105  CO2 25  GLUCOSE 117*  BUN 27*  CREATININE 1.70*  CALCIUM 9.2   GFR: Estimated Creatinine Clearance: 26.8 mL/min (A) (by C-G formula based on SCr of 1.7 mg/dL (H)). Liver Function Tests: Recent Labs  Lab 06/14/20 1127  AST 18  ALT 10  ALKPHOS 23*  BILITOT 0.9  PROT 7.8  ALBUMIN 3.7   No results for input(s): LIPASE, AMYLASE in the last 168 hours. No results for input(s): AMMONIA in the last 168 hours. Coagulation Profile: Recent Labs  Lab 06/14/20 1127  INR 1.0   Cardiac Enzymes: No results for input(s): CKTOTAL,  CKMB, CKMBINDEX, TROPONINI in the last 168 hours. BNP (last 3 results) No results for input(s): PROBNP in the last 8760 hours. HbA1C: No results for input(s): HGBA1C in the last 72 hours. CBG: Recent Labs  Lab 06/14/20 1132  GLUCAP 101*   Lipid Profile: No results for input(s): CHOL, HDL, LDLCALC, TRIG, CHOLHDL, LDLDIRECT in the last 72 hours. Thyroid Function Tests: No results for input(s): TSH, T4TOTAL, FREET4, T3FREE, THYROIDAB in the last 72 hours. Anemia Panel: No results for input(s): VITAMINB12, FOLATE, FERRITIN, TIBC, IRON, RETICCTPCT in the last 72 hours. Urine analysis:    Component Value Date/Time   COLORURINE YELLOW 04/30/2020 0830   APPEARANCEUR CLEAR 04/30/2020 0830   APPEARANCEUR Cloudy (A) 04/11/2018 1601   LABSPEC 1.020 04/30/2020 0830   PHURINE 6.0 04/30/2020 0830   GLUCOSEU NEGATIVE 04/30/2020 0830   HGBUR NEGATIVE 04/30/2020 0830   BILIRUBINUR NEGATIVE 04/30/2020 0830   BILIRUBINUR  Negative 04/11/2018 1601   KETONESUR NEGATIVE 04/30/2020 0830   PROTEINUR NEGATIVE 04/30/2020 0830   NITRITE NEGATIVE 04/30/2020 0830   LEUKOCYTESUR NEGATIVE 04/30/2020 0830   Sepsis Labs: @LABRCNTIP (procalcitonin:4,lacticidven:4) ) Recent Results (from the past 240 hour(s))  SARS Coronavirus 2 by RT PCR (hospital order, performed in [place] hospital lab) Nasopharyngeal Nasopharyngeal Swab     Status: None   Collection Time: 06/14/20 11:33 AM   Specimen: Nasopharyngeal Swab  Result Value Ref Range Status   SARS Coronavirus 2 NEGATIVE NEGATIVE Final    Comment: (NOTE) SARS-CoV-2 target nucleic acids are NOT DETECTED.  The SARS-CoV-2 RNA is generally detectable in upper and lower respiratory specimens during the acute phase of infection. The lowest concentration of SARS-CoV-2 viral copies this assay can detect is 250 copies / mL. A negative result does not preclude SARS-CoV-2 infection and should not be used as the sole basis for treatment or other patient management decisions.  A negative result may occur with improper specimen collection / handling, submission of specimen other than nasopharyngeal swab, presence of viral mutation(s) within the areas targeted by this assay, and inadequate number of viral copies (<250 copies / mL). A negative result must be combined with clinical observations, patient history, and epidemiological information.  Fact Sheet for Patients:   StrictlyIdeas.no  Fact Sheet for Healthcare Providers: BankingDealers.co.za  This test is not yet approved or  cleared by the [place] FDA and has been authorized for detection and/or diagnosis of SARS-CoV-2 by FDA under an Emergency Use Authorization (EUA).  This EUA will remain in effect (meaning this test can be used) for the duration of the COVID-19 declaration under Section 564(b)(1) of the Act, 21 U.S.C. section 360bbb-3(b)(1), unless the authorization  is terminated or revoked sooner.  Performed at [facility], [address], [place], [place] [address]      Radiological Exams on Admission: CT Angio Head W or Wo Contrast  Result Date: 06/14/2020 CLINICAL DATA:  Stroke. Facial droop and aphasia. History of aneurysm treatment EXAM: CT ANGIOGRAPHY HEAD AND NECK TECHNIQUE: Multidetector CT imaging of the head and neck was performed using the standard protocol during bolus administration of intravenous contrast. Multiplanar CT image reconstructions and MIPs were obtained to evaluate the vascular anatomy. Carotid stenosis measurements (when applicable) are obtained utilizing NASCET criteria, using the distal internal carotid diameter as the denominator. CONTRAST:  34mL OMNIPAQUE IOHEXOL 350 MG/ML SOLN COMPARISON:  CT head 03/17/2012 FINDINGS: CT HEAD FINDINGS Brain: Progressive atrophy. Extensive chronic microvascular ischemic change throughout the white matter with progression. Aneurysm coiling left MCA bifurcation region  with streak artifact. Chronic encephalomalacia left temporal lobe with volume loss and enlargement of the left temporal horn similar to the prior study. Negative for acute infarct or hemorrhage. Suprasellar mass lesion measuring 5 x 6 mm. This shows homogeneous enhancement. The lesion is best seen on sagittal images. Vascular: Negative for hyperdense vessel Skull: Negative Sinuses: Visualized paranasal sinuses clear. Orbits: Bilateral cataract extraction Review of the MIP images confirms the above findings CTA NECK FINDINGS Aortic arch: Atherosclerotic calcification aortic arch. Left vertebral artery origin from the arch. Atherosclerotic calcification and mild stenosis proximal left subclavian artery. Mild stenosis at the origin of the left common carotid artery. Right carotid system: Mild atherosclerotic disease right carotid bifurcation without significant stenosis. Left carotid system: Atherosclerotic disease left carotid  bifurcation. Left internal carotid artery narrowed to 2.4 mm in minimal diameter corresponding to 47% stenosis. Vertebral arteries: Left vertebral artery dominant and patent without significant stenosis. Small right vertebral artery ends in PICA. Skeleton: Cervical degenerative changes with prominent facet degeneration bilaterally right greater than left. No acute skeletal abnormality. Other neck: Negative for mass or adenopathy in the neck. Upper chest: Apical emphysema and subpleural blebs. Apical scarring bilaterally. No acute abnormality. Atherosclerotic aortic arch. Review of the MIP images confirms the above findings CTA HEAD FINDINGS Anterior circulation: Mild atherosclerotic disease in the cavernous carotid bilaterally without stenosis. Anterior and middle cerebral arteries patent bilaterally without stenosis. Aneurysm coiling left MCA bifurcation. There is streak artifact from the coil however no aneurysm is identified filling with contrast. Posterior circulation: Left vertebral artery supplies the basilar. Left PICA patent. Right vertebral artery ends in PICA. Basilar patent. AICA, superior cerebellar arteries patent bilaterally. Fetal origin of the posterior cerebral artery bilaterally without stenosis or occlusion. No aneurysm in the posterior circulation. Venous sinuses: Normal venous enhancement Anatomic variants: None Review of the MIP images confirms the above findings IMPRESSION: 1. Negative for acute infarct or hemorrhage 2. Progression of atrophy and chronic ischemic change since 2013. 3. 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 No other aneurysm is identified 4. Negative for intracranial large vessel occlusion 5. Mild atherosclerotic disease the carotid bifurcation bilaterally. No significant right carotid stenosis. 47% diameter stenosis proximal left internal carotid artery. No significant vertebral artery stenosis. Right vertebral artery ends in PICA. 6. 5 x 6 mm  enhancing mass in the suprasellar cistern which may be associated with the infundibulum. Recommend MRI brain without and with contrast. Electronically Signed   By: Franchot Gallo M.D.   On: 06/14/2020 13:07   CT Angio Neck W and/or Wo Contrast  Result Date: 06/14/2020 CLINICAL DATA:  Stroke. Facial droop and aphasia. History of aneurysm treatment EXAM: CT ANGIOGRAPHY HEAD AND NECK TECHNIQUE: Multidetector CT imaging of the head and neck was performed using the standard protocol during bolus administration of intravenous contrast. Multiplanar CT image reconstructions and MIPs were obtained to evaluate the vascular anatomy. Carotid stenosis measurements (when applicable) are obtained utilizing NASCET criteria, using the distal internal carotid diameter as the denominator. CONTRAST:  62mL OMNIPAQUE IOHEXOL 350 MG/ML SOLN COMPARISON:  CT head 03/17/2012 FINDINGS: CT HEAD FINDINGS Brain: Progressive atrophy. Extensive chronic microvascular ischemic change throughout the white matter with progression. Aneurysm coiling left MCA bifurcation region with streak artifact. Chronic encephalomalacia left temporal lobe with volume loss and enlargement of the left temporal horn similar to the prior study. Negative for acute infarct or hemorrhage. Suprasellar mass lesion measuring 5 x 6 mm. This shows homogeneous enhancement. The lesion is best seen on sagittal  images. Vascular: Negative for hyperdense vessel Skull: Negative Sinuses: Visualized paranasal sinuses clear. Orbits: Bilateral cataract extraction Review of the MIP images confirms the above findings CTA NECK FINDINGS Aortic arch: Atherosclerotic calcification aortic arch. Left vertebral artery origin from the arch. Atherosclerotic calcification and mild stenosis proximal left subclavian artery. Mild stenosis at the origin of the left common carotid artery. Right carotid system: Mild atherosclerotic disease right carotid bifurcation without significant stenosis. Left  carotid system: Atherosclerotic disease left carotid bifurcation. Left internal carotid artery narrowed to 2.4 mm in minimal diameter corresponding to 47% stenosis. Vertebral arteries: Left vertebral artery dominant and patent without significant stenosis. Small right vertebral artery ends in PICA. Skeleton: Cervical degenerative changes with prominent facet degeneration bilaterally right greater than left. No acute skeletal abnormality. Other neck: Negative for mass or adenopathy in the neck. Upper chest: Apical emphysema and subpleural blebs. Apical scarring bilaterally. No acute abnormality. Atherosclerotic aortic arch. Review of the MIP images confirms the above findings CTA HEAD FINDINGS Anterior circulation: Mild atherosclerotic disease in the cavernous carotid bilaterally without stenosis. Anterior and middle cerebral arteries patent bilaterally without stenosis. Aneurysm coiling left MCA bifurcation. There is streak artifact from the coil however no aneurysm is identified filling with contrast. Posterior circulation: Left vertebral artery supplies the basilar. Left PICA patent. Right vertebral artery ends in PICA. Basilar patent. AICA, superior cerebellar arteries patent bilaterally. Fetal origin of the posterior cerebral artery bilaterally without stenosis or occlusion. No aneurysm in the posterior circulation. Venous sinuses: Normal venous enhancement Anatomic variants: None Review of the MIP images confirms the above findings IMPRESSION: 1. Negative for acute infarct or hemorrhage 2. Progression of atrophy and chronic ischemic change since 2013. 3. 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 No other aneurysm is identified 4. Negative for intracranial large vessel occlusion 5. Mild atherosclerotic disease the carotid bifurcation bilaterally. No significant right carotid stenosis. 47% diameter stenosis proximal left internal carotid artery. No significant vertebral artery stenosis.  Right vertebral artery ends in PICA. 6. 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 Recommend MRI brain without and with contrast. Electronically Signed   By: Franchot Gallo M.D.   On: 06/14/2020 13:07   MR Brain W and Wo Contrast  Result Date: 06/14/2020 CLINICAL DATA:  Speech difficulty.  History of aneurysm repair EXAM: MRI HEAD WITHOUT AND WITH CONTRAST TECHNIQUE: Multiplanar, multiecho pulse sequences of the brain and surrounding structures were obtained without and with intravenous contrast. CONTRAST:  42mL GADAVIST GADOBUTROL 1 MMOL/ML IV SOLN COMPARISON:  CT angio head and neck today FINDINGS: Brain: Acute infarct left mid frontal gyrus. No associated hemorrhage. No other acute infarct. Chronic hemorrhagic infarct left temporal lobe. Prior coiling of left MCA aneurysm. Moderate atrophy. Extensive chronic microvascular ischemic change in the white matter and pons. Small chronic infarcts in the right cerebellum. Chronic infarcts in the basal ganglia bilaterally and thalamus bilaterally. Small enhancing suprasellar mass measuring approximately 5 x 6 mm as noted on CT. This appears separate from the pituitary and is in the midline near the infundibulum. Anatomic detail is limited due to motion and lack of pituitary protocol. The pituitary is normal in size Vascular: Normal arterial flow voids. Coiling of left MCA bifurcation aneurysm. Skull and upper cervical spine: No focal skeletal lesion. Sinuses/Orbits: Mild mucosal edema paranasal sinuses. Bilateral cataract extraction Other: None IMPRESSION: Acute infarct in the left middle frontal gyrus. Atrophy and extensive chronic ischemic change. Chronic hemorrhagic infarct left  temporal lobe. 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 Given the enhancing nature, Rathke's cleft cyst not considered likely. Possibilities include sarcoidosis, lymphoma, metastatic disease, craniopharyngioma, and other  inflammatory processes of the infundibulum. Electronically Signed   By: Franchot Gallo M.D.   On: 06/14/2020 16:17     EKG: Independently reviewed.  Sinus rhythm, QTC 476, LAE, nonspecific T wave change  Assessment/Plan Principal Problem:   Stroke [facility]) Active Problems:   Depression   Hyperlipidemia   Essential hypertension   CKD (chronic kidney disease), stage IIIa   Chronic systolic CHF (congestive heart failure) (HCC)   Tobacco abuse   Aphasia   Brain mass   Stroke [facility]): Her worsening aphasia is likely due to recurrent stroke.  MRI showed acute infarct in the left middle frontal gyrus. Pt is not compliant to Plavix.  Neurology, Dr. Irish Elders is consulted.  - Placed in progressive unit for obs - will follow up Neurology's Recs.  -resume plavix 75 mg daily - fasting lipid panel and HbA1c  - 2D transthoracic echocardiography  - swallowing screen. If fails, will get SLP - PT/OT consult  Depression: -Celexa  Hyperlipidemia -Lipitor  Essential hypertension -Hold Cozaar due to worsening renal function -As needed hydralazine  CKD (chronic kidney disease), stage IIIa: Baseline creatinine 1.5 on 01/24/2028.  Her creatinine is 1.70, BUN 27, slightly worsening. -Hold Cozaar -Follow-up by BMP  Chronic systolic CHF (congestive heart failure) ([place]): 2D echo on 11/29/2012 showed EF of 30%.  Patient does not have leg edema.  No JVD.  No shortness of breath.  CHF is compensated. -Check BNP  Tobacco abuse -nicotine patch  Brain mass:  MRI showed 5 x 6 mm enhancing suprasellar mass as noted on CT. Given the enhancing nature, Rathke's cleft cyst not considered likely. Possibilities include sarcoidosis, lymphoma, metastatic disease, craniopharyngioma, and other inflammatory processes of the infundibulum. Per Dr. Irish Elders, this can be followed up as outpatient. I also spoke with Dr. Zada Finders of neurosurgery on the phone, he does not think this small brain mass is causing her symptoms.   Patient can be followed up with Dr. Venetia Constable as outpatient. -Follow-up with neurosurgery as outpatient       DVT ppx: SQ Lovenox Code Status: Full code (I tried to have discussed with patient about CODE STATUS, but she asked me to discuss with her husband.  Per her husband, patient is full code). Family Communication:  Yes, patient's  husband at bed side Disposition Plan:  Anticipate discharge back to previous environment Consults called: Dr. Irish Elders for neurology and Dr. Venetia Constable of neurosurgeon  admission status: progressive unit for obs       Status is: Observation  The patient remains OBS appropriate and will d/c before 2 midnights.  Dispo: The patient is from: Home              Anticipated d/c is to: Home              Anticipated d/c date is: 1 day              Patient currently is not medically stable to d/c.           Date of Service 06/14/2020    Ivor Costa [facility]   If 7PM-7AM, please contact night-coverage [email or web address] 06/14/2020, 5:20 PM

## 2020-06-14 NOTE — ED Notes (Signed)
Pt dentures in pink container and multiple stickers placed on container. Dentures remain at bedside.

## 2020-06-14 NOTE — ED Notes (Signed)
Assisted pt to restroom  

## 2020-06-14 NOTE — ED Notes (Addendum)
Sent message to MD regarding BP. Will order hydralazine.

## 2020-06-14 NOTE — ED Notes (Signed)
Dietitian to put in for transport

## 2020-06-14 NOTE — ED Provider Notes (Signed)
[name] Emergency Department Provider Note  ____________________________________________   First MD Initiated Contact with Patient 06/14/20 1111     (approximate)  I have reviewed the triage vital signs and the nursing notes.   HISTORY  Chief Complaint Aphasia    HPI [name] is a 74 y.o. female with dementia, CKD, prior stroke, prior brain aneurysm status post repair who comes in for aphasia.  According to the husband [name] was Weds night June 30th 2021.  Noticed symptoms Thursday morning July 1st. Slightly improving since then.  He denies noticing any facial asymmetry or any weakness.  The symptoms have been constant, slightly improving, unclear what brought it on, nothing makes it worse.  No chest pain, abdominal pain, shortness of breath.  He does report history of a brain aneurysm repair that she had difficulty speaking afterwards and had to go to speech therapy.  On review of records I cannot see the this in our system.  She denies any symptoms of headaches.          Past Medical History:  Diagnosis Date  . Anxiety   . Benign neoplasm of colon 03/27/2014  . Brain aneurysm 2006  . Cerebrovascular disease 06/28/2014  . Chronic kidney disease   . Dementia, vascular ([name]) 06/19/2014  . Depression   . Hyperlipidemia 02/20/2019  . Pure hypercholesterolemia 03/27/2014  . Receptive aphasia   . Sleep apnea   . Stroke ([name])   . Vascular dementia ([name])   . Wears dentures     Patient Active Problem List   Diagnosis Date Noted  . Carotid stenosis 02/26/2019  . Essential hypertension 02/26/2019  . PAD (peripheral artery disease) ([name]) 02/26/2019  . Depression 02/20/2019  . Hyperlipidemia 02/20/2019  . Receptive aphasia 02/20/2019  . Cerebrovascular disease 06/28/2014  . Dementia, vascular ([name]) 06/19/2014  . Benign neoplasm of colon 03/27/2014  . Pure hypercholesterolemia 03/27/2014    Past Surgical History:  Procedure Laterality Date  .  CATARACT EXTRACTION W/PHACO Left 04/27/2018   Procedure: CATARACT EXTRACTION PHACO AND INTRAOCULAR LENS PLACEMENT ([name]) LEFT;  Surgeon: Leandrew Koyanagi, MD;  Location: [name];  Service: Ophthalmology;  Laterality: Left;  . CATARACT EXTRACTION W/PHACO Right 06/14/2018   Procedure: CATARACT EXTRACTION PHACO AND INTRAOCULAR LENS PLACEMENT ([name])  RIGHT;  Surgeon: Leandrew Koyanagi, MD;  Location: [place];  Service: Ophthalmology;  Laterality: Right;  . CEREBRAL ANEURYSM REPAIR      Prior to Admission medications   Medication Sig Start Date End Date Taking? Authorizing Provider  citalopram (CELEXA) 20 MG tablet Take 20 mg by mouth daily.    [provider]  clopidogrel (PLAVIX) 75 MG tablet Take by mouth. 03/21/18   [provider]  losartan (COZAAR) 50 MG tablet  02/17/18   [provider]  mirabegron ER (MYRBETRIQ) 50 MG TB24 tablet Take 1 tablet (50 mg total) by mouth daily. 04/30/20   Billey Co, MD  multivitamin-lutein [facility]) CAPS capsule Take 1 capsule by mouth daily.    [provider]  simvastatin (ZOCOR) 80 MG tablet  03/15/18   [provider]  vitamin B-12 (CYANOCOBALAMIN) 1000 MCG tablet Take 1,000 mcg by mouth daily.    [provider]  Vitamin D, Ergocalciferol, (DRISDOL) 50000 units CAPS capsule  03/15/18   [provider]    Allergies Patient has no known allergies.  Family History  Problem Relation Age of Onset  . Dementia Mother   . Heart attack Brother  Social History Social History   Tobacco Use  . Smoking status: Current Every Day Smoker    Packs/day: 1.00    Types: Cigarettes  . Smokeless tobacco: Never Used  Vaping Use  . Vaping Use: Never used  Substance Use Topics  . Alcohol use: Not Currently  . Drug use: Never      Review of Systems Constitutional: No fever/chills Eyes: No visual changes. ENT: No sore throat. Cardiovascular: Denies chest pain.  Respiratory: Denies shortness of breath. Gastrointestinal: No abdominal pain.  No nausea, no vomiting.  No diarrhea.  No constipation. Genitourinary: Negative for dysuria. Musculoskeletal: Negative for back pain. Skin: Negative for rash. Neurological: Negative for headaches, focal weakness or numbness.  Positive aphasia All other ROS negative ____________________________________________   PHYSICAL EXAM:  VITAL SIGNS: Blood pressure (!) 166/84, pulse 69, temperature 98.3 F (36.8 C), temperature source Oral, resp. rate 18, height 5\' 6"  (1.676 m), weight 63.5 kg, SpO2 94 %.   Constitutional: Alert and oriented. Well appearing and in no acute distress. Eyes: Conjunctivae are normal. EOMI. Head: Atraumatic. Nose: No congestion/rhinnorhea. Mouth/Throat: Mucous membranes are moist.   Neck: No stridor. Trachea Midline. FROM Cardiovascular: Normal rate, regular rhythm. Grossly normal heart sounds.  Good peripheral circulation. Respiratory: Normal respiratory effort.  No retractions. Lungs CTAB. Gastrointestinal: Soft and nontender. No distention. No abdominal bruits.  Musculoskeletal: No lower extremity tenderness nor edema.  No joint effusions. Neurologic:  NIHSS 2, mild aphasia and dysarthria.  Skin:  Skin is warm, dry and intact. No rash noted. Psychiatric: Mood and affect are normal. Speech and behavior are normal. GU: Deferred   ____________________________________________   LABS (all labs ordered are listed, but only abnormal results are displayed)  Labs Reviewed  COMPREHENSIVE METABOLIC PANEL - Abnormal; Notable for the following components:      Result Value   Glucose, Bld 117 (*)    BUN 27 (*)    Creatinine, Ser 1.70 (*)    Alkaline Phosphatase 23 (*)    GFR calc non Af Amer 29 (*)    GFR calc Af Amer 34 (*)    All other components within normal limits  GLUCOSE, CAPILLARY - Abnormal; Notable for the following components:   Glucose-Capillary 101 (*)    All other  components within normal limits  SARS CORONAVIRUS 2 BY RT PCR (HOSPITAL ORDER, [place] [facility])  PROTIME-INR  APTT  CBC  DIFFERENTIAL  CBG MONITORING, ED   ____________________________________________   ED ECG REPORT I, [name], the attending physician, personally viewed and interpreted this ECG.  Normal sinus rate of 70, no ST elevation, no T wave inversion, normal intervals ____________________________________________  RADIOLOGY   Official radiology report(s): CT Angio Head W or Wo Contrast  Result Date: 06/14/2020 CLINICAL DATA:  Stroke. Facial droop and aphasia. History of aneurysm treatment EXAM: CT ANGIOGRAPHY HEAD AND NECK TECHNIQUE: Multidetector CT imaging of the head and neck was performed using the standard protocol during bolus administration of intravenous contrast. Multiplanar CT image reconstructions and MIPs were obtained to evaluate the vascular anatomy. Carotid stenosis measurements (when applicable) are obtained utilizing NASCET criteria, using the distal internal carotid diameter as the denominator. CONTRAST:  60mL OMNIPAQUE IOHEXOL 350 MG/ML SOLN COMPARISON:  CT head 03/17/2012 FINDINGS: CT HEAD FINDINGS Brain: Progressive atrophy. Extensive chronic microvascular ischemic change throughout the white matter with progression. Aneurysm coiling left MCA bifurcation region with streak artifact. Chronic encephalomalacia left temporal lobe with volume loss and enlargement of the left temporal  horn similar to the prior study. Negative for acute infarct or hemorrhage. Suprasellar mass lesion measuring 5 x 6 mm. This shows homogeneous enhancement. The lesion is best seen on sagittal images. Vascular: Negative for hyperdense vessel Skull: Negative Sinuses: Visualized paranasal sinuses clear. Orbits: Bilateral cataract extraction Review of the MIP images confirms the above findings CTA NECK FINDINGS Aortic arch: Atherosclerotic calcification aortic arch. Left  vertebral artery origin from the arch. Atherosclerotic calcification and mild stenosis proximal left subclavian artery. Mild stenosis at the origin of the left common carotid artery. Right carotid system: Mild atherosclerotic disease right carotid bifurcation without significant stenosis. Left carotid system: Atherosclerotic disease left carotid bifurcation. Left internal carotid artery narrowed to 2.4 mm in minimal diameter corresponding to 47% stenosis. Vertebral arteries: Left vertebral artery dominant and patent without significant stenosis. Small right vertebral artery ends in PICA. Skeleton: Cervical degenerative changes with prominent facet degeneration bilaterally right greater than left. No acute skeletal abnormality. Other neck: Negative for mass or adenopathy in the neck. Upper chest: Apical emphysema and subpleural blebs. Apical scarring bilaterally. No acute abnormality. Atherosclerotic aortic arch. Review of the MIP images confirms the above findings CTA HEAD FINDINGS Anterior circulation: Mild atherosclerotic disease in the cavernous carotid bilaterally without stenosis. Anterior and middle cerebral arteries patent bilaterally without stenosis. Aneurysm coiling left MCA bifurcation. There is streak artifact from the coil however no aneurysm is identified filling with contrast. Posterior circulation: Left vertebral artery supplies the basilar. Left PICA patent. Right vertebral artery ends in PICA. Basilar patent. AICA, superior cerebellar arteries patent bilaterally. Fetal origin of the posterior cerebral artery bilaterally without stenosis or occlusion. No aneurysm in the posterior circulation. Venous sinuses: Normal venous enhancement Anatomic variants: None Review of the MIP images confirms the above findings IMPRESSION: 1. Negative for acute infarct or hemorrhage 2. Progression of atrophy and chronic ischemic change since 2013. 3. 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 No  other aneurysm is identified 4. Negative for intracranial large vessel occlusion 5. Mild atherosclerotic disease the carotid bifurcation bilaterally. No significant right carotid stenosis. 47% diameter stenosis proximal left internal carotid artery. No significant vertebral artery stenosis. Right vertebral artery ends in PICA. 6. 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 Recommend MRI brain without and with contrast. Electronically Signed   By: Franchot Gallo M.D.   On: 06/14/2020 13:07   CT Angio Neck W and/or Wo Contrast  Result Date: 06/14/2020 CLINICAL DATA:  Stroke. Facial droop and aphasia. History of aneurysm treatment EXAM: CT ANGIOGRAPHY HEAD AND NECK TECHNIQUE: Multidetector CT imaging of the head and neck was performed using the standard protocol during bolus administration of intravenous contrast. Multiplanar CT image reconstructions and MIPs were obtained to evaluate the vascular anatomy. Carotid stenosis measurements (when applicable) are obtained utilizing NASCET criteria, using the distal internal carotid diameter as the denominator. CONTRAST:  62mL OMNIPAQUE IOHEXOL 350 MG/ML SOLN COMPARISON:  CT head 03/17/2012 FINDINGS: CT HEAD FINDINGS Brain: Progressive atrophy. Extensive chronic microvascular ischemic change throughout the white matter with progression. Aneurysm coiling left MCA bifurcation region with streak artifact. Chronic encephalomalacia left temporal lobe with volume loss and enlargement of the left temporal horn similar to the prior study. Negative for acute infarct or hemorrhage. Suprasellar mass lesion measuring 5 x 6 mm. This shows homogeneous enhancement. The lesion is best seen on sagittal images. Vascular: Negative for hyperdense vessel Skull: Negative Sinuses: Visualized paranasal sinuses clear. Orbits: Bilateral cataract extraction  Review of the MIP images confirms the above findings CTA NECK FINDINGS Aortic arch:  Atherosclerotic calcification aortic arch. Left vertebral artery origin from the arch. Atherosclerotic calcification and mild stenosis proximal left subclavian artery. Mild stenosis at the origin of the left common carotid artery. Right carotid system: Mild atherosclerotic disease right carotid bifurcation without significant stenosis. Left carotid system: Atherosclerotic disease left carotid bifurcation. Left internal carotid artery narrowed to 2.4 mm in minimal diameter corresponding to 47% stenosis. Vertebral arteries: Left vertebral artery dominant and patent without significant stenosis. Small right vertebral artery ends in PICA. Skeleton: Cervical degenerative changes with prominent facet degeneration bilaterally right greater than left. No acute skeletal abnormality. Other neck: Negative for mass or adenopathy in the neck. Upper chest: Apical emphysema and subpleural blebs. Apical scarring bilaterally. No acute abnormality. Atherosclerotic aortic arch. Review of the MIP images confirms the above findings CTA HEAD FINDINGS Anterior circulation: Mild atherosclerotic disease in the cavernous carotid bilaterally without stenosis. Anterior and middle cerebral arteries patent bilaterally without stenosis. Aneurysm coiling left MCA bifurcation. There is streak artifact from the coil however no aneurysm is identified filling with contrast. Posterior circulation: Left vertebral artery supplies the basilar. Left PICA patent. Right vertebral artery ends in PICA. Basilar patent. AICA, superior cerebellar arteries patent bilaterally. Fetal origin of the posterior cerebral artery bilaterally without stenosis or occlusion. No aneurysm in the posterior circulation. Venous sinuses: Normal venous enhancement Anatomic variants: None Review of the MIP images confirms the above findings IMPRESSION: 1. Negative for acute infarct or hemorrhage 2. Progression of atrophy and chronic ischemic change since 2013. 3. Left MCA bifurcation  aneurysm coiling without recurrent aneurysm. No other aneurysm is identified 4. Negative for intracranial large vessel occlusion 5. Mild atherosclerotic disease the carotid bifurcation bilaterally. No significant right carotid stenosis. 47% diameter stenosis proximal left internal carotid artery. No significant vertebral artery stenosis. Right vertebral artery ends in PICA. 6. 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 Recommend MRI brain without and with contrast. Electronically Signed   By: Franchot Gallo M.D.   On: 06/14/2020 13:07    ____________________________________________   PROCEDURES  Procedure(s) performed (including Critical Care):  Procedures   ____________________________________________   INITIAL IMPRESSION / ASSESSMENT AND PLAN / ED COURSE  [name] was evaluated in Emergency Department on 06/14/2020 for the symptoms described in the history of present illness. She was evaluated in the context of the global COVID-19 pandemic, which necessitated consideration that the patient might be at risk for infection with the SARS-CoV-2 virus that causes COVID-19. Institutional protocols and algorithms that pertain to the evaluation of patients at risk for COVID-19 are in a state of rapid change based on information released by regulatory bodies including the CDC and federal and state organizations. These policies and algorithms were followed during the patient's care in the ED.     Patient is a 74 year old who is greater than 24 hours out with some aphasia and dysarthria.  No other focal weaknesses or facial drift noted at this time.  Patient does have a history of aneurysm.  Denies any headaches.  Suspect this is less likely from an aneurysmal rupture but will get CTA to make sure no signs of carotid artery stenosis, intracranial aneurysm, head bleed, stroke, tumor.  Will get labs to evaluate Electra abnormalities, AKI.  Patient  will need admission for stroke work-up.  Kidney function slightly elevated at 1.7. got permission from Dr. Carlis Abbott to  do CT scan with contrast.   CT negative for aneurysm or significant stenosis.  There is concern for little mass in infundibulum recommend MRI with and without contrast.  Patient does have a coil in place.  Family is unsure if she is able to get an MRI or not.  I discussed with the MRI tech who is going to look into her notes from care everywhere and they cannot find that is MRI compatible we will discuss with the radiologist to make sure that it safe to get the MRI.  Discussed the hospital team for admission for stroke work-up    ____________________________________________   FINAL CLINICAL IMPRESSION(S) / ED DIAGNOSES   Final diagnoses:  Aphasia  AKI (acute kidney injury) ([place])      MEDICATIONS GIVEN DURING THIS VISIT:  Medications  sodium chloride flush (NS) 0.9 % injection 3 mL (has no administration in time range)  sodium chloride 0.9 % bolus 500 mL (has no administration in time range)  iohexol (OMNIPAQUE) 350 MG/ML injection 60 mL (60 mLs Intravenous Contrast Given 06/14/20 1235)     ED Discharge Orders    None       Note:  This document was prepared using Dragon voice recognition software and may include unintentional dictation errors.   Linda , MD 06/14/20 1346

## 2020-06-15 DIAGNOSIS — I5022 Chronic systolic (congestive) heart failure: Secondary | ICD-10-CM | POA: Diagnosis not present

## 2020-06-15 DIAGNOSIS — I639 Cerebral infarction, unspecified: Secondary | ICD-10-CM

## 2020-06-15 LAB — LIPID PANEL
Cholesterol: 136 mg/dL (ref 0–200)
HDL: 40 mg/dL — ABNORMAL LOW (ref >40)
LDL Cholesterol: 76 mg/dL (ref 0–99)
Total CHOL/HDL Ratio: 3.4 RATIO
Triglycerides: 99 mg/dL (ref <150)
VLDL: 20 mg/dL (ref 0–40)

## 2020-06-15 LAB — HEMOGLOBIN A1C
Hgb A1c MFr Bld: 5.5 % (ref 4.8–5.6)
Mean Plasma Glucose: 111.15 mg/dL

## 2020-06-15 MED ORDER — CLOPIDOGREL BISULFATE 75 MG PO TABS: 75.0000 mg | ORAL_TABLET | Freq: Every day | ORAL | 2 refills | Status: DC

## 2020-06-15 MED ORDER — HYDRALAZINE HCL 20 MG/ML IJ SOLN
5.0000 mg | INTRAMUSCULAR | Status: DC | PRN
  Administered 2020-06-15: 5 mg via INTRAVENOUS
  Filled 2020-06-15: qty 1

## 2020-06-15 MED ORDER — ATORVASTATIN CALCIUM 40 MG PO TABS: 40.0000 mg | ORAL_TABLET | Freq: Every day | ORAL | 2 refills | Status: AC

## 2020-06-15 MED ORDER — MELATONIN 5 MG PO TABS
5.0000 mg | ORAL_TABLET | Freq: Every day | ORAL | Status: DC
  Filled 2020-06-15: qty 1

## 2020-06-15 NOTE — Evaluation (Signed)
Speech Language Pathology Evaluation Patient Details Name: MANDA HOLSTAD MRN: [id number] DOB: [birth year] Today's Date: 06/15/2020 Time: 0920-1010 SLP Time Calculation (min) (ACUTE ONLY): 50 min  Problem List:  Patient Active Problem List   Diagnosis Date Noted  . Aphasia 06/14/2020  . Brain mass 06/14/2020  . Stroke ([place])   . CKD (chronic kidney disease), stage IIIa   . Chronic systolic CHF (congestive heart failure) ([employer])   . Tobacco abuse   . Carotid stenosis 02/26/2019  . Essential hypertension 02/26/2019  . PAD (peripheral artery disease) ([place]) 02/26/2019  . Depression 02/20/2019  . Hyperlipidemia 02/20/2019  . Receptive aphasia 02/20/2019  . Cerebrovascular disease 06/28/2014  . Dementia, vascular ([place]) 06/19/2014  . Benign neoplasm of colon 03/27/2014  . Pure hypercholesterolemia 03/27/2014   Past Medical History:  Past Medical History:  Diagnosis Date  . Anxiety   . Benign neoplasm of colon 03/27/2014  . Brain aneurysm 2006  . Cerebrovascular disease 06/28/2014  . Chronic kidney disease   . Dementia, vascular ([place]) 06/19/2014  . Depression   . Hyperlipidemia 02/20/2019  . Pure hypercholesterolemia 03/27/2014  . Receptive aphasia   . Sleep apnea   . Stroke ([employer])   . Vascular dementia ([place])   . Wears dentures    Past Surgical History:  Past Surgical History:  Procedure Laterality Date  . CATARACT EXTRACTION W/PHACO Left 04/27/2018   Procedure: CATARACT EXTRACTION PHACO AND INTRAOCULAR LENS PLACEMENT ([place]) LEFT;  Surgeon: Leandrew Koyanagi, MD;  Location: [facility];  Service: Ophthalmology;  Laterality: Left;  . CATARACT EXTRACTION W/PHACO Right 06/14/2018   Procedure: CATARACT EXTRACTION PHACO AND INTRAOCULAR LENS PLACEMENT ([place])  RIGHT;  Surgeon: Leandrew Koyanagi, MD;  Location: [facility];  Service: Ophthalmology;  Laterality: Right;  . CEREBRAL ANEURYSM REPAIR     HPI:  Pt is a 74 y.o. female with medical history significant of  hypertension, hyperlipidemia, stroke, depression, anxiety, vascular Dementia, OSA, CKD-3, s/p of brain aneurysm repair, tobacco abuse, sCHF with EF 30%, who presents with Worsening aphasia. Husband states that patient has aphasia from previous stroke, but her aphasia is much worse since yesterday.  He could not understand what patient is saying to hime which is new.  Patient does not have unilateral weakness, numbness or tingling extremities.  No obvious facial droop.  No vision loss or hearing loss.  Patient does not have chest pain, shortness breath, cough, fever or chills.  No nausea vomiting, diarrhea, abdominal pain, symptoms of UTI.  Husband states that patient ran out of Plavix for a while.  MRI revealed: "Atrophy and extensive chronic ischemic change. 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  Pt awake, verbal and engaged but slightly anxious about having to be in the hospital. Pt has Baseline Aphasia -- appears more expressive in nature per Husband's description. Also noted Dysarthria. Pt does wear Dentures which were not in place at time of eval.    Assessment / Plan / Recommendation Clinical Impression  Pt appears to present w/ Moderate-Severe Expressive Aphasia w/ some Receptive language deficits/Aphasia as well as Dysarthria of speech. MRI revealed: "Atrophy and extensive chronic ischemic change. 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  Pt has Baseline Aphasia -- appears more Expressive in nature per Husband's description; Vasuclar Dementia dx'd Baseline as well. Also  noted was Dysarthria. Pt does wear Dentures which were not in place at time of eval. Pt was awake/alert, and easily engaged in conversation and interaction w/ others. During Language tasks, pt exhibited word finding and  paraphasias during speech tasks; pt was inconsistently aware of errors and made attempts to correct. Phonemic and semantic cues were not successful in improving accuracy of responses. Spontaneous speech tasks were accurate, but min hesitation to initiate speech tasks as well as Dysarthria noted which significantly decreased intelligibility of speech. Most verbal responses and communication were at the word-phrase level. During Receptive tasks, pt exhibited increased errors as the task increased in complexity(2-3 step commands, word repetition at sentence level, complex y/n questions). Though pt exhibited object naming impairments(from an Expressive standpoint), she exhibited accurate object function w/ tasks. Pt is able to ID/read few single words at the word level w/ reduced intelligibility. Cognitive abilities/status declined at Baseline; dx'd Vascular Dementia. OM lingual/labial movements were grossly wfl. Pt's functional communication for ADLs is impacted by her Aphasia. Unsure if there are New changes in her communication status(from the previous, chronic Aphasia). Pt will benefit from Home Health/Outpatient Speech therapy to address expressive and receptive language abilities, cognitive deficits, and dysarthria and learn compensatory strategies in order to improve communication in ADLs. Handouts given. MD and NSG updated on above. Pt and Husband agreed.    SLP Assessment  SLP Recommendation/Assessment: All further Speech Lanaguage Pathology  needs can be addressed in the next venue of care SLP Visit Diagnosis: Aphasia (R47.01);Dysarthria and anarthria (R47.1);Cognitive communication deficit (R41.841)    Follow Up Recommendations  Home health SLP    Frequency and Duration  TBD        SLP Evaluation Cognition  Overall Cognitive Status: History of cognitive impairments - at baseline Arousal/Alertness: Awake/alert Orientation Level: Oriented to person;Oriented to place;Oriented to  situation Attention: Focused;Sustained Focused Attention: Appears intact Sustained Attention: Appears intact (min anxious baseline) Memory: Impaired Memory Impairment: Decreased short term memory Decreased Short Term Memory: Verbal complex;Functional complex Awareness: Appears intact Problem Solving: Appears intact (for basic tasks) Executive Function: Reasoning;Organizing Reasoning: Appears intact (w/ basic tasks) Organizing: Appears intact (w/ basic tasks) Behaviors: Restless (min anxious about having to be in the hospital) Safety/Judgment: Appears intact       Comprehension  Auditory Comprehension Overall Auditory Comprehension: Impaired at baseline (unsure if new changes) Yes/No Questions: Impaired (more complex) Commands: Impaired (multi-step of 2-3) Conversation: Simple (WFL) Other Conversation Comments: expressive deficits; dysarthria Interfering Components: Hearing;Processing speed;Attention;Anxiety;Visual impairments (min HOH; wears glasses) EffectiveTechniques: Extra processing time;Slowed speech;Visual/Gestural cues Visual Recognition/Discrimination Discrimination: Not tested Reading Comprehension Reading Status: Not tested    Expression Expression Primary Mode of Expression: Verbal Verbal Expression Overall Verbal Expression: Impaired at baseline Automatic Speech: Name;Social Response;Counting;Day of week (grossly wfl) Level of Generative/Spontaneous Verbalization: Phrase (3-4 words) Repetition: Impaired Level of Impairment: Phrase level Naming: Impairment Responsive: 26-50% accurate Confrontation: Impaired Convergent: 0-24% accurate Divergent: 0-24% accurate Pragmatics: No impairment Interfering Components: Attention;Speech intelligibility;Premorbid deficit Effective Techniques: Open ended questions Non-Verbal Means of Communication: Gestures Written Expression Dominant Hand: Right Written Expression: Not tested   Oral / Motor  Oral Motor/Sensory  Function Overall Oral Motor/Sensory Function: Within functional limits (grossly) Motor Speech Overall Motor Speech: Impaired at baseline Respiration: Within functional limits Phonation: Normal Resonance: Within functional limits Articulation: Impaired Level of Impairment: Word Intelligibility: Intelligibility reduced Word: 0-24% accurate Phrase: 0-24% accurate   GO  Orinda Kenner, MS, CCC-SLP Ronnell Makarewicz 06/15/2020, 1:21 PM

## 2020-06-15 NOTE — TOC Transition Note (Signed)
Transition of Care [facility]) - CM/SW Discharge Note   Patient Details  Name: [name] MRN: [id number] Date of Birth: 11-May-1946  [facility] CM/SW Contact:  Boris Sharper, LCSW Phone Number: 06/15/2020, 3:22 PM   Clinical Narrative:    Pt medically stable for discharge per MD. Pt will be transported home by her family. CSW discussed HH option with family and they were agreeable to PT, OT and Speech HH. CSW contacted [facility] with referral, [facility] able to accept referral.   Final next level of care: Home w Home Health Services Barriers to Discharge: No Barriers Identified   Patient Goals and CMS Choice Patient states their goals for this hospitalization and ongoing recovery are:: to gain strength back CMS Medicare.gov Compare Post Acute Care list provided to:: Patient Choice offered to / list presented to : Patient  Discharge Placement                Patient to be transferred to facility by: Daughter Name of family member notified: [name] Patient and family notified of of transfer: 06/15/20  Discharge Plan and Services                          HH Arranged: PT, OT, Speech Therapy [place] Agency: [place] ([place]) Date [place]: 06/15/20 Time [place]: [id number] Representative spoke with at [place]: [place] ([place]) Interventions     Readmission Risk Interventions No flowsheet data found.

## 2020-06-15 NOTE — Evaluation (Signed)
Physical Therapy Evaluation Patient Details Name: Linda [name] MRN: [id number] DOB: 02/18/1946 Today's Date: 06/15/2020   History of Present Illness  74 y.o. female who was admitted with worsening slurred speech. Imaging revealed and Acute Infarct in the Left middle Frontal Gyrus. 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 PMHx includes: HTN, CVA, Depression, Anxiety, Vascular Dementia, OSA, CKD-3, Brain Aneurysm repair, Tobacco Abuse, CHF with EF 30%,  Aphasia.  Clinical Impression  Pt was assessed with nursing approval as her BP had begun to correct from earlier 173/100 down to 162/60.  Her Gait and balance were unsteady, with moderate incoordination noted on LLE.  Will continue to work on standing control of balance, on LE strength and awareness of safety with confined spaces.  Pt is motivated and family is supportive.  Follow for these needs.      Follow Up Recommendations Home health PT;Supervision/Assistance - 24 hour;Supervision for mobility/OOB    Equipment Recommendations  Rolling walker with 5" wheels    Recommendations for Other Services       Precautions / Restrictions Precautions Precautions: Fall Precaution Comments: monitor sats, pulses Restrictions Weight Bearing Restrictions: No      Mobility  Bed Mobility Overal bed mobility: Needs Assistance Bed Mobility: Supine to Sit     Supine to sit: Min guard     General bed mobility comments: min guard to assist sitting up bedside  Transfers Overall transfer level: Needs assistance Equipment used: Rolling walker (2 wheeled);1 person hand held assist Transfers: Sit to/from Stand Sit to Stand: Min guard         General transfer comment: min guard to power up and control standing  Ambulation/Gait Ambulation/Gait assistance: Min assist;Min guard Gait Distance (Feet): 300 Feet Assistive device: Rolling walker (2 wheeled);1 person hand held assist Gait  Pattern/deviations: Step-through pattern;Decreased stride length;Decreased stance time - left;Wide base of support Gait velocity: reduced Gait velocity interpretation: <1.31 ft/sec, indicative of household ambulator General Gait Details: pt had one episode of listing to R in which PT had to help recover balance  Stairs            Wheelchair Mobility    Modified Rankin (Stroke Patients Only)       Balance Overall balance assessment: Needs assistance Sitting-balance support: Feet supported Sitting balance-Leahy Scale: Good     Standing balance support: Bilateral upper extremity supported;During functional activity Standing balance-Leahy Scale: Fair                               Pertinent Vitals/Pain Pain Assessment: Faces Faces Pain Scale: No hurt    Home Living Family/patient expects to be discharged to:: Private residence Living Arrangements: Spouse/significant other;Children Available Help at Discharge: Family;Available PRN/intermittently Type of Home: House Home Access: Stairs to enter Entrance Stairs-Rails: Can reach both Entrance Stairs-Number of Steps: 2 Home Layout: One level Home Equipment: Walker - 4 wheels;Cane - single point Additional Comments: pt is unsteady on the hallway, requires assistance to control all standing and gait balance skills    Prior Function Level of Independence: Needs assistance   Gait / Transfers Assistance Needed: Rollator and SPC, prefers cane  ADL's / Homemaking Assistance Needed: Pt. was independent with morning ADLs, assist at times for IADLs        Hand Dominance   Dominant Hand: Right    Extremity/Trunk Assessment   Upper Extremity Assessment Upper Extremity Assessment: Generalized weakness  Lower Extremity Assessment Lower Extremity Assessment: Generalized weakness    Cervical / Trunk Assessment Cervical / Trunk Assessment: Kyphotic  Communication   Communication: Expressive difficulties   Cognition Arousal/Alertness: Awake/alert Behavior During Therapy: WFL for tasks assessed/performed;Impulsive Overall Cognitive Status: History of cognitive impairments - at baseline                                 General Comments: has trouble with understanding explanations at times      General Comments General comments (skin integrity, edema, etc.): pt is up to walk with assist, controlled balance on RW but also cues, reminders for posture and obstacle avoidance    Exercises     Assessment/Plan    PT Assessment Patient needs continued PT services  PT Problem List Decreased strength;Decreased activity tolerance;Decreased balance;Decreased range of motion;Decreased mobility;Decreased coordination;Decreased cognition;Decreased knowledge of use of DME;Decreased safety awareness;Decreased knowledge of precautions;Cardiopulmonary status limiting activity       PT Treatment Interventions DME instruction;Gait training;Stair training;Functional mobility training;Therapeutic activities;Therapeutic exercise;Balance training;Neuromuscular re-education;Patient/family education    PT Goals (Current goals can be found in the Care Plan section)  Acute Rehab PT Goals Patient Stated Goal: Pt. go home PT Goal Formulation: With patient/family Time For Goal Achievement: 06/29/20 Potential to Achieve Goals: Good    Frequency 7X/week   Barriers to discharge Inaccessible home environment;Decreased caregiver support home with husband, stairs to enter  house    Co-evaluation   Reason for Co-Treatment: Complexity of the patient's impairments (multi-system involvement);To address functional/ADL transfers   OT goals addressed during session: ADL's and self-care;Strengthening/ROM       AM-PAC PT "6 Clicks" Mobility  Outcome Measure Help needed turning from your back to your side while in a flat bed without using bedrails?: A Little Help needed moving from lying on your back to  sitting on the side of a flat bed without using bedrails?: A Little Help needed moving to and from a bed to a chair (including a wheelchair)?: A Little Help needed standing up from a chair using your arms (e.g., wheelchair or bedside chair)?: A Little Help needed to walk in hospital room?: A Little Help needed climbing 3-5 steps with a railing? : A Lot 6 Click Score: 17    End of Session Equipment Utilized During Treatment: Gait belt Activity Tolerance: Patient tolerated treatment well;Treatment limited secondary to medical complications (Comment) Patient left: in bed;with call bell/phone within reach;with bed alarm set;with family/visitor present Nurse Communication: Mobility status;Other (comment) (discharge plans) PT Visit Diagnosis: Unsteadiness on feet (R26.81);Muscle weakness (generalized) (M62.81);Other symptoms and signs involving the nervous system (R29.898)    Time: 1110-1144 PT Time Calculation (min) (ACUTE ONLY): 34 min   Charges:   PT Evaluation $PT Eval Moderate Complexity: 1 Mod PT Treatments $Gait Training: 8-22 mins       Ramond Dial 06/15/2020, 3:39 PM  Mee Hives, PT MS Acute Rehab Dept. Number: [place] and [place]

## 2020-06-15 NOTE — Progress Notes (Signed)
Subjective:  Still periodic aphasia. MRI with L frontal stroke.   Past Medical History:  Diagnosis Date  . Anxiety   . Benign neoplasm of colon 03/27/2014  . Brain aneurysm 2006  . Cerebrovascular disease 06/28/2014  . Chronic kidney disease   . Dementia, vascular ([place]) 06/19/2014  . Depression   . Hyperlipidemia 02/20/2019  . Pure hypercholesterolemia 03/27/2014  . Receptive aphasia   . Sleep apnea   . Stroke ([place])   . Vascular dementia ([place])   . Wears dentures     Past Surgical History:  Procedure Laterality Date  . CATARACT EXTRACTION W/PHACO Left 04/27/2018   Procedure: CATARACT EXTRACTION PHACO AND INTRAOCULAR LENS PLACEMENT ([place]) LEFT;  Surgeon: Leandrew Koyanagi, MD;  Location: [place] [facility];  Service: Ophthalmology;  Laterality: Left;  . CATARACT EXTRACTION W/PHACO Right 06/14/2018   Procedure: CATARACT EXTRACTION PHACO AND INTRAOCULAR LENS PLACEMENT ([place])  RIGHT;  Surgeon: Leandrew Koyanagi, MD;  Location: [facility];  Service: Ophthalmology;  Laterality: Right;  . CEREBRAL ANEURYSM REPAIR      Family History  Problem Relation Age of Onset  . Dementia Mother   . Heart attack Brother     Social History:  reports that she has been smoking cigarettes. She has been smoking about 1.00 pack per day. She has never used smokeless tobacco. She reports previous alcohol use. She reports that she does not use drugs.  No Known Allergies  Medications: I have reviewed the patient's current medications.  Physical Examination: Blood pressure (!) 175/100, pulse 67, temperature (!) 97.5 F (36.4 C), temperature source Oral, resp. rate 20, height 5\' 6"  (1.676 m), weight 63.5 kg, SpO2 91 %.   Neurological Examination   Mental Status: Alert, oriented, thought content appropriate.  Periods of word finding difficulty  Cranial Nerves: II: Discs flat bilaterally; Visual fields grossly normal, pupils equal, round, reactive to light and accommodation III,IV, VI: ptosis not  present, extra-ocular motions intact bilaterally V,VII: smile symmetric, facial light touch sensation normal bilaterally VIII: hearing normal bilaterally IX,X: gag reflex present XI: bilateral shoulder shrug XII: midline tongue extension Motor: Right : Upper extremity   5/5    Left:     Upper extremity   5/5  Lower extremity   5/5     Lower extremity   5/5 Tone and bulk:normal tone throughout; no atrophy noted Sensory: Pinprick and light touch intact throughout, bilaterally Deep Tendon Reflexes: 1+ and symmetric throughout    Laboratory Studies:   Basic Metabolic Panel: Recent Labs  Lab 06/14/20 1127  NA 138  K 4.0  CL 105  CO2 25  GLUCOSE 117*  BUN 27*  CREATININE 1.70*  CALCIUM 9.2    Liver Function Tests: Recent Labs  Lab 06/14/20 1127  AST 18  ALT 10  ALKPHOS 23*  BILITOT 0.9  PROT 7.8  ALBUMIN 3.7   No results for input(s): LIPASE, AMYLASE in the last 168 hours. No results for input(s): AMMONIA in the last 168 hours.  CBC: Recent Labs  Lab 06/14/20 1127  WBC 8.3  NEUTROABS 5.5  HGB 12.4  HCT 37.0  MCV 94.6  PLT 233    Cardiac Enzymes: No results for input(s): CKTOTAL, CKMB, CKMBINDEX, TROPONINI in the last 168 hours.  BNP: Invalid input(s): POCBNP  CBG: Recent Labs  Lab 06/14/20 1132  GLUCAP 101*    Microbiology: Results for orders placed or performed during the hospital encounter of 06/14/20  SARS Coronavirus 2 by RT PCR (hospital order, performed in [facility]  hospital lab) Nasopharyngeal Nasopharyngeal Swab     Status: None   Collection Time: 06/14/20 11:33 AM   Specimen: Nasopharyngeal Swab  Result Value Ref Range Status   SARS Coronavirus 2 NEGATIVE NEGATIVE Final    Comment: (NOTE) SARS-CoV-2 target nucleic acids are NOT DETECTED.  The SARS-CoV-2 RNA is generally detectable in upper and lower respiratory specimens during the acute phase of infection. The lowest concentration of SARS-CoV-2 viral copies this assay can detect  is 250 copies / mL. A negative result does not preclude SARS-CoV-2 infection and should not be used as the sole basis for treatment or other patient management decisions.  A negative result may occur with improper specimen collection / handling, submission of specimen other than nasopharyngeal swab, presence of viral mutation(s) within the areas targeted by this assay, and inadequate number of viral copies (<250 copies / mL). A negative result must be combined with clinical observations, patient history, and epidemiological information.  Fact Sheet for Patients:   StrictlyIdeas.no  Fact Sheet for Healthcare Providers: BankingDealers.co.za  This test is not yet approved or  cleared by the [place] FDA and has been authorized for detection and/or diagnosis of SARS-CoV-2 by FDA under an Emergency Use Authorization (EUA).  This EUA will remain in effect (meaning this test can be used) for the duration of the COVID-19 declaration under Section 564(b)(1) of the Act, 21 U.S.C. section 360bbb-3(b)(1), unless the authorization is terminated or revoked sooner.  Performed at [facility], [address] [place], [place] [address]     Coagulation Studies: Recent Labs    06/14/20 1127  LABPROT 12.4  INR 1.0    Urinalysis: No results for input(s): COLORURINE, LABSPEC, PHURINE, GLUCOSEU, HGBUR, BILIRUBINUR, KETONESUR, PROTEINUR, UROBILINOGEN, NITRITE, LEUKOCYTESUR in the last 168 hours.  Invalid input(s): APPERANCEUR  Lipid Panel:     Component Value Date/Time   CHOL 136 06/15/2020 0526   TRIG 99 06/15/2020 0526   HDL 40 (L) 06/15/2020 0526   CHOLHDL 3.4 06/15/2020 0526   VLDL 20 06/15/2020 0526   LDLCALC 76 06/15/2020 0526    HgbA1C:  Lab Results  Component Value Date   HGBA1C 5.5 06/15/2020    Urine Drug Screen:  No results found for: LABOPIA, COCAINSCRNUR, LABBENZ, AMPHETMU, THCU, LABBARB  Alcohol Level: No  results for input(s): ETH in the last 168 hours.  Other results: EKG: normal EKG, normal sinus rhythm, unchanged from previous tracings.  Imaging: CT Angio Head W or Wo Contrast  Result Date: 06/14/2020 CLINICAL DATA:  Stroke. Facial droop and aphasia. History of aneurysm treatment EXAM: CT ANGIOGRAPHY HEAD AND NECK TECHNIQUE: Multidetector CT imaging of the head and neck was performed using the standard protocol during bolus administration of intravenous contrast. Multiplanar CT image reconstructions and MIPs were obtained to evaluate the vascular anatomy. Carotid stenosis measurements (when applicable) are obtained utilizing NASCET criteria, using the distal internal carotid diameter as the denominator. CONTRAST:  24mL OMNIPAQUE IOHEXOL 350 MG/ML SOLN COMPARISON:  CT head 03/17/2012 FINDINGS: CT HEAD FINDINGS Brain: Progressive atrophy. Extensive chronic microvascular ischemic change throughout the white matter with progression. Aneurysm coiling left MCA bifurcation region with streak artifact. Chronic encephalomalacia left temporal lobe with volume loss and enlargement of the left temporal horn similar to the prior study. Negative for acute infarct or hemorrhage. Suprasellar mass lesion measuring 5 x 6 mm. This shows homogeneous enhancement. The lesion is best seen on sagittal images. Vascular: Negative for hyperdense vessel Skull: Negative Sinuses: Visualized paranasal sinuses clear. Orbits: Bilateral  cataract extraction Review of the MIP images confirms the above findings CTA NECK FINDINGS Aortic arch: Atherosclerotic calcification aortic arch. Left vertebral artery origin from the arch. Atherosclerotic calcification and mild stenosis proximal left subclavian artery. Mild stenosis at the origin of the left common carotid artery. Right carotid system: Mild atherosclerotic disease right carotid bifurcation without significant stenosis. Left carotid system: Atherosclerotic disease left carotid bifurcation.  Left internal carotid artery narrowed to 2.4 mm in minimal diameter corresponding to 47% stenosis. Vertebral arteries: Left vertebral artery dominant and patent without significant stenosis. Small right vertebral artery ends in PICA. Skeleton: Cervical degenerative changes with prominent facet degeneration bilaterally right greater than left. No acute skeletal abnormality. Other neck: Negative for mass or adenopathy in the neck. Upper chest: Apical emphysema and subpleural blebs. Apical scarring bilaterally. No acute abnormality. Atherosclerotic aortic arch. Review of the MIP images confirms the above findings CTA HEAD FINDINGS Anterior circulation: Mild atherosclerotic disease in the cavernous carotid bilaterally without stenosis. Anterior and middle cerebral arteries patent bilaterally without stenosis. Aneurysm coiling left MCA bifurcation. There is streak artifact from the coil however no aneurysm is identified filling with contrast. Posterior circulation: Left vertebral artery supplies the basilar. Left PICA patent. Right vertebral artery ends in PICA. Basilar patent. AICA, superior cerebellar arteries patent bilaterally. Fetal origin of the posterior cerebral artery bilaterally without stenosis or occlusion. No aneurysm in the posterior circulation. Venous sinuses: Normal venous enhancement Anatomic variants: None Review of the MIP images confirms the above findings IMPRESSION: 1. Negative for acute infarct or hemorrhage 2. Progression of atrophy and chronic ischemic change since 2013. 3. 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 No other aneurysm is identified 4. Negative for intracranial large vessel occlusion 5. Mild atherosclerotic disease the carotid bifurcation bilaterally. No significant right carotid stenosis. 47% diameter stenosis proximal left internal carotid artery. No significant vertebral artery stenosis. Right vertebral artery ends in PICA. 6. 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 Recommend MRI brain without and with contrast. Electronically Signed   By: Franchot Gallo M.D.   On: 06/14/2020 13:07   CT Angio Neck W and/or Wo Contrast  Result Date: 06/14/2020 CLINICAL DATA:  Stroke. Facial droop and aphasia. History of aneurysm treatment EXAM: CT ANGIOGRAPHY HEAD AND NECK TECHNIQUE: Multidetector CT imaging of the head and neck was performed using the standard protocol during bolus administration of intravenous contrast. Multiplanar CT image reconstructions and MIPs were obtained to evaluate the vascular anatomy. Carotid stenosis measurements (when applicable) are obtained utilizing NASCET criteria, using the distal internal carotid diameter as the denominator. CONTRAST:  64mL OMNIPAQUE IOHEXOL 350 MG/ML SOLN COMPARISON:  CT head 03/17/2012 FINDINGS: CT HEAD FINDINGS Brain: Progressive atrophy. Extensive chronic microvascular ischemic change throughout the white matter with progression. Aneurysm coiling left MCA bifurcation region with streak artifact. Chronic encephalomalacia left temporal lobe with volume loss and enlargement of the left temporal horn similar to the prior study. Negative for acute infarct or hemorrhage. Suprasellar mass lesion measuring 5 x 6 mm. This shows homogeneous enhancement. The lesion is best seen on sagittal images. Vascular: Negative for hyperdense vessel Skull: Negative Sinuses: Visualized paranasal sinuses clear. Orbits: Bilateral cataract extraction Review of the MIP images confirms the above findings CTA NECK FINDINGS Aortic arch: Atherosclerotic calcification aortic arch. Left vertebral artery origin from the arch. Atherosclerotic calcification and mild stenosis proximal left subclavian artery. Mild stenosis at the origin of the left common carotid artery. Right  carotid system: Mild atherosclerotic disease right carotid bifurcation without significant stenosis. Left carotid system:  Atherosclerotic disease left carotid bifurcation. Left internal carotid artery narrowed to 2.4 mm in minimal diameter corresponding to 47% stenosis. Vertebral arteries: Left vertebral artery dominant and patent without significant stenosis. Small right vertebral artery ends in PICA. Skeleton: Cervical degenerative changes with prominent facet degeneration bilaterally right greater than left. No acute skeletal abnormality. Other neck: Negative for mass or adenopathy in the neck. Upper chest: Apical emphysema and subpleural blebs. Apical scarring bilaterally. No acute abnormality. Atherosclerotic aortic arch. Review of the MIP images confirms the above findings CTA HEAD FINDINGS Anterior circulation: Mild atherosclerotic disease in the cavernous carotid bilaterally without stenosis. Anterior and middle cerebral arteries patent bilaterally without stenosis. Aneurysm coiling left MCA bifurcation. There is streak artifact from the coil however no aneurysm is identified filling with contrast. Posterior circulation: Left vertebral artery supplies the basilar. Left PICA patent. Right vertebral artery ends in PICA. Basilar patent. AICA, superior cerebellar arteries patent bilaterally. Fetal origin of the posterior cerebral artery bilaterally without stenosis or occlusion. No aneurysm in the posterior circulation. Venous sinuses: Normal venous enhancement Anatomic variants: None Review of the MIP images confirms the above findings IMPRESSION: 1. Negative for acute infarct or hemorrhage 2. Progression of atrophy and chronic ischemic change since 2013. 3. 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 No other aneurysm is identified 4. Negative for intracranial large vessel occlusion 5. Mild atherosclerotic disease the carotid bifurcation bilaterally. No significant right carotid stenosis. 47% diameter stenosis proximal left internal carotid artery. No significant vertebral artery stenosis. Right vertebral  artery ends in PICA. 6. 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 Recommend MRI brain without and with contrast. Electronically Signed   By: Franchot Gallo M.D.   On: 06/14/2020 13:07   MR Brain W and Wo Contrast  Result Date: 06/14/2020 CLINICAL DATA:  Speech difficulty.  History of aneurysm repair EXAM: MRI HEAD WITHOUT AND WITH CONTRAST TECHNIQUE: Multiplanar, multiecho pulse sequences of the brain and surrounding structures were obtained without and with intravenous contrast. CONTRAST:  23mL GADAVIST GADOBUTROL 1 MMOL/ML IV SOLN COMPARISON:  CT angio head and neck today FINDINGS: Brain: Acute infarct left mid frontal gyrus. No associated hemorrhage. No other acute infarct. Chronic hemorrhagic infarct left temporal lobe. Prior coiling of left MCA aneurysm. Moderate atrophy. Extensive chronic microvascular ischemic change in the white matter and pons. Small chronic infarcts in the right cerebellum. Chronic infarcts in the basal ganglia bilaterally and thalamus bilaterally. Small enhancing suprasellar mass measuring approximately 5 x 6 mm as noted on CT. This appears separate from the pituitary and is in the midline near the infundibulum. Anatomic detail is limited due to motion and lack of pituitary protocol. The pituitary is normal in size Vascular: Normal arterial flow voids. Coiling of left MCA bifurcation aneurysm. Skull and upper cervical spine: No focal skeletal lesion. Sinuses/Orbits: Mild mucosal edema paranasal sinuses. Bilateral cataract extraction Other: None IMPRESSION: Acute infarct in the left middle frontal gyrus. Atrophy and extensive chronic ischemic change. Chronic hemorrhagic infarct left temporal lobe. 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 Given the enhancing nature, Rathke's cleft cyst not considered likely. Possibilities include sarcoidosis, lymphoma, metastatic disease, craniopharyngioma, and other inflammatory processes  of the infundibulum. Electronically Signed   By: Franchot Gallo M.D.   On: 06/14/2020 16:17     Assessment/Plan:  74 y.o. female with medical  history significant of hypertension, hyperlipidemia, stroke, depression, anxiety, vascular dementia, OSA, CKD-3, s/p of brain aneurysm repair, tobacco abuse, sCHF with EF 30%, who presents with aphasia that started over 24hrs prior to presentation.    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

## 2020-06-15 NOTE — Progress Notes (Signed)
Administered med for increased BP. Will monitor for efficacy

## 2020-06-15 NOTE — Discharge Summary (Signed)
Physician Discharge Summary  IMMACULATE CRUTCHER XFG:[id number] DOB: 1946-10-03 DOA: 06/14/2020  PCP: Sofie Hartigan, MD  Admit date: 06/14/2020 Discharge date: 06/20/2020  Admitted From: home Disposition:  home  Recommendations for Outpatient Follow-up:  1. Follow up with PCP in 1-2 weeks 2. Please obtain BMP/CBC in one week 3. Please follow up with neurology  Home Health: PT, OT, SLP Equipment/Devices: None   Discharge Condition: Stable  CODE STATUS: Full  Diet recommendation: Heart Healthy    Discharge Diagnoses: Principal Problem:   Stroke [facility]) Active Problems:   Depression   Hyperlipidemia   Essential hypertension   CKD (chronic kidney disease), stage IIIa   Chronic systolic CHF (congestive heart failure) (HCC)   Tobacco abuse   Aphasia   Brain mass    Summary of HPI and Hospital Course:  From H&P by Dr. Blaine Hamper of 7/2:  "COZETTE BRAGGS is a 74 y.o. female with medical history significant of hypertension, hyperlipidemia, stroke, depression, anxiety, vascular dementia, OSA, CKD-3, s/p of brain aneurysm repair, tobacco abuse, sCHF with EF 30%, who presents with aphasia.  Per her husband, pt was last known normal Wednesday night, but woke up with aphasia in Thursday morning.  Husband states that patient has aphasia from previous stroke, but her aphasia is much worse since yesterday.  He could not understand what patient is saying to hime which is new.  Patient does not have unilateral weakness, numbness or tingling extremities.  No obvious facial droop.  No vision loss or hearing loss.  Patient does not have chest pain, shortness breath, cough, fever or chills.  No nausea vomiting, diarrhea, abdominal pain, symptoms of UTI.  Husband states that patient ran out of Plavix for a while.  ED Course: pt was found to have WBC 8.3, negative COVID-19 PCR, slightly worsening renal function, INR 1.0, PTT 30, temperature normal, blood pressure 166/84, heart rate 69, RR 18, oxygen saturation  94% on room air.  Patient is placed on progressive bed for observation.  Neurologist, Dr. Irish Elders is consulted."  CTA of neck and head  1. Negative for acute infarct or hemorrhage 2. Progression of atrophy and chronic ischemic change since 2013. 3. 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 No other aneurysm is identified 4. Negative for intracranial large vessel occlusion 5. Mild atherosclerotic disease the carotid bifurcation bilaterally. No significant right carotid stenosis. 47% diameter stenosis proximal left internal carotid artery. No significant vertebral artery stenosis. Right vertebral artery ends in PICA. 6. 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 Recommend MRI brain without and with contrast.  MRI-brain w/ and w/o contrat: 1. Acute infarct in the left middle frontal gyrus. 2. Atrophy and extensive chronic ischemic change. Chronic hemorrhagic infarct left temporal lobe. 3. 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  Given the enhancing nature, Rathke's cleft cyst not considered likely. Possibilities include sarcoidosis, lymphoma, metastatic disease, craniopharyngioma, and other inflammatory processes of the infundibulum."    Neurology suspected the mass to be incidental finding, and since very small can be followed up outpatient.  Speech therapy evaluated and home SLP was recommended and arranged, as were home health PT and OT.  Patient was resumed on Plavix, counseled on importance of taking it regularly.     Discharge Instructions   Discharge Instructions    Call MD for:  extreme fatigue   Complete by: As directed    Call MD for:  persistant  dizziness or light-headedness   Complete by: As directed    Call MD for:  persistant nausea and vomiting   Complete by: As directed    Call MD for:  severe uncontrolled pain   Complete by: As directed    Call MD for:  temperature  >100.4   Complete by: As directed    Diet - low sodium heart healthy   Complete by: As directed    Diet - low sodium heart healthy   Complete by: As directed    Discharge instructions   Complete by: As directed    Please work with home health therapy and do the exercises they teach you at least twice a day between their visits.  Please take your Plavix and Lipitor (atorvastatin) every day to prevent future strokes.   Discharge instructions   Complete by: As directed    Please take your Plavix and Lipitor every day to prevent future strokes.  Work with home health therapy and be sure to do the exercises they teach you at least twice a day in between their visits.   Increase activity slowly   Complete by: As directed    Increase activity slowly   Complete by: As directed      Allergies as of 06/15/2020   No Known Allergies     Medication List    STOP taking these medications   simvastatin 80 MG tablet Commonly known as: ZOCOR Replaced by: atorvastatin 40 MG tablet     TAKE these medications   atorvastatin 40 MG tablet Commonly known as: LIPITOR Take 1 tablet (40 mg total) by mouth daily. Replaces: simvastatin 80 MG tablet   citalopram 20 MG tablet Commonly known as: CELEXA Take 20 mg by mouth daily.   clopidogrel 75 MG tablet Commonly known as: PLAVIX Take 1 tablet (75 mg total) by mouth daily.   losartan 50 MG tablet Commonly known as: COZAAR Take 50 mg by mouth daily.   mirabegron ER 50 MG Tb24 tablet Commonly known as: MYRBETRIQ Take 1 tablet (50 mg total) by mouth daily.   multivitamin-lutein Caps capsule Take 1 capsule by mouth daily.   Vitamin D (Ergocalciferol) 1.25 MG (50000 UNIT) Caps capsule Commonly known as: DRISDOL Take 50,000 Units by mouth once a week.       No Known Allergies  Consultations:  Neurology   Procedures/Studies: CT Angio Head W or Wo Contrast  Result Date: 06/14/2020 CLINICAL DATA:  Stroke. Facial droop and aphasia.  History of aneurysm treatment EXAM: CT ANGIOGRAPHY HEAD AND NECK TECHNIQUE: Multidetector CT imaging of the head and neck was performed using the standard protocol during bolus administration of intravenous contrast. Multiplanar CT image reconstructions and MIPs were obtained to evaluate the vascular anatomy. Carotid stenosis measurements (when applicable) are obtained utilizing NASCET criteria, using the distal internal carotid diameter as the denominator. CONTRAST:  27mL OMNIPAQUE IOHEXOL 350 MG/ML SOLN COMPARISON:  CT head 03/17/2012 FINDINGS: CT HEAD FINDINGS Brain: Progressive atrophy. Extensive chronic microvascular ischemic change throughout the white matter with progression. Aneurysm coiling left MCA bifurcation region with streak artifact. Chronic encephalomalacia left temporal lobe with volume loss and enlargement of the left temporal horn similar to the prior study. Negative for acute infarct or hemorrhage. Suprasellar mass lesion measuring 5 x 6 mm. This shows homogeneous enhancement. The lesion is best seen on sagittal images. Vascular: Negative for hyperdense vessel Skull: Negative Sinuses: Visualized paranasal sinuses clear. Orbits: Bilateral cataract extraction Review of the MIP images confirms the above findings  CTA NECK FINDINGS Aortic arch: Atherosclerotic calcification aortic arch. Left vertebral artery origin from the arch. Atherosclerotic calcification and mild stenosis proximal left subclavian artery. Mild stenosis at the origin of the left common carotid artery. Right carotid system: Mild atherosclerotic disease right carotid bifurcation without significant stenosis. Left carotid system: Atherosclerotic disease left carotid bifurcation. Left internal carotid artery narrowed to 2.4 mm in minimal diameter corresponding to 47% stenosis. Vertebral arteries: Left vertebral artery dominant and patent without significant stenosis. Small right vertebral artery ends in PICA. Skeleton: Cervical  degenerative changes with prominent facet degeneration bilaterally right greater than left. No acute skeletal abnormality. Other neck: Negative for mass or adenopathy in the neck. Upper chest: Apical emphysema and subpleural blebs. Apical scarring bilaterally. No acute abnormality. Atherosclerotic aortic arch. Review of the MIP images confirms the above findings CTA HEAD FINDINGS Anterior circulation: Mild atherosclerotic disease in the cavernous carotid bilaterally without stenosis. Anterior and middle cerebral arteries patent bilaterally without stenosis. Aneurysm coiling left MCA bifurcation. There is streak artifact from the coil however no aneurysm is identified filling with contrast. Posterior circulation: Left vertebral artery supplies the basilar. Left PICA patent. Right vertebral artery ends in PICA. Basilar patent. AICA, superior cerebellar arteries patent bilaterally. Fetal origin of the posterior cerebral artery bilaterally without stenosis or occlusion. No aneurysm in the posterior circulation. Venous sinuses: Normal venous enhancement Anatomic variants: None Review of the MIP images confirms the above findings IMPRESSION: 1. Negative for acute infarct or hemorrhage 2. Progression of atrophy and chronic ischemic change since 2013. 3. 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 No other aneurysm is identified 4. Negative for intracranial large vessel occlusion 5. Mild atherosclerotic disease the carotid bifurcation bilaterally. No significant right carotid stenosis. 47% diameter stenosis proximal left internal carotid artery. No significant vertebral artery stenosis. Right vertebral artery ends in PICA. 6. 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 Recommend MRI brain without and with contrast. Electronically Signed   By: Franchot Gallo M.D.   On: 06/14/2020 13:07   CT Angio Neck W and/or Wo Contrast  Result Date:  06/14/2020 CLINICAL DATA:  Stroke. Facial droop and aphasia. History of aneurysm treatment EXAM: CT ANGIOGRAPHY HEAD AND NECK TECHNIQUE: Multidetector CT imaging of the head and neck was performed using the standard protocol during bolus administration of intravenous contrast. Multiplanar CT image reconstructions and MIPs were obtained to evaluate the vascular anatomy. Carotid stenosis measurements (when applicable) are obtained utilizing NASCET criteria, using the distal internal carotid diameter as the denominator. CONTRAST:  35mL OMNIPAQUE IOHEXOL 350 MG/ML SOLN COMPARISON:  CT head 03/17/2012 FINDINGS: CT HEAD FINDINGS Brain: Progressive atrophy. Extensive chronic microvascular ischemic change throughout the white matter with progression. Aneurysm coiling left MCA bifurcation region with streak artifact. Chronic encephalomalacia left temporal lobe with volume loss and enlargement of the left temporal horn similar to the prior study. Negative for acute infarct or hemorrhage. Suprasellar mass lesion measuring 5 x 6 mm. This shows homogeneous enhancement. The lesion is best seen on sagittal images. Vascular: Negative for hyperdense vessel Skull: Negative Sinuses: Visualized paranasal sinuses clear. Orbits: Bilateral cataract extraction Review of the MIP images confirms the above findings CTA NECK FINDINGS Aortic arch: Atherosclerotic calcification aortic arch. Left vertebral artery origin from the arch. Atherosclerotic calcification and mild stenosis proximal left subclavian artery. Mild stenosis at the origin of the left common carotid artery. Right carotid system: Mild atherosclerotic disease right carotid bifurcation without significant stenosis.  Left carotid system: Atherosclerotic disease left carotid bifurcation. Left internal carotid artery narrowed to 2.4 mm in minimal diameter corresponding to 47% stenosis. Vertebral arteries: Left vertebral artery dominant and patent without significant stenosis. Small  right vertebral artery ends in PICA. Skeleton: Cervical degenerative changes with prominent facet degeneration bilaterally right greater than left. No acute skeletal abnormality. Other neck: Negative for mass or adenopathy in the neck. Upper chest: Apical emphysema and subpleural blebs. Apical scarring bilaterally. No acute abnormality. Atherosclerotic aortic arch. Review of the MIP images confirms the above findings CTA HEAD FINDINGS Anterior circulation: Mild atherosclerotic disease in the cavernous carotid bilaterally without stenosis. Anterior and middle cerebral arteries patent bilaterally without stenosis. Aneurysm coiling left MCA bifurcation. There is streak artifact from the coil however no aneurysm is identified filling with contrast. Posterior circulation: Left vertebral artery supplies the basilar. Left PICA patent. Right vertebral artery ends in PICA. Basilar patent. AICA, superior cerebellar arteries patent bilaterally. Fetal origin of the posterior cerebral artery bilaterally without stenosis or occlusion. No aneurysm in the posterior circulation. Venous sinuses: Normal venous enhancement Anatomic variants: None Review of the MIP images confirms the above findings IMPRESSION: 1. Negative for acute infarct or hemorrhage 2. Progression of atrophy and chronic ischemic change since 2013. 3. 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 No other aneurysm is identified 4. Negative for intracranial large vessel occlusion 5. Mild atherosclerotic disease the carotid bifurcation bilaterally. No significant right carotid stenosis. 47% diameter stenosis proximal left internal carotid artery. No significant vertebral artery stenosis. Right vertebral artery ends in PICA. 6. 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 Recommend MRI brain without and with contrast. Electronically Signed   By: Franchot Gallo M.D.   On: 06/14/2020 13:07   MR  Brain W and Wo Contrast  Result Date: 06/14/2020 CLINICAL DATA:  Speech difficulty.  History of aneurysm repair EXAM: MRI HEAD WITHOUT AND WITH CONTRAST TECHNIQUE: Multiplanar, multiecho pulse sequences of the brain and surrounding structures were obtained without and with intravenous contrast. CONTRAST:  60mL GADAVIST GADOBUTROL 1 MMOL/ML IV SOLN COMPARISON:  CT angio head and neck today FINDINGS: Brain: Acute infarct left mid frontal gyrus. No associated hemorrhage. No other acute infarct. Chronic hemorrhagic infarct left temporal lobe. Prior coiling of left MCA aneurysm. Moderate atrophy. Extensive chronic microvascular ischemic change in the white matter and pons. Small chronic infarcts in the right cerebellum. Chronic infarcts in the basal ganglia bilaterally and thalamus bilaterally. Small enhancing suprasellar mass measuring approximately 5 x 6 mm as noted on CT. This appears separate from the pituitary and is in the midline near the infundibulum. Anatomic detail is limited due to motion and lack of pituitary protocol. The pituitary is normal in size Vascular: Normal arterial flow voids. Coiling of left MCA bifurcation aneurysm. Skull and upper cervical spine: No focal skeletal lesion. Sinuses/Orbits: Mild mucosal edema paranasal sinuses. Bilateral cataract extraction Other: None IMPRESSION: Acute infarct in the left middle frontal gyrus. Atrophy and extensive chronic ischemic change. Chronic hemorrhagic infarct left temporal lobe. 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 Given the enhancing nature, Rathke's cleft cyst not considered likely. Possibilities include sarcoidosis, lymphoma, metastatic disease, craniopharyngioma, and other inflammatory processes of the infundibulum. Electronically Signed   By: Franchot Gallo M.D.   On: 06/14/2020 16:17         Subjective: Pt seen at bedside.  No acute events reported.  Wants to go home  and family agreeable.  She denies pain, new or worsening  neurologic symptoms, headache or other complaints.   Discharge Exam: Vitals:   06/15/20 1222 06/15/20 1222  BP: 131/72 131/72  Pulse: 76 76  Resp: 16 16  Temp: 97.9 F (36.6 C) 97.9 F (36.6 C)  SpO2: 97%    Vitals:   06/15/20 0357 06/15/20 0814 06/15/20 1222 06/15/20 1222  BP: (!) 135/57 (!) 175/100 131/72 131/72  Pulse: 65 67 76 76  Resp: 17 20 16 16   Temp: 97.9 F (36.6 C) (!) 97.5 F (36.4 C) 97.9 F (36.6 C) 97.9 F (36.6 C)  TempSrc:  Oral Oral Oral  SpO2: 92% 91% 97%   Weight:      Height:        General: Pt is alert, awake, not in acute distress Cardiovascular: RRR, S1/S2 +, no rubs, no gallops Respiratory: CTA bilaterally, no wheezing, no rhonchi Abdominal: Soft, NT, ND, bowel sounds + Extremities: no edema, no cyanosis    The results of significant diagnostics from this hospitalization (including imaging, microbiology, ancillary and laboratory) are listed below for reference.     Microbiology: Recent Results (from the past 240 hour(s))  SARS Coronavirus 2 by RT PCR (hospital order, performed in [facility] hospital lab) Nasopharyngeal Nasopharyngeal Swab     Status: None   Collection Time: 06/14/20 11:33 AM   Specimen: Nasopharyngeal Swab  Result Value Ref Range Status   SARS Coronavirus 2 NEGATIVE NEGATIVE Final    Comment: (NOTE) SARS-CoV-2 target nucleic acids are NOT DETECTED.  The SARS-CoV-2 RNA is generally detectable in upper and lower respiratory specimens during the acute phase of infection. The lowest concentration of SARS-CoV-2 viral copies this assay can detect is 250 copies / mL. A negative result does not preclude SARS-CoV-2 infection and should not be used as the sole basis for treatment or other patient management decisions.  A negative result may occur with improper specimen collection / handling, submission of specimen other than nasopharyngeal swab, presence of viral mutation(s) within the areas targeted by this assay, and  inadequate number of viral copies (<250 copies / mL). A negative result must be combined with clinical observations, patient history, and epidemiological information.  Fact Sheet for Patients:   StrictlyIdeas.no  Fact Sheet for Healthcare Providers: BankingDealers.co.za  This test is not yet approved or  cleared by the [place] FDA and has been authorized for detection and/or diagnosis of SARS-CoV-2 by FDA under an Emergency Use Authorization (EUA).  This EUA will remain in effect (meaning this test can be used) for the duration of the COVID-19 declaration under Section 564(b)(1) of the Act, 21 U.S.C. section 360bbb-3(b)(1), unless the authorization is terminated or revoked sooner.  Performed at [facility], [address] [place], [place] [address]      Labs: BNP (last 3 results) Recent Labs    06/14/20 1450  BNP 67.6   Basic Metabolic Panel: Recent Labs  Lab 06/14/20 1127  NA 138  K 4.0  CL 105  CO2 25  GLUCOSE 117*  BUN 27*  CREATININE 1.70*  CALCIUM 9.2   Liver Function Tests: Recent Labs  Lab 06/14/20 1127  AST 18  ALT 10  ALKPHOS 23*  BILITOT 0.9  PROT 7.8  ALBUMIN 3.7   No results for input(s): LIPASE, AMYLASE in the last 168 hours. No results for input(s): AMMONIA in the last 168 hours. CBC: Recent Labs  Lab 06/14/20 1127  WBC 8.3  NEUTROABS 5.5  HGB  12.4  HCT 37.0  MCV 94.6  PLT 233   Cardiac Enzymes: No results for input(s): CKTOTAL, CKMB, CKMBINDEX, TROPONINI in the last 168 hours. BNP: Invalid input(s): POCBNP CBG: Recent Labs  Lab 06/14/20 1132  GLUCAP 101*   D-Dimer No results for input(s): DDIMER in the last 72 hours. Hgb A1c No results for input(s): HGBA1C in the last 72 hours. Lipid Profile No results for input(s): CHOL, HDL, LDLCALC, TRIG, CHOLHDL, LDLDIRECT in the last 72 hours. Thyroid function studies No results for input(s): TSH, T4TOTAL, T3FREE,  THYROIDAB in the last 72 hours.  Invalid input(s): FREET3 Anemia work up No results for input(s): VITAMINB12, FOLATE, FERRITIN, TIBC, IRON, RETICCTPCT in the last 72 hours. Urinalysis    Component Value Date/Time   COLORURINE YELLOW 04/30/2020 0830   APPEARANCEUR CLEAR 04/30/2020 0830   APPEARANCEUR Cloudy (A) 04/11/2018 1601   LABSPEC 1.020 04/30/2020 0830   PHURINE 6.0 04/30/2020 0830   GLUCOSEU NEGATIVE 04/30/2020 0830   HGBUR NEGATIVE 04/30/2020 0830   BILIRUBINUR NEGATIVE 04/30/2020 0830   BILIRUBINUR Negative 04/11/2018 1601   KETONESUR NEGATIVE 04/30/2020 0830   PROTEINUR NEGATIVE 04/30/2020 0830   NITRITE NEGATIVE 04/30/2020 0830   LEUKOCYTESUR NEGATIVE 04/30/2020 0830   Sepsis Labs Invalid input(s): PROCALCITONIN,  WBC,  LACTICIDVEN Microbiology Recent Results (from the past 240 hour(s))  SARS Coronavirus 2 by RT PCR (hospital order, performed in [facility] hospital lab) Nasopharyngeal Nasopharyngeal Swab     Status: None   Collection Time: 06/14/20 11:33 AM   Specimen: Nasopharyngeal Swab  Result Value Ref Range Status   SARS Coronavirus 2 NEGATIVE NEGATIVE Final    Comment: (NOTE) SARS-CoV-2 target nucleic acids are NOT DETECTED.  The SARS-CoV-2 RNA is generally detectable in upper and lower respiratory specimens during the acute phase of infection. The lowest concentration of SARS-CoV-2 viral copies this assay can detect is 250 copies / mL. A negative result does not preclude SARS-CoV-2 infection and should not be used as the sole basis for treatment or other patient management decisions.  A negative result may occur with improper specimen collection / handling, submission of specimen other than nasopharyngeal swab, presence of viral mutation(s) within the areas targeted by this assay, and inadequate number of viral copies (<250 copies / mL). A negative result must be combined with clinical observations, patient history, and epidemiological  information.  Fact Sheet for Patients:   StrictlyIdeas.no  Fact Sheet for Healthcare Providers: BankingDealers.co.za  This test is not yet approved or  cleared by the [place] FDA and has been authorized for detection and/or diagnosis of SARS-CoV-2 by FDA under an Emergency Use Authorization (EUA).  This EUA will remain in effect (meaning this test can be used) for the duration of the COVID-19 declaration under Section 564(b)(1) of the Act, 21 U.S.C. section 360bbb-3(b)(1), unless the authorization is terminated or revoked sooner.  Performed at [facility] [address], [address] [place], [place] [address]      Time coordinating discharge: Over 30 minutes  SIGNED:   Ezekiel Slocumb, DO [facility] 06/20/2020, 9:45 AM   If 7PM-7AM, please contact night-coverage [email or web address]

## 2020-06-15 NOTE — Progress Notes (Signed)
P:rovider ordered med for elevated BP. See [facility]

## 2020-06-15 NOTE — Evaluation (Signed)
Occupational Therapy Evaluation Patient Details Name: Linda [name] MRN: [id number] DOB: [birth year] Today's Date: 06/15/2020    History of Present Illness Pt. is a 74 y.o. female who was admitted with worsening slurred speech. Imaging revealed and Acute Infarct in the Left middle Frontal Gyrus. 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 PMHx includes: HTN, CVA, Depression, Anxiety, Vascular Dementia, OSA, CKD-3, Brain Aneurysm repair, Tobacco Abuse, CHF with EF 30%,  Aphasia.   Clinical Impression   Pt. presents with weakness, limited activity tolerance, impulsivity, and limited functional mobility which hinders her ability to complete basic ADL and IADL functioning. Pt. Resides at home with her husband. Pt. was independent with basic morning ADLs. Pt. required occasional assist with IADL tasks. Pt.'s husband is at home, and assists the pt, as needed. Pt. Requires min guard form ADLs performed in standing. Pt. Could benefit from OT services for ADL training, A/E training, neuromuscular re-ed, and pt. Education about home modification, and DME.  Pt. Plans to return home upon discharge with family to assist pt. as needed. Pt. Could benefit from follow-up [facility] services upon discharge.    Follow Up Recommendations  Home health OT    Equipment Recommendations  None recommended by OT    Recommendations for Other Services       Precautions / Restrictions Precautions Precautions: None Restrictions Weight Bearing Restrictions: No      Mobility Bed Mobility Overal bed mobility: Independent                Transfers Overall transfer level: Needs assistance   Transfers: Sit to/from Stand Sit to Stand: Supervision         General transfer comment: Min guard in standing.    Balance Overall balance assessment: Needs assistance   Sitting balance-Leahy Scale: Good       Standing balance-Leahy Scale: Fair                              ADL either performed or assessed with clinical judgement   ADL Overall ADL's : Needs assistance/impaired Eating/Feeding: Set up   Grooming: Set up;Standing;Minimal assistance   Upper Body Bathing: Set up;Independent   Lower Body Bathing: Set up;Minimal assistance   Upper Body Dressing : Set up;Independent   Lower Body Dressing: Set up;Minimal assistance;Sitting/lateral leans               Functional mobility during ADLs: Min guard       Vision Baseline Vision/History: Wears glasses Patient Visual Report: No change from baseline       Perception     Praxis      Pertinent Vitals/Pain Pain Assessment: No/denies pain     Hand Dominance Right   Extremity/Trunk Assessment Upper Extremity Assessment Upper Extremity Assessment: Generalized weakness           Communication Communication Communication: Receptive difficulties;Expressive difficulties   Cognition Arousal/Alertness: Awake/alert Behavior During Therapy: WFL for tasks assessed/performed;Impulsive Overall Cognitive Status: History of cognitive impairments - at baseline                                     General Comments       Exercises     Shoulder Instructions      Home Living Family/patient expects to be discharged to:: Private residence Living Arrangements: Spouse/significant other;Children Available Help at  Discharge: Family;Available PRN/intermittently Type of Home: House                              Lives With: Spouse    Prior Functioning/Environment Level of Independence: Needs assistance    ADL's / Homemaking Assistance Needed: Pt. was independent with morning ADLs, assist at times for IADLs            OT Problem List: Decreased strength;Decreased activity tolerance;Decreased safety awareness      OT Treatment/Interventions: Self-care/ADL training;Therapeutic exercise;Patient/family education;DME and/or AE instruction;Therapeutic  activities;Neuromuscular education    OT Goals(Current goals can be found in the care plan section) Acute Rehab OT Goals Patient Stated Goal: Pt. go home OT Goal Formulation: With patient Potential to Achieve Goals: Good  OT Frequency: Min 2X/week   Barriers to D/C:            Co-evaluation PT/OT/SLP Co-Evaluation/Treatment: Yes Reason for Co-Treatment: Complexity of the patient's impairments (multi-system involvement);To address functional/ADL transfers   OT goals addressed during session: ADL's and self-care;Strengthening/ROM      AM-PAC OT "6 Clicks" Daily Activity     Outcome Measure Help from another person eating meals?: None Help from another person taking care of personal grooming?: A Little Help from another person toileting, which includes using toliet, bedpan, or urinal?: A Little Help from another person bathing (including washing, rinsing, drying)?: A Little Help from another person to put on and taking off regular upper body clothing?: None Help from another person to put on and taking off regular lower body clothing?: A Little 6 Click Score: 20   End of Session Equipment Utilized During Treatment: Gait belt  Activity Tolerance: Patient tolerated treatment well Patient left: in bed;with call bell/phone within reach;with family/visitor present;with nursing/sitter in room  OT Visit Diagnosis: Muscle weakness (generalized) (M62.81)                Time: 3903-0092 OT Time Calculation (min): 27 min Charges:  OT General Charges $OT Visit: 1 Visit OT Evaluation $OT Eval Moderate Complexity: 1 Mod  Harrel Carina, MS, OTR/L  Harrel Carina 06/15/2020, 2:18 PM

## 2020-06-27 ENCOUNTER — Emergency Department: Payer: Medicare Other

## 2020-06-27 ENCOUNTER — Observation Stay: Payer: Medicare Other

## 2020-06-27 ENCOUNTER — Observation Stay
Admission: EM | Admit: 2020-06-27 | Discharge: 2020-06-28 | Disposition: A | Discharge status: Home / Self Care | Payer: Medicare Other | Attending: Internal Medicine | Admitting: Internal Medicine

## 2020-06-27 ENCOUNTER — Encounter: Payer: Self-pay | Admitting: Intensive Care

## 2020-06-27 ENCOUNTER — Other Ambulatory Visit: Payer: Self-pay

## 2020-06-27 DIAGNOSIS — Z20822 Contact with and (suspected) exposure to covid-19: Secondary | ICD-10-CM | POA: Insufficient documentation

## 2020-06-27 DIAGNOSIS — N1831 Chronic kidney disease, stage 3a: Secondary | ICD-10-CM | POA: Insufficient documentation

## 2020-06-27 DIAGNOSIS — Z87891 Personal history of nicotine dependence: Secondary | ICD-10-CM | POA: Insufficient documentation

## 2020-06-27 DIAGNOSIS — Z8673 Personal history of transient ischemic attack (TIA), and cerebral infarction without residual deficits: Secondary | ICD-10-CM | POA: Insufficient documentation

## 2020-06-27 DIAGNOSIS — I5022 Chronic systolic (congestive) heart failure: Secondary | ICD-10-CM | POA: Insufficient documentation

## 2020-06-27 DIAGNOSIS — I160 Hypertensive urgency: Secondary | ICD-10-CM | POA: Diagnosis not present

## 2020-06-27 DIAGNOSIS — M6281 Muscle weakness (generalized): Secondary | ICD-10-CM | POA: Diagnosis not present

## 2020-06-27 DIAGNOSIS — I130 Hypertensive heart and chronic kidney disease with heart failure and stage 1 through stage 4 chronic kidney disease, or unspecified chronic kidney disease: Secondary | ICD-10-CM | POA: Diagnosis not present

## 2020-06-27 DIAGNOSIS — I639 Cerebral infarction, unspecified: Secondary | ICD-10-CM | POA: Diagnosis present

## 2020-06-27 DIAGNOSIS — Z79899 Other long term (current) drug therapy: Secondary | ICD-10-CM | POA: Insufficient documentation

## 2020-06-27 DIAGNOSIS — Z8679 Personal history of other diseases of the circulatory system: Secondary | ICD-10-CM | POA: Insufficient documentation

## 2020-06-27 DIAGNOSIS — F0390 Unspecified dementia without behavioral disturbance: Secondary | ICD-10-CM | POA: Insufficient documentation

## 2020-06-27 DIAGNOSIS — Z720 Tobacco use: Secondary | ICD-10-CM | POA: Diagnosis present

## 2020-06-27 DIAGNOSIS — N183 Chronic kidney disease, stage 3 unspecified: Secondary | ICD-10-CM | POA: Diagnosis present

## 2020-06-27 DIAGNOSIS — R030 Elevated blood-pressure reading, without diagnosis of hypertension: Secondary | ICD-10-CM | POA: Diagnosis present

## 2020-06-27 LAB — PROTIME-INR
INR: 1 (ref 0.8–1.2)
Prothrombin Time: 12.6 seconds (ref 11.4–15.2)

## 2020-06-27 LAB — COMPREHENSIVE METABOLIC PANEL
ALT: 9 U/L (ref 0–44)
AST: 14 U/L — ABNORMAL LOW (ref 15–41)
Albumin: 3.9 g/dL (ref 3.5–5.0)
Alkaline Phosphatase: 25 U/L — ABNORMAL LOW (ref 38–126)
Anion gap: 8 (ref 5–15)
BUN: 26 mg/dL — ABNORMAL HIGH (ref 8–23)
CO2: 25 mmol/L (ref 22–32)
Calcium: 9 mg/dL (ref 8.9–10.3)
Chloride: 106 mmol/L (ref 98–111)
Creatinine, Ser: 1.64 mg/dL — ABNORMAL HIGH (ref 0.44–1.00)
GFR calc Af Amer: 35 mL/min — ABNORMAL LOW (ref >60)
GFR calc non Af Amer: 30 mL/min — ABNORMAL LOW (ref >60)
Glucose, Bld: 99 mg/dL (ref 70–99)
Potassium: 3.6 mmol/L (ref 3.5–5.1)
Sodium: 139 mmol/L (ref 135–145)
Total Bilirubin: 0.8 mg/dL (ref 0.3–1.2)
Total Protein: 7.7 g/dL (ref 6.5–8.1)

## 2020-06-27 LAB — URINALYSIS, COMPLETE (UACMP) WITH MICROSCOPIC
Bilirubin Urine: NEGATIVE
Glucose, UA: NEGATIVE mg/dL
Ketones, ur: NEGATIVE mg/dL
Leukocytes,Ua: NEGATIVE
Nitrite: NEGATIVE
Protein, ur: NEGATIVE mg/dL
Specific Gravity, Urine: 1.012 (ref 1.005–1.030)
pH: 6 (ref 5.0–8.0)

## 2020-06-27 LAB — CBC WITH DIFFERENTIAL/PLATELET
Abs Immature Granulocytes: 0.03 10*3/uL (ref 0.00–0.07)
Basophils Absolute: 0.1 10*3/uL (ref 0.0–0.1)
Basophils Relative: 1 %
Eosinophils Absolute: 0.2 10*3/uL (ref 0.0–0.5)
Eosinophils Relative: 3 %
HCT: 33.7 % — ABNORMAL LOW (ref 36.0–46.0)
Hemoglobin: 11.1 g/dL — ABNORMAL LOW (ref 12.0–15.0)
Immature Granulocytes: 0 %
Lymphocytes Relative: 35 %
Lymphs Abs: 2.8 10*3/uL (ref 0.7–4.0)
MCH: 31.3 pg (ref 26.0–34.0)
MCHC: 32.9 g/dL (ref 30.0–36.0)
MCV: 94.9 fL (ref 80.0–100.0)
Monocytes Absolute: 0.6 10*3/uL (ref 0.1–1.0)
Monocytes Relative: 8 %
Neutro Abs: 4.2 10*3/uL (ref 1.7–7.7)
Neutrophils Relative %: 53 %
Platelets: 231 10*3/uL (ref 150–400)
RBC: 3.55 MIL/uL — ABNORMAL LOW (ref 3.87–5.11)
RDW: 13.2 % (ref 11.5–15.5)
WBC: 8 10*3/uL (ref 4.0–10.5)
nRBC: 0 % (ref 0.0–0.2)

## 2020-06-27 LAB — TROPONIN I (HIGH SENSITIVITY)
Troponin I (High Sensitivity): 9 ng/L (ref <18)
Troponin I (High Sensitivity): 11 ng/L (ref <18)

## 2020-06-27 LAB — SARS CORONAVIRUS 2 BY RT PCR (HOSPITAL ORDER, PERFORMED IN ~~LOC~~ HOSPITAL LAB): SARS Coronavirus 2: NEGATIVE

## 2020-06-27 MED ORDER — SODIUM CHLORIDE 0.9% FLUSH
3.0000 mL | Freq: Two times a day (BID) | INTRAVENOUS | Status: DC
  Administered 2020-06-28: 3 mL via INTRAVENOUS

## 2020-06-27 MED ORDER — VITAMIN D (ERGOCALCIFEROL) 1.25 MG (50000 UNIT) PO CAPS: 50000.0000 [IU] | ORAL_CAPSULE | ORAL | Status: DC

## 2020-06-27 MED ORDER — ACETAMINOPHEN 160 MG/5ML PO SOLN
650.0000 mg | ORAL | Status: DC | PRN
  Filled 2020-06-27: qty 20.3

## 2020-06-27 MED ORDER — ACETAMINOPHEN 325 MG PO TABS: 650.0000 mg | ORAL_TABLET | ORAL | Status: DC | PRN

## 2020-06-27 MED ORDER — LABETALOL HCL 5 MG/ML IV SOLN
10.0000 mg | Freq: Once | INTRAVENOUS | Status: AC
  Administered 2020-06-27: 10 mg via INTRAVENOUS
  Filled 2020-06-27: qty 4

## 2020-06-27 MED ORDER — ACETAMINOPHEN 650 MG RE SUPP: 650.0000 mg | RECTAL | Status: DC | PRN

## 2020-06-27 MED ORDER — STROKE: EARLY STAGES OF RECOVERY BOOK: Freq: Once | Status: DC

## 2020-06-27 MED ORDER — CITALOPRAM HYDROBROMIDE 20 MG PO TABS
20.0000 mg | ORAL_TABLET | Freq: Every day | ORAL | Status: DC
  Administered 2020-06-28: 20 mg via ORAL
  Filled 2020-06-27: qty 1

## 2020-06-27 MED ORDER — CLOPIDOGREL BISULFATE 75 MG PO TABS
75.0000 mg | ORAL_TABLET | Freq: Every day | ORAL | Status: DC
  Administered 2020-06-28: 75 mg via ORAL
  Filled 2020-06-27: qty 1

## 2020-06-27 MED ORDER — NICARDIPINE HCL IN NACL 20-0.86 MG/200ML-% IV SOLN
3.0000 mg/h | INTRAVENOUS | Status: DC
  Administered 2020-06-27: 5 mg/h via INTRAVENOUS
  Administered 2020-06-28: 2 mg/h via INTRAVENOUS
  Filled 2020-06-27 (×2): qty 200

## 2020-06-27 MED ORDER — HEPARIN SODIUM (PORCINE) 5000 UNIT/ML IJ SOLN
5000.0000 [IU] | Freq: Two times a day (BID) | INTRAMUSCULAR | Status: DC
  Administered 2020-06-28 (×2): 5000 [IU] via SUBCUTANEOUS
  Filled 2020-06-27 (×2): qty 1

## 2020-06-27 MED ORDER — MIRABEGRON ER 50 MG PO TB24
50.0000 mg | ORAL_TABLET | Freq: Every day | ORAL | Status: DC
  Administered 2020-06-28: 50 mg via ORAL
  Filled 2020-06-27: qty 1

## 2020-06-27 MED ORDER — LABETALOL HCL 5 MG/ML IV SOLN: 10.0000 mg | Freq: Once | INTRAVENOUS | Status: DC

## 2020-06-27 MED ORDER — ATORVASTATIN CALCIUM 20 MG PO TABS
40.0000 mg | ORAL_TABLET | Freq: Every day | ORAL | Status: DC
  Administered 2020-06-28: 40 mg via ORAL
  Filled 2020-06-27: qty 2

## 2020-06-27 MED ORDER — OCUVITE-LUTEIN PO CAPS
1.0000 | ORAL_CAPSULE | Freq: Every day | ORAL | Status: DC
  Administered 2020-06-28: 1 via ORAL
  Filled 2020-06-27: qty 1

## 2020-06-27 MED ORDER — SODIUM CHLORIDE 0.9 % IV SOLN: Freq: Once | INTRAVENOUS | Status: AC

## 2020-06-27 NOTE — ED Notes (Signed)
Patients bed lowered per pt request

## 2020-06-27 NOTE — H&P (Signed)
History and Physical    Linda [name]:[id number] DOB: 07-27-1946 DOA: 06/27/2020   PCP: Sofie Hartigan, MD    Patient coming from: Neurology office.   Chief Complaint:  Balance Issue/ HTN urgency.  HPI: Linda [name] is a 74 y.o. 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 Initial arrival BP was 606 systolic. Pt is alert,awake and oriented and answers questions appropriately and follows command.  ED Course:  Blood pressure (!) 169/72, pulse (!) 58, temperature 98.9 F (37.2 C), temperature source Oral, resp. rate 12, height 5\' 6"  (1.676 m), weight 63.5 kg, SpO2 95 %. EKG show SR at 59.Head ct no acute findings of bleed, prior aneurysm and coiling . Labs shows elevated creatinine of 1.6.  Review of Systems: As per HPI otherwise 10 point review of systems negative.    Past Medical History:  Diagnosis Date  . Anxiety   . Benign neoplasm of colon 03/27/2014  . Brain aneurysm 2006  . Cerebrovascular disease 06/28/2014  . Chronic kidney disease   . Dementia, vascular ([place]) 06/19/2014  . Depression   . Hyperlipidemia 02/20/2019  . Pure hypercholesterolemia 03/27/2014  . Receptive aphasia   . Sleep apnea   . Stroke ([place])   . Vascular dementia ([place])   . Wears dentures     Past Surgical History:  Procedure Laterality Date  . CATARACT EXTRACTION W/PHACO Left 04/27/2018   Procedure: CATARACT EXTRACTION PHACO AND INTRAOCULAR LENS PLACEMENT ([place]) LEFT;  Surgeon: Leandrew Koyanagi, MD;  Location: [facility];  Service: Ophthalmology;  Laterality: Left;  . CATARACT EXTRACTION W/PHACO Right 06/14/2018   Procedure: CATARACT EXTRACTION PHACO AND INTRAOCULAR LENS PLACEMENT ([place])  RIGHT;  Surgeon: Leandrew Koyanagi, MD;  Location: [place];  Service: Ophthalmology;  Laterality: Right;  . CEREBRAL  ANEURYSM REPAIR       reports that she has quit smoking. Her smoking use included cigarettes. She smoked 1.00 pack per day. She has never used smokeless tobacco. She reports previous alcohol use. She reports that she does not use drugs.  No Known Allergies  Family History  Problem Relation Age of Onset  . Dementia Mother   . Heart attack Brother      Prior to Admission medications   Medication Sig Start Date End Date Taking? Authorizing Provider  atorvastatin (LIPITOR) 40 MG tablet Take 1 tablet (40 mg total) by mouth daily. 06/15/20   Ezekiel Slocumb, DO  citalopram (CELEXA) 20 MG tablet Take 20 mg by mouth daily.    [provider]  clopidogrel (PLAVIX) 75 MG tablet Take 1 tablet (75 mg total) by mouth daily. 06/16/20   Ezekiel Slocumb, DO  losartan (COZAAR) 50 MG tablet Take 50 mg by mouth daily.     [provider]  mirabegron ER (MYRBETRIQ) 50 MG TB24 tablet Take 1 tablet (50 mg total) by mouth daily. 04/30/20   Billey Co, MD  multivitamin-lutein [facility]) CAPS capsule Take 1 capsule by mouth daily.    [provider]  Vitamin D, Ergocalciferol, (DRISDOL) 50000 units CAPS capsule Take 50,000 Units by mouth once a week.  03/15/18   [provider]    Physical Exam: Vitals:   06/27/20 1905 06/27/20 1911 06/27/20 1912 06/27/20 1915  BP: (!) 138/92  (!) 184/70 (!) 169/72  Pulse: (!) 58 63 61 (!)  58  Resp: 14 19 20 12   Temp:      TempSrc:      SpO2: 96% 94% 93% 95%  Weight:      Height:        Constitutional: NAD, calm, comfortable Vitals:   06/27/20 1905 06/27/20 1911 06/27/20 1912 06/27/20 1915  BP: (!) 138/92  (!) 184/70 (!) 169/72  Pulse: (!) 58 63 61 (!) 58  Resp: 14 19 20 12   Temp:      TempSrc:      SpO2: 96% 94% 93% 95%  Weight:      Height:       Eyes: PERRL, lids and conjunctivae normal ENMT: Mucous membranes are moist. Posterior pharynx clear of any exudate or lesions.Normal dentition.  Neck: normal, supple,  no masses, no thyromegaly Respiratory: clear to auscultation bilaterally, no wheezing, no crackles. Normal respiratory effort. No accessory muscle use.  Cardiovascular: Regular rate and rhythm, no murmurs / rubs / gallops. No extremity edema. 2+ pedal pulses. No carotid bruits.  Abdomen: no tenderness, no masses palpated. No hepatosplenomegaly. Bowel sounds positive.  Musculoskeletal: no clubbing / cyanosis. No joint deformity upper and lower extremities. Good ROM, no contractures. Normal muscle tone.  Skin: no rashes, lesions, ulcers. No induration Neurologic: CN 2-12 grossly intact. Tongue midline, Strength 5/5 in all 4.  Psychiatric: Normal judgment and insight. Alert and oriented x 3. Normal mood.    Labs on Admission: I have personally reviewed following labs and imaging studies  CBC: Recent Labs  Lab 06/27/20 1547  WBC 8.0  NEUTROABS 4.2  HGB 11.1*  HCT 33.7*  MCV 94.9  PLT 309   Basic Metabolic Panel: Recent Labs  Lab 06/27/20 1547  NA 139  K 3.6  CL 106  CO2 25  GLUCOSE 99  BUN 26*  CREATININE 1.64*  CALCIUM 9.0   GFR: Estimated Creatinine Clearance: 27.7 mL/min (A) (by C-G formula based on SCr of 1.64 mg/dL (H)). Liver Function Tests: Recent Labs  Lab 06/27/20 1547  AST 14*  ALT 9  ALKPHOS 25*  BILITOT 0.8  PROT 7.7  ALBUMIN 3.9   No results for input(s): LIPASE, AMYLASE in the last 168 hours. No results for input(s): AMMONIA in the last 168 hours. Coagulation Profile: Recent Labs  Lab 06/27/20 1547  INR 1.0   Urine analysis:    Component Value Date/Time   COLORURINE YELLOW (A) 06/27/2020 1547   APPEARANCEUR CLOUDY (A) 06/27/2020 1547   APPEARANCEUR Cloudy (A) 04/11/2018 1601   LABSPEC 1.012 06/27/2020 1547   PHURINE 6.0 06/27/2020 1547   GLUCOSEU NEGATIVE 06/27/2020 1547   HGBUR SMALL (A) 06/27/2020 1547   BILIRUBINUR NEGATIVE 06/27/2020 1547   BILIRUBINUR Negative 04/11/2018 1601   KETONESUR NEGATIVE 06/27/2020 1547   PROTEINUR NEGATIVE  06/27/2020 1547   NITRITE NEGATIVE 06/27/2020 1547   LEUKOCYTESUR NEGATIVE 06/27/2020 1547   Radiological Exams on Admission: CT Head Wo Contrast  Result Date: 06/27/2020 CLINICAL DATA:  Hypertension EXAM: CT HEAD WITHOUT CONTRAST TECHNIQUE: Contiguous axial images were obtained from the base of the skull through the vertex without intravenous contrast. COMPARISON:  06/14/2020 FINDINGS: Brain: There is atrophy and chronic small vessel disease changes. Old left parietal and temporal infarcts. No acute infarct, hemorrhage or hydrocephalus. Vascular:  left MCA aneurysm coils noted.  No hyperdense vessel. Skull: No acute calvarial abnormality. Sinuses/Orbits: Visualized paranasal sinuses and mastoids clear. Orbital soft tissues unremarkable. Other: None IMPRESSION: Prior left MCA aneurysm coiling. Old infarcts and encephalomalacia within the left cerebral  hemisphere. Atrophy, chronic microvascular disease. No acute intracranial abnormality. Electronically Signed   By: Rolm Baptise M.D.   On: 06/27/2020 16:21    EKG: Independently reviewed.  Assessment/Plan Principal Problem:   Hypertensive urgency Active Problems:   Stroke ([place])   CKD (chronic kidney disease), stage IIIa   Chronic systolic CHF (congestive heart failure) (H[facility])   Tobacco abuse   Hypertensive urgency: -Pt continued on Cardene Drip and monitor in telemetry unit.  -Cardiac diet.  -If this is due to missing meds or diet indiscretions.  -Consider diuretic therapy for ISH.   Recent cva: -cont Plavix.  -cont statin.  CKD: -hold arb and cont ivf.  -Avoid contrast and renally dose meds.   C/H Diastolic CHF- -Stable. -monitor on telemetry as pt is at risk for heart Failure.   Balance Issues: -initial head ct is negative. -we will obtain MRI for any new ischemic area. -monitor aneurysm.   DVT prophylaxis: heparin Code Status: full Family Communication: Spouse / not at bedside  Disposition Plan: Home with home PT/OT  vs STR per PT eval. Consults called: PT  Admission status: Inpatient.  Para Skeans MD [facility] If 7PM-7AM, please contact night-coverage [email or web address] Password [facility]  06/27/2020, 8:19 PM

## 2020-06-27 NOTE — ED Provider Notes (Signed)
[facility] Emergency Department Provider Note ____________________________________________   First MD Initiated Contact with Patient 06/27/20 1527     (approximate)  I have reviewed the triage vital signs and the nursing notes.   HISTORY  Chief Complaint Referral and Hypertension  Level 5 caveat: History of present illness limited due to aphasia  HPI Linda [name] is a 74 y.o. female with PMH as noted below including prior strokes and an aneurysm who presents from the outpatient neurology clinic with hypertension and possible concern for new stroke symptoms.  The patient states that they told her she had a high blood pressure and possible recent stroke.  She denies any change in her speech, vision, any new weakness or numbness, or other acute symptoms.   Past Medical History:  Diagnosis Date   Anxiety    Benign neoplasm of colon 03/27/2014   Brain aneurysm 2006   Cerebrovascular disease 06/28/2014   Chronic kidney disease    Dementia, vascular ([place]) 06/19/2014   Depression    Hyperlipidemia 02/20/2019   Pure hypercholesterolemia 03/27/2014   Receptive aphasia    Sleep apnea    Stroke [facility])    Vascular dementia ([place])    Wears dentures     Patient Active Problem List   Diagnosis Date Noted   Aphasia 06/14/2020   Brain mass 06/14/2020   Stroke ([place])    CKD (chronic kidney disease), stage IIIa    Chronic systolic CHF (congestive heart failure) (HCC)    Tobacco abuse    Carotid stenosis 02/26/2019   Essential hypertension 02/26/2019   PAD (peripheral artery disease) ([place]) 02/26/2019   Depression 02/20/2019   Hyperlipidemia 02/20/2019   Receptive aphasia 02/20/2019   Cerebrovascular disease 06/28/2014   Dementia, vascular ([place]) 06/19/2014   Benign neoplasm of colon 03/27/2014   Pure hypercholesterolemia 03/27/2014    Past Surgical History:  Procedure Laterality Date   CATARACT EXTRACTION W/PHACO Left 04/27/2018    Procedure: CATARACT EXTRACTION PHACO AND INTRAOCULAR LENS PLACEMENT ([place]) LEFT;  Surgeon: Leandrew Koyanagi, MD;  Location: [place];  Service: Ophthalmology;  Laterality: Left;   CATARACT EXTRACTION W/PHACO Right 06/14/2018   Procedure: CATARACT EXTRACTION PHACO AND INTRAOCULAR LENS PLACEMENT ([place])  RIGHT;  Surgeon: Leandrew Koyanagi, MD;  Location: [place];  Service: Ophthalmology;  Laterality: Right;   CEREBRAL ANEURYSM REPAIR      Prior to Admission medications   Medication Sig Start Date End Date Taking? Authorizing Provider  atorvastatin (LIPITOR) 40 MG tablet Take 1 tablet (40 mg total) by mouth daily. 06/15/20   Ezekiel Slocumb, DO  citalopram (CELEXA) 20 MG tablet Take 20 mg by mouth daily.    [provider]  clopidogrel (PLAVIX) 75 MG tablet Take 1 tablet (75 mg total) by mouth daily. 06/16/20   Ezekiel Slocumb, DO  losartan (COZAAR) 50 MG tablet Take 50 mg by mouth daily.     [provider]  mirabegron ER (MYRBETRIQ) 50 MG TB24 tablet Take 1 tablet (50 mg total) by mouth daily. 04/30/20   Billey Co, MD  multivitamin-lutein [facility]) CAPS capsule Take 1 capsule by mouth daily.    [provider]  Vitamin D, Ergocalciferol, (DRISDOL) 50000 units CAPS capsule Take 50,000 Units by mouth once a week.  03/15/18   [provider]    Allergies Patient has no known allergies.  Family History  Problem Relation Age of Onset   Dementia Mother    Heart attack Brother  Social History Social History   Tobacco Use   Smoking status: Former Smoker    Packs/day: 1.00    Types: Cigarettes   Smokeless tobacco: Never Used  Scientific laboratory technician Use: Never used  Substance Use Topics   Alcohol use: Not Currently   Drug use: Never    Review of Systems Level 5 caveat: Unable to obtain review of systems due to aphasia    ____________________________________________   PHYSICAL EXAM:  VITAL  SIGNS: ED Triage Vitals  Enc Vitals Group     BP 06/27/20 1539 (!) 235/91     Pulse Rate 06/27/20 1539 63     Resp 06/27/20 1539 16     Temp 06/27/20 1539 98.9 F (37.2 C)     Temp Source 06/27/20 1539 Oral     SpO2 06/27/20 1539 96 %     Weight 06/27/20 1542 140 lb (63.5 kg)     Height 06/27/20 1542 5\' 6"  (1.676 m)     Head Circumference --      Peak Flow --      Pain Score 06/27/20 1542 0     Pain Loc --      Pain Edu? --      Excl. in [place]? --     Constitutional: Alert and oriented.  Relatively well appearing and in no acute distress. Eyes: Conjunctivae are normal.  EOMI.  PERRLA. Head: Atraumatic. Nose: No congestion/rhinnorhea. Mouth/Throat: Mucous membranes are moist.   Neck: Normal range of motion.  Cardiovascular: Normal rate, regular rhythm. Grossly normal heart sounds.  Good peripheral circulation. Respiratory: Normal respiratory effort.  No retractions. Lungs CTAB. Gastrointestinal: No distention.  Musculoskeletal: No lower extremity edema.  Extremities warm and well perfused.  Neurologic: Dysarthria and expressive aphasia.  5/5 motor strength and sensation intact all extremities.  No ataxia on finger-to-nose.  No pronator drift.  No facial droop. Skin:  Skin is warm and dry. No rash noted. Psychiatric: Mood and affect are normal. Speech and behavior are normal.  ____________________________________________   LABS (all labs ordered are listed, but only abnormal results are displayed)  Labs Reviewed  COMPREHENSIVE METABOLIC PANEL - Abnormal; Notable for the following components:      Result Value   BUN 26 (*)    Creatinine, Ser 1.64 (*)    AST 14 (*)    Alkaline Phosphatase 25 (*)    GFR calc non Af Amer 30 (*)    GFR calc Af Amer 35 (*)    All other components within normal limits  CBC WITH DIFFERENTIAL/PLATELET - Abnormal; Notable for the following components:   RBC 3.55 (*)    Hemoglobin 11.1 (*)    HCT 33.7 (*)    All other components within normal  limits  URINALYSIS, COMPLETE (UACMP) WITH MICROSCOPIC - Abnormal; Notable for the following components:   Color, Urine YELLOW (*)    APPearance CLOUDY (*)    Hgb urine dipstick SMALL (*)    Bacteria, UA FEW (*)    All other components within normal limits  SARS CORONAVIRUS 2 BY RT PCR (HOSPITAL ORDER, [facility])  PROTIME-INR  TROPONIN I (HIGH SENSITIVITY)  TROPONIN I (HIGH SENSITIVITY)   ____________________________________________  EKG  ED ECG REPORT I, Arta Silence, the attending physician, personally viewed and interpreted this ECG.  Date: 06/27/2020 EKG Time: 1552 Rate: 59 Rhythm: normal sinus rhythm QRS Axis: Right axis Intervals: normal ST/T Wave abnormalities: Nonspecific T wave abnormalities laterally Narrative Interpretation: Nonspecific  abnormalities with no evidence of acute ischemia  ____________________________________________  RADIOLOGY  CT head: Prior aneurysm status post coiling and old infarcts, with no acute findings.  ____________________________________________   PROCEDURES  Procedure(s) performed: No  Procedures  Critical Care performed: Yes  CRITICAL CARE Performed by: Arta Silence   Total critical care time: 40 minutes  Critical care time was exclusive of separately billable procedures and treating other patients.  Critical care was necessary to treat or prevent imminent or life-threatening deterioration.  Critical care was time spent personally by me on the following activities: development of treatment plan with patient and/or surrogate as well as nursing, discussions with consultants, evaluation of patient's response to treatment, examination of patient, obtaining history from patient or surrogate, ordering and performing treatments and interventions, ordering and review of laboratory studies, ordering and review of radiographic studies, pulse oximetry and re-evaluation of patient's  condition. ____________________________________________   INITIAL IMPRESSION / ASSESSMENT AND PLAN / ED COURSE  Pertinent labs & imaging results that were available during my care of the patient were reviewed by me and considered in my medical decision making (see chart for details).  74 year old female with PMH as noted above including prior strokes and an aneurysm presents with hypertension and concern for possible new strokelike symptoms although this is somewhat unclear.  The patient herself is not able to give much history.  She denies any new symptoms.  I reviewed the past medical records in [place].  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  MRI done the same day showed an acute infarct in the left middle frontal gyrus.   Her symptoms improved and she was discharged the following day.  Today she was seen outpatient neurology, and the note reads as follows: "Patient with new stroke like symptoms today, not her recent baseline. Blood pressure elevated 202/98. EMS call and patient was transferred to ER. Patients family given office number to call and make follow up once discharged. Will go ahead and send referral to neurosurgery as well after reviewing recent imaging."  However, I called the outpatient neurology office and spoke to the APP who sent the patient, and she clarified that although the patient seemed somewhat unsteady on her feet there were no specific new strokelike symptoms, but there was concern given her blood pressure that she could be developing an acute stroke.  On exam currently, the patient has aphasia and dysarthria which appears to be her recent baseline but no other acute neurologic symptoms.  Her blood pressure is elevated with otherwise normal vital signs.  The physical exam is otherwise  unremarkable.  Differential includes hypertensive urgency, hypertensive emergency with acute stroke (although the patient does not appear to have significant new neurologic symptoms).  I also do not suspect acute intracranial hemorrhage given that the patient has no headache.  We will obtain a CT, lab work-up, give IV labetalol for blood pressure control, and reassess.  ----------------------------------------- 8:13 PM on 06/27/2020 -----------------------------------------  The patient had no improvement in her blood pressure after IV labetalol, and since her heart rate was already in the 50s I wanted to avoid additional medication that could cause bradycardia.  We started a Cardene drip, and the patient's blood pressure immediately improved.  CT shows no acute abnormalities.  Lab work-up  is otherwise reassuring.  Given the persistent blood pressure requiring an infusion, I admitted the patient.  I discussed her case with Dr. Posey Pronto from the hospitalist service.  ____________________________________________   FINAL CLINICAL IMPRESSION(S) / ED DIAGNOSES  Final diagnoses:  Hypertensive urgency      NEW MEDICATIONS STARTED DURING THIS VISIT:  New Prescriptions   No medications on file     Note:  This document was prepared using Dragon voice recognition software and may include unintentional dictation errors.    Arta Silence, MD 06/27/20 2013

## 2020-06-27 NOTE — ED Triage Notes (Signed)
Pt arrived by ems from [facility] clinic in [place] for changes in ct scan and htn per ems. Ems reports no stroke like symptoms. sent by pcp. Patient alert to self and place only. Patient does not understand many questions asked. Hx aphasia and stroke. Hx dementia. No facial droop. No weakness or numbness. Pt able to follow commands

## 2020-06-28 DIAGNOSIS — I160 Hypertensive urgency: Secondary | ICD-10-CM | POA: Diagnosis not present

## 2020-06-28 LAB — HEMOGLOBIN A1C
Hgb A1c MFr Bld: 5.5 % (ref 4.8–5.6)
Mean Plasma Glucose: 111.15 mg/dL

## 2020-06-28 LAB — LIPID PANEL
Cholesterol: 138 mg/dL (ref 0–200)
HDL: 53 mg/dL (ref >40)
LDL Cholesterol: 73 mg/dL (ref 0–99)
Total CHOL/HDL Ratio: 2.6 RATIO
Triglycerides: 58 mg/dL (ref <150)
VLDL: 12 mg/dL (ref 0–40)

## 2020-06-28 LAB — MRSA PCR SCREENING: MRSA by PCR: NEGATIVE

## 2020-06-28 LAB — GLUCOSE, CAPILLARY: Glucose-Capillary: 102 mg/dL — ABNORMAL HIGH (ref 70–99)

## 2020-06-28 MED ORDER — CHLORHEXIDINE GLUCONATE CLOTH 2 % EX PADS
6.0000 | MEDICATED_PAD | Freq: Every day | CUTANEOUS | Status: DC
  Filled 2020-06-28: qty 6

## 2020-06-28 MED ORDER — SODIUM CHLORIDE 0.9 % IV SOLN: INTRAVENOUS | Status: DC

## 2020-06-28 MED ORDER — LOSARTAN POTASSIUM 50 MG PO TABS
100.0000 mg | ORAL_TABLET | Freq: Every day | ORAL | Status: DC
  Administered 2020-06-28: 100 mg via ORAL
  Filled 2020-06-28: qty 2

## 2020-06-28 MED ORDER — HYDRALAZINE HCL 20 MG/ML IJ SOLN
10.0000 mg | Freq: Once | INTRAMUSCULAR | Status: AC
  Administered 2020-06-28: 10 mg via INTRAVENOUS
  Filled 2020-06-28: qty 1

## 2020-06-28 NOTE — Discharge Summary (Signed)
Physician Discharge Summary  Linda [name] LTJ:[id number] DOB: [birth year] DOA: 06/27/2020  PCP: Sofie Hartigan, MD  Admit date: 06/27/2020 Discharge date: 06/28/2020  Admitted From: Home Disposition:  Home  Recommendations for Outpatient Follow-up:  1. Follow up with PCP in 1-2 weeks 2. Please obtain BMP/CBC in one week 3. Please follow up on the following pending results:None  Home Health:Yes Equipment/Devices:None Discharge Condition: Stable CODE STATUS: Full Diet recommendation: Heart Healthy   Brief/Interim Summary:  Linda [name] is a 74 y.o. 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 Initial arrival BP was 076 systolic. Pt is alert,awake and oriented and answers questions appropriately and follows command. Repeat MRI was negative for any acute infarcts. We ordered some home health services which include speech therapy according to the recommendations.  She was initially treated with Cardene infusion-pressure responded well and then she transition to her home dose of losartan.  She was advised to keep a log of her blood pressure at home and take it to her primary care physician early next week for adjustment in her medications.  Patient has CKD with currently stable creatinine.  Baseline 1.58, it was 1.6 on discharge.  She will follow-up with her nephrologist for further management.  Patient will continue with rest of her home meds.  Discharge Diagnoses:  Principal Problem:   Hypertensive urgency Active Problems:   Stroke ([place])   CKD (chronic kidney disease), stage IIIa   Chronic systolic CHF (congestive heart failure) (HCC)   Tobacco abuse   Asymptomatic hypertensive urgency   Discharge Instructions  Discharge Instructions    Diet - low sodium heart healthy   Complete by: As directed     Discharge instructions   Complete by: As directed    It was pleasure taking care of you. Please continue taking your home medications and check your blood pressure while you are relaxed and resting and make a log.  Take that log to your primary care physician earlier next week to adjust your blood pressure medications.   Increase activity slowly   Complete by: As directed      Allergies as of 06/28/2020   No Known Allergies     Medication List    TAKE these medications   atorvastatin 40 MG tablet Commonly known as: LIPITOR Take 1 tablet (40 mg total) by mouth daily.   citalopram 20 MG tablet Commonly known as: CELEXA Take 20 mg by mouth daily.   clopidogrel 75 MG tablet Commonly known as: PLAVIX Take 1 tablet (75 mg total) by mouth daily.   losartan 100 MG tablet Commonly known as: COZAAR Take 100 mg by mouth daily.   mirabegron ER 50 MG Tb24 tablet Commonly known as: MYRBETRIQ Take 1 tablet (50 mg total) by mouth daily.   multivitamin-lutein Caps capsule Take 1 capsule by mouth daily.   Vitamin D (Ergocalciferol) 1.25 MG (50000 UNIT) Caps capsule Commonly known as: DRISDOL Take 50,000 Units by mouth once a week.       Follow-up Information    Feldpausch, Chrissie Noa, MD. Schedule an appointment as soon as possible for a visit.   Specialty: Family Medicine Why: To be seen earlier next week for blood pressure check and adjustment of medications Contact information: [place] [id number] [phone]  No Known Allergies  Consultations:  None  Procedures/Studies: CT Angio Head W or Wo Contrast  Result Date: 06/14/2020 CLINICAL DATA:  Stroke. Facial droop and aphasia. History of aneurysm treatment EXAM: CT ANGIOGRAPHY HEAD AND NECK TECHNIQUE: Multidetector CT imaging of the head and neck was performed using the standard protocol during bolus administration of intravenous contrast. Multiplanar CT image reconstructions and MIPs were  obtained to evaluate the vascular anatomy. Carotid stenosis measurements (when applicable) are obtained utilizing NASCET criteria, using the distal internal carotid diameter as the denominator. CONTRAST:  32mL OMNIPAQUE IOHEXOL 350 MG/ML SOLN COMPARISON:  CT head 03/17/2012 FINDINGS: CT HEAD FINDINGS Brain: Progressive atrophy. Extensive chronic microvascular ischemic change throughout the white matter with progression. Aneurysm coiling left MCA bifurcation region with streak artifact. Chronic encephalomalacia left temporal lobe with volume loss and enlargement of the left temporal horn similar to the prior study. Negative for acute infarct or hemorrhage. Suprasellar mass lesion measuring 5 x 6 mm. This shows homogeneous enhancement. The lesion is best seen on sagittal images. Vascular: Negative for hyperdense vessel Skull: Negative Sinuses: Visualized paranasal sinuses clear. Orbits: Bilateral cataract extraction Review of the MIP images confirms the above findings CTA NECK FINDINGS Aortic arch: Atherosclerotic calcification aortic arch. Left vertebral artery origin from the arch. Atherosclerotic calcification and mild stenosis proximal left subclavian artery. Mild stenosis at the origin of the left common carotid artery. Right carotid system: Mild atherosclerotic disease right carotid bifurcation without significant stenosis. Left carotid system: Atherosclerotic disease left carotid bifurcation. Left internal carotid artery narrowed to 2.4 mm in minimal diameter corresponding to 47% stenosis. Vertebral arteries: Left vertebral artery dominant and patent without significant stenosis. Small right vertebral artery ends in PICA. Skeleton: Cervical degenerative changes with prominent facet degeneration bilaterally right greater than left. No acute skeletal abnormality. Other neck: Negative for mass or adenopathy in the neck. Upper chest: Apical emphysema and subpleural blebs. Apical scarring bilaterally. No acute  abnormality. Atherosclerotic aortic arch. Review of the MIP images confirms the above findings CTA HEAD FINDINGS Anterior circulation: Mild atherosclerotic disease in the cavernous carotid bilaterally without stenosis. Anterior and middle cerebral arteries patent bilaterally without stenosis. Aneurysm coiling left MCA bifurcation. There is streak artifact from the coil however no aneurysm is identified filling with contrast. Posterior circulation: Left vertebral artery supplies the basilar. Left PICA patent. Right vertebral artery ends in PICA. Basilar patent. AICA, superior cerebellar arteries patent bilaterally. Fetal origin of the posterior cerebral artery bilaterally without stenosis or occlusion. No aneurysm in the posterior circulation. Venous sinuses: Normal venous enhancement Anatomic variants: None Review of the MIP images confirms the above findings IMPRESSION: 1. Negative for acute infarct or hemorrhage 2. Progression of atrophy and chronic ischemic change since 2013. 3. 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 No other aneurysm is identified 4. Negative for intracranial large vessel occlusion 5. Mild atherosclerotic disease the carotid bifurcation bilaterally. No significant right carotid stenosis. 47% diameter stenosis proximal left internal carotid artery. No significant vertebral artery stenosis. Right vertebral artery ends in PICA. 6. 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 Recommend MRI brain without and with contrast. Electronically Signed   By: Franchot Gallo M.D.   On: 06/14/2020 13:07   CT Head Wo Contrast  Result Date: 06/27/2020 CLINICAL DATA:  Hypertension EXAM: CT HEAD WITHOUT CONTRAST TECHNIQUE: Contiguous axial images were obtained from the base of the skull through the vertex without intravenous contrast. COMPARISON:  06/14/2020 FINDINGS:  Brain: There is atrophy and chronic small vessel disease changes.  Old left parietal and temporal infarcts. No acute infarct, hemorrhage or hydrocephalus. Vascular:  left MCA aneurysm coils noted.  No hyperdense vessel. Skull: No acute calvarial abnormality. Sinuses/Orbits: Visualized paranasal sinuses and mastoids clear. Orbital soft tissues unremarkable. Other: None IMPRESSION: Prior left MCA aneurysm coiling. Old infarcts and encephalomalacia within the left cerebral hemisphere. Atrophy, chronic microvascular disease. No acute intracranial abnormality. Electronically Signed   By: Rolm Baptise M.D.   On: 06/27/2020 16:21   CT Angio Neck W and/or Wo Contrast  Result Date: 06/14/2020 CLINICAL DATA:  Stroke. Facial droop and aphasia. History of aneurysm treatment EXAM: CT ANGIOGRAPHY HEAD AND NECK TECHNIQUE: Multidetector CT imaging of the head and neck was performed using the standard protocol during bolus administration of intravenous contrast. Multiplanar CT image reconstructions and MIPs were obtained to evaluate the vascular anatomy. Carotid stenosis measurements (when applicable) are obtained utilizing NASCET criteria, using the distal internal carotid diameter as the denominator. CONTRAST:  60mL OMNIPAQUE IOHEXOL 350 MG/ML SOLN COMPARISON:  CT head 03/17/2012 FINDINGS: CT HEAD FINDINGS Brain: Progressive atrophy. Extensive chronic microvascular ischemic change throughout the white matter with progression. Aneurysm coiling left MCA bifurcation region with streak artifact. Chronic encephalomalacia left temporal lobe with volume loss and enlargement of the left temporal horn similar to the prior study. Negative for acute infarct or hemorrhage. Suprasellar mass lesion measuring 5 x 6 mm. This shows homogeneous enhancement. The lesion is best seen on sagittal images. Vascular: Negative for hyperdense vessel Skull: Negative Sinuses: Visualized paranasal sinuses clear. Orbits: Bilateral cataract extraction Review of the MIP images confirms the above findings CTA NECK FINDINGS  Aortic arch: Atherosclerotic calcification aortic arch. Left vertebral artery origin from the arch. Atherosclerotic calcification and mild stenosis proximal left subclavian artery. Mild stenosis at the origin of the left common carotid artery. Right carotid system: Mild atherosclerotic disease right carotid bifurcation without significant stenosis. Left carotid system: Atherosclerotic disease left carotid bifurcation. Left internal carotid artery narrowed to 2.4 mm in minimal diameter corresponding to 47% stenosis. Vertebral arteries: Left vertebral artery dominant and patent without significant stenosis. Small right vertebral artery ends in PICA. Skeleton: Cervical degenerative changes with prominent facet degeneration bilaterally right greater than left. No acute skeletal abnormality. Other neck: Negative for mass or adenopathy in the neck. Upper chest: Apical emphysema and subpleural blebs. Apical scarring bilaterally. No acute abnormality. Atherosclerotic aortic arch. Review of the MIP images confirms the above findings CTA HEAD FINDINGS Anterior circulation: Mild atherosclerotic disease in the cavernous carotid bilaterally without stenosis. Anterior and middle cerebral arteries patent bilaterally without stenosis. Aneurysm coiling left MCA bifurcation. There is streak artifact from the coil however no aneurysm is identified filling with contrast. Posterior circulation: Left vertebral artery supplies the basilar. Left PICA patent. Right vertebral artery ends in PICA. Basilar patent. AICA, superior cerebellar arteries patent bilaterally. Fetal origin of the posterior cerebral artery bilaterally without stenosis or occlusion. No aneurysm in the posterior circulation. Venous sinuses: Normal venous enhancement Anatomic variants: None Review of the MIP images confirms the above findings IMPRESSION: 1. Negative for acute infarct or hemorrhage 2. Progression of atrophy and chronic ischemic change since 2013. 3. Left  MCA bifurcation aneurysm coiling without recurrent aneurysm. No other aneurysm is identified 4. Negative for intracranial large vessel occlusion 5. Mild atherosclerotic disease the carotid bifurcation bilaterally. No significant right carotid stenosis. 47% diameter stenosis proximal left internal carotid artery. No significant vertebral artery stenosis. Right vertebral artery ends  in PICA. 6. 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 Recommend MRI brain without and with contrast. Electronically Signed   By: Franchot Gallo M.D.   On: 06/14/2020 13:07   MR BRAIN WO CONTRAST  Result Date: 06/27/2020 CLINICAL DATA:  Initial evaluation for hypertension with possible new stroke symptoms. History of recent stroke. EXAM: MRI HEAD WITHOUT CONTRAST TECHNIQUE: Multiplanar, multiecho pulse sequences of the brain and surrounding structures were obtained without intravenous contrast. COMPARISON:  Prior CT from earlier the same day as well as recent brain MRI and CTA from 06/14/2020 FINDINGS: Brain: Advanced cerebral atrophy with chronic microvascular ischemic disease again seen. Extensive encephalomalacia of and gliosis involving the left temporoparietal region with associated chronic hemosiderin staining, suggesting prior hemorrhagic infarct. Multiple scattered remote lacunar infarcts noted involving the bilateral basal ganglia, thalami, pons, and cerebellum. There has been normal expected interval evolution of recently identified left MCA territory infarct involving the left middle frontal gyrus. Mild residual diffusion abnormality seen within this region. No evidence for hemorrhagic transformation, mass effect, or other complication. No other foci of restricted diffusion to suggest acute or subacute ischemia. Gray-white matter differentiation otherwise maintained and stable. No evidence for acute intracranial hemorrhage. 6 mm suprasellar mass again noted, stable from  recent brain MRI. No other mass lesion, mass effect, or midline shift. Diffuse ventricular prominence related to global parenchymal atrophy with associated ex vacuo dilatation related to the chronic left cerebral encephalomalacia. No hydrocephalus. No extra-axial fluid collection. Vascular: Major intracranial vascular flow voids are maintained. Sequelae of prior coil embolization of left MCA bifurcation aneurysm. Skull and upper cervical spine: Craniocervical junction within normal limits. Bone marrow signal intensity somewhat heterogeneous without focal marrow replacing lesion. No scalp soft tissue abnormality. Sinuses/Orbits: Patient status post bilateral ocular lens replacement. Globes and orbital soft tissues demonstrate no acute finding. Paranasal sinuses are largely clear. Small right mastoid effusion, stable from previous, likely chronic and of doubtful significance. Other: None. IMPRESSION: 1. Normal expected evolution of recently identified left MCA territory infarct. No evidence for hemorrhagic transformation or other complication. 2. No other new infarct or other acute intracranial abnormality. 3. Advanced cerebral atrophy with chronic microvascular ischemic disease with multiple remote lacunar infarcts as above. Chronic hemorrhagic left temporoparietal infarct. 4. 6 mm suprasellar mass, indeterminate, but stable from recent brain MRI. Electronically Signed   By: Jeannine Boga M.D.   On: 06/27/2020 23:28   MR Brain W and Wo Contrast  Result Date: 06/14/2020 CLINICAL DATA:  Speech difficulty.  History of aneurysm repair EXAM: MRI HEAD WITHOUT AND WITH CONTRAST TECHNIQUE: Multiplanar, multiecho pulse sequences of the brain and surrounding structures were obtained without and with intravenous contrast. CONTRAST:  80mL GADAVIST GADOBUTROL 1 MMOL/ML IV SOLN COMPARISON:  CT angio head and neck today FINDINGS: Brain: Acute infarct left mid frontal gyrus. No associated hemorrhage. No other acute infarct.  Chronic hemorrhagic infarct left temporal lobe. Prior coiling of left MCA aneurysm. Moderate atrophy. Extensive chronic microvascular ischemic change in the white matter and pons. Small chronic infarcts in the right cerebellum. Chronic infarcts in the basal ganglia bilaterally and thalamus bilaterally. Small enhancing suprasellar mass measuring approximately 5 x 6 mm as noted on CT. This appears separate from the pituitary and is in the midline near the infundibulum. Anatomic detail is limited due to motion and lack of pituitary protocol. The pituitary is normal in size Vascular: Normal arterial flow voids. Coiling of left MCA bifurcation aneurysm.  Skull and upper cervical spine: No focal skeletal lesion. Sinuses/Orbits: Mild mucosal edema paranasal sinuses. Bilateral cataract extraction Other: None IMPRESSION: Acute infarct in the left middle frontal gyrus. Atrophy and extensive chronic ischemic change. Chronic hemorrhagic infarct left temporal lobe. 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 Given the enhancing nature, Rathke's cleft cyst not considered likely. Possibilities include sarcoidosis, lymphoma, metastatic disease, craniopharyngioma, and other inflammatory processes of the infundibulum. Electronically Signed   By: Franchot Gallo M.D.   On: 06/14/2020 16:17     Subjective: Patient was feeling better when seen today.  Husband was in the room.  She wants to go home.  Denies any chest pain or shortness of breath.  Denies any dizziness or headache.  Discharge Exam: Vitals:   06/28/20 1230 06/28/20 1300  BP: (!) 175/73 (!) 173/73  Pulse: 73 80  Resp: (!) 22 (!) 21  Temp:    SpO2: 90% (!) 89%   Vitals:   06/28/20 1130 06/28/20 1200 06/28/20 1230 06/28/20 1300  BP: (!) 178/79 (!) 167/72 (!) 175/73 (!) 173/73  Pulse: 65 69 73 80  Resp: 17 17 (!) 22 (!) 21  Temp:      TempSrc:      SpO2: 93% 95% 90% (!) 89%  Weight:      Height:        General: Pt is alert, awake, not in acute  distress, mild dysarthria. Cardiovascular: RRR, S1/S2 +, no rubs, no gallops Respiratory: CTA bilaterally, no wheezing, no rhonchi Abdominal: Soft, NT, ND, bowel sounds + Extremities: no edema, no cyanosis   The results of significant diagnostics from this hospitalization (including imaging, microbiology, ancillary and laboratory) are listed below for reference.    Microbiology: Recent Results (from the past 240 hour(s))  SARS Coronavirus 2 by RT PCR (hospital order, performed in [facility] hospital lab) Nasopharyngeal Nasopharyngeal Swab     Status: None   Collection Time: 06/27/20  8:37 PM   Specimen: Nasopharyngeal Swab  Result Value Ref Range Status   SARS Coronavirus 2 NEGATIVE NEGATIVE Final    Comment: (NOTE) SARS-CoV-2 target nucleic acids are NOT DETECTED.  The SARS-CoV-2 RNA is generally detectable in upper and lower respiratory specimens during the acute phase of infection. The lowest concentration of SARS-CoV-2 viral copies this assay can detect is 250 copies / mL. A negative result does not preclude SARS-CoV-2 infection and should not be used as the sole basis for treatment or other patient management decisions.  A negative result may occur with improper specimen collection / handling, submission of specimen other than nasopharyngeal swab, presence of viral mutation(s) within the areas targeted by this assay, and inadequate number of viral copies (<250 copies / mL). A negative result must be combined with clinical observations, patient history, and epidemiological information.  Fact Sheet for Patients:   StrictlyIdeas.no  Fact Sheet for Healthcare Providers: BankingDealers.co.za  This test is not yet approved or  cleared by the [place] FDA and has been authorized for detection and/or diagnosis of SARS-CoV-2 by FDA under an Emergency Use Authorization (EUA).  This EUA will remain in effect (meaning this test can  be used) for the duration of the COVID-19 declaration under Section 564(b)(1) of the Act, 21 U.S.C. section 360bbb-3(b)(1), unless the authorization is terminated or revoked sooner.  Performed at [facility], [address] [place], [place] [address]   MRSA PCR Screening     Status: None   Collection Time: 06/28/20 12:46 AM  Specimen: Nasopharyngeal  Result Value Ref Range Status   MRSA by PCR NEGATIVE NEGATIVE Final    Comment:        The GeneXpert MRSA Assay (FDA approved for NASAL specimens only), is one component of a comprehensive MRSA colonization surveillance program. It is not intended to diagnose MRSA infection nor to guide or monitor treatment for MRSA infections. Performed at [facility], [address] [place], [place] [address]      Labs: BNP (last 3 results) Recent Labs    06/14/20 1450  BNP 13.2   Basic Metabolic Panel: Recent Labs  Lab 06/27/20 1547  NA 139  K 3.6  CL 106  CO2 25  GLUCOSE 99  BUN 26*  CREATININE 1.64*  CALCIUM 9.0   Liver Function Tests: Recent Labs  Lab 06/27/20 1547  AST 14*  ALT 9  ALKPHOS 25*  BILITOT 0.8  PROT 7.7  ALBUMIN 3.9   No results for input(s): LIPASE, AMYLASE in the last 168 hours. No results for input(s): AMMONIA in the last 168 hours. CBC: Recent Labs  Lab 06/27/20 1547  WBC 8.0  NEUTROABS 4.2  HGB 11.1*  HCT 33.7*  MCV 94.9  PLT 231   Cardiac Enzymes: No results for input(s): CKTOTAL, CKMB, CKMBINDEX, TROPONINI in the last 168 hours. BNP: Invalid input(s): POCBNP CBG: Recent Labs  Lab 06/28/20 0147  GLUCAP 102*   D-Dimer No results for input(s): DDIMER in the last 72 hours. Hgb A1c Recent Labs    06/28/20 0344  HGBA1C 5.5   Lipid Profile Recent Labs    06/28/20 0344  CHOL 138  HDL 53  LDLCALC 73  TRIG 58  CHOLHDL 2.6   Thyroid function studies No results for input(s): TSH, T4TOTAL, T3FREE, THYROIDAB in the last 72 hours.  Invalid input(s):  FREET3 Anemia work up No results for input(s): VITAMINB12, FOLATE, FERRITIN, TIBC, IRON, RETICCTPCT in the last 72 hours. Urinalysis    Component Value Date/Time   COLORURINE YELLOW (A) 06/27/2020 1547   APPEARANCEUR CLOUDY (A) 06/27/2020 1547   APPEARANCEUR Cloudy (A) 04/11/2018 1601   LABSPEC 1.012 06/27/2020 1547   PHURINE 6.0 06/27/2020 1547   GLUCOSEU NEGATIVE 06/27/2020 1547   HGBUR SMALL (A) 06/27/2020 1547   BILIRUBINUR NEGATIVE 06/27/2020 1547   BILIRUBINUR Negative 04/11/2018 1601   KETONESUR NEGATIVE 06/27/2020 1547   PROTEINUR NEGATIVE 06/27/2020 1547   NITRITE NEGATIVE 06/27/2020 1547   LEUKOCYTESUR NEGATIVE 06/27/2020 1547   Sepsis Labs Invalid input(s): PROCALCITONIN,  WBC,  LACTICIDVEN Microbiology Recent Results (from the past 240 hour(s))  SARS Coronavirus 2 by RT PCR (hospital order, performed in [facility] hospital lab) Nasopharyngeal Nasopharyngeal Swab     Status: None   Collection Time: 06/27/20  8:37 PM   Specimen: Nasopharyngeal Swab  Result Value Ref Range Status   SARS Coronavirus 2 NEGATIVE NEGATIVE Final    Comment: (NOTE) SARS-CoV-2 target nucleic acids are NOT DETECTED.  The SARS-CoV-2 RNA is generally detectable in upper and lower respiratory specimens during the acute phase of infection. The lowest concentration of SARS-CoV-2 viral copies this assay can detect is 250 copies / mL. A negative result does not preclude SARS-CoV-2 infection and should not be used as the sole basis for treatment or other patient management decisions.  A negative result may occur with improper specimen collection / handling, submission of specimen other than nasopharyngeal swab, presence of viral mutation(s) within the areas targeted by this assay, and inadequate number of viral copies (<250 copies /  mL). A negative result must be combined with clinical observations, patient history, and epidemiological information.  Fact Sheet for Patients:    StrictlyIdeas.no  Fact Sheet for Healthcare Providers: BankingDealers.co.za  This test is not yet approved or  cleared by the [place] FDA and has been authorized for detection and/or diagnosis of SARS-CoV-2 by FDA under an Emergency Use Authorization (EUA).  This EUA will remain in effect (meaning this test can be used) for the duration of the COVID-19 declaration under Section 564(b)(1) of the Act, 21 U.S.C. section 360bbb-3(b)(1), unless the authorization is terminated or revoked sooner.  Performed at [facility], [address] [place], [place] [address]   MRSA PCR Screening     Status: None   Collection Time: 06/28/20 12:46 AM   Specimen: Nasopharyngeal  Result Value Ref Range Status   MRSA by PCR NEGATIVE NEGATIVE Final    Comment:        The GeneXpert MRSA Assay (FDA approved for NASAL specimens only), is one component of a comprehensive MRSA colonization surveillance program. It is not intended to diagnose MRSA infection nor to guide or monitor treatment for MRSA infections. Performed at [facility], [address] [place],  [address]     Time coordinating discharge: Over 30 minutes  SIGNED:  Lorella Nimrod, MD  [facility] 06/28/2020, 1:02 PM  If 7PM-7AM, please contact night-coverage [email or web address]  This record has been created using Systems analyst. Errors have been sought and corrected,but may not always be located. Such creation errors do not reflect on the standard of care.

## 2020-06-28 NOTE — Progress Notes (Signed)
PT/OT ambulating patient around unit at this time.

## 2020-06-28 NOTE — Evaluation (Signed)
Occupational Therapy Evaluation Patient Details Name: Linda [name] MRN: [id number] DOB: 10-Feb-1946 Today's Date: 06/28/2020    History of Present Illness Linda [name] is a 74 y.o. 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   Clinical Impression   Linda [name] was seen for OT evaluation this date. Prior to hospital admission, pt was requiring light assist for ADLs from husband/son and using [facility] or 4WW for mobility. Pt lives in home c 2 STE and family available 24/7. Pt presents to acute OT demonstrating impaired ADL performance, functional cognition, and functional mobility 2/2 decreased safety awareness, impulsivity, functional strength/balance deficits, and decreased activity tolerance. Pt currently requires SBA don B socks seated EOB. SETUP self-feeding seated in chair. MIN A + VCs + SPC + 1HHA for ADL t/f - anticipate improved mobility c RW use. Pt would benefit from skilled OT to address noted impairments and functional limitations (see below for any additional details) in order to maximize safety and independence while minimizing falls risk and caregiver burden. Upon hospital discharge, recommend HHOT to maximize pt safety and return to functional independence during meaningful occupations of daily life.    Follow Up Recommendations  Home health OT    Equipment Recommendations  3 in 1 bedside commode    Recommendations for Other Services       Precautions / Restrictions Precautions Precautions: Fall Restrictions Weight Bearing Restrictions: No      Mobility Bed Mobility Overal bed mobility: Needs Assistance Bed Mobility: Sit to Supine;Supine to Sit     Supine to sit: Supervision Sit to supine: Supervision      Transfers Overall transfer level: Needs assistance Equipment used: Straight cane;1 person  hand held assist Transfers: Sit to/from Stand Sit to Stand: Min assist         General transfer comment: MIN A - physcial assist to stand and for balance, VCs to use RW and for safety    Balance Overall balance assessment: Needs assistance Sitting-balance support: Feet unsupported;Single extremity supported Sitting balance-Leahy Scale: Good     Standing balance support: Bilateral upper extremity supported;During functional activity Standing balance-Leahy Scale: Poor                             ADL either performed or assessed with clinical judgement   ADL Overall ADL's : Needs assistance/impaired                                       General ADL Comments: SBA don B socks seated EOB. SETUP self-feeding seated in chair. MIN A + VCs + SPC + 1HHA for ADL t/f - anticipate improved mobility c RW use.      Vision         Perception     Praxis      Pertinent Vitals/Pain Pain Assessment: No/denies pain     Hand Dominance Right   Extremity/Trunk Assessment Upper Extremity Assessment Upper Extremity Assessment: Generalized weakness   Lower Extremity Assessment Lower Extremity Assessment: Generalized weakness       Communication Communication Communication: Expressive difficulties;Receptive difficulties   Cognition Arousal/Alertness: Awake/alert Behavior During Therapy: Anxious;Impulsive (Appears anxious to return home ) Overall Cognitive Status: History of cognitive impairments -  at baseline                                 General Comments: Per husband in room pt is at baseline. Pt requires repetition of requests and reasoning - impulsive and appears anxious to return home    General Comments  SpO2 desat low 90s following chair transfer - resolved c rest     Exercises Exercises: Other exercises Other Exercises Other Exercises: Pt and caregiver educated re: OT role, DME recs, d/c recs, falls prevention, safe t/f  technique Other Exercises: LBD, self-feeding, sup<>sit, sit<>stand x2, simulated toilet t/f, sitting/standing balance/tolerance   Shoulder Instructions      Home Living Family/patient expects to be discharged to:: Private residence Living Arrangements: Spouse/significant other;Children Available Help at Discharge: Family;Available 24 hours/day (son PRN, husband 24/7) Type of Home: House Home Access: Stairs to enter [employer] of Steps: 2 Entrance Stairs-Rails: Can reach both Home Layout: One level     Bathroom Shower/Tub: Occupational psychologist: Standard     Home Equipment: Environmental consultant - 4 wheels;Cane - single point;Grab bars - tub/shower   Additional Comments: 2 cats      Prior Functioning/Environment Level of Independence: Needs assistance  Gait / Transfers Assistance Needed: Rollator and SPC, prefers cane ADL's / Homemaking Assistance Needed: Husband reports she has required MIN assist for ADLs since stroke ~2 weeks prior  Communication / Swallowing Assistance Needed: Aphasia           OT Problem List: Decreased strength;Decreased activity tolerance;Decreased safety awareness;Impaired balance (sitting and/or standing);Decreased knowledge of use of DME or AE      OT Treatment/Interventions: Self-care/ADL training;Therapeutic exercise;Patient/family education;DME and/or AE instruction;Therapeutic activities;Neuromuscular education    OT Goals(Current goals can be found in the care plan section) Acute Rehab OT Goals Patient Stated Goal: To go home today OT Goal Formulation: With patient Time For Goal Achievement: 07/12/20 Potential to Achieve Goals: Good ADL Goals Pt Will Perform Grooming: with modified independence;sitting Pt Will Perform Lower Body Dressing: with modified independence;sit to/from stand (c LRAD PRN) Pt Will Transfer to Toilet: with supervision;ambulating;regular height toilet (c LRAD PRN) Additional ADL Goal #1: Pt will  Independently verbalize plan to implement x3 falls prevention strategies  OT Frequency: Min 1X/week   Barriers to D/C: Inaccessible home environment          Co-evaluation              AM-PAC OT "6 Clicks" Daily Activity     Outcome Measure Help from another person eating meals?: None Help from another person taking care of personal grooming?: A Little Help from another person toileting, which includes using toliet, bedpan, or urinal?: A Little Help from another person bathing (including washing, rinsing, drying)?: A Little Help from another person to put on and taking off regular upper body clothing?: None Help from another person to put on and taking off regular lower body clothing?: A Little 6 Click Score: 20   End of Session Equipment Utilized During Treatment:  [facility]) Nurse Communication: Mobility status  Activity Tolerance: Patient tolerated treatment well Patient left: in chair;with call bell/phone within reach;with family/visitor present;with nursing/sitter in room  OT Visit Diagnosis: Other abnormalities of gait and mobility (R26.89)                Time: 7341-9379 OT Time Calculation (min): 24 min Charges:  OT General Charges $OT Visit: 1 Visit OT  Evaluation $OT Eval Moderate Complexity: 1 Mod OT Treatments $Self Care/Home Management : 8-22 mins  Dessie Coma, M.S. OTR/L  06/28/20, 2:35 PM

## 2020-06-28 NOTE — Evaluation (Signed)
Physical Therapy Evaluation Patient Details Name: Linda [name] MRN: [id number] DOB: 1946-10-30 Today's Date: 06/28/2020   History of Present Illness  Pt is a 74 y.o. female with PMH that includes: recent CVA 06/14/20 with aphasia, HTN, MCA aneurysm s/p coiling, anxiety, CKD, and vascular dementia. Pt diagnosed with hypertensive urgency and balance issues with heat CT and MRI negative    Clinical Impression  Pt pleasant and motivated to participate during the session.  Pt with noted aphasia during the session and presented with difficulty with both verbal communication as well as following commands.  With extra time and cuing pt was able to follow most 1-step commands, however, and overall performed well during the session.  Pt demonstrated fair functional strength during sit to/from stand transfers and was able to amb 150' with a RW with min lean on the walker for support and with slow cadence but with no LOB noted.  Pt's SpO2 and HR were WNL during the session on room air.  Pt will benefit from HHPT services upon discharge to safely address deficits listed in patient problem list for decreased caregiver assistance and eventual return to PLOF.      Follow Up Recommendations Home health PT;Supervision/Assistance - 24 hour;Supervision for mobility/OOB    Equipment Recommendations  Rolling walker with 5" wheels    Recommendations for Other Services       Precautions / Restrictions Precautions Precautions: Fall Restrictions Weight Bearing Restrictions: No      Mobility  Bed Mobility Overal bed mobility: Needs Assistance Bed Mobility: Sit to Supine;Supine to Sit     Supine to sit: Supervision Sit to supine: Supervision   General bed mobility comments: NT, pt in recliner; per recent OT evaluation pt SBA with bed mobiltiy tasks  Transfers Overall transfer level: Needs assistance Equipment used: Rolling walker (2 wheeled) Transfers: Sit to/from Stand Sit to Stand: Min guard          General transfer comment: Per OT evaluation pt generally unsteady with a SPC with transfers requiring min A but was CGA only with a RW with fair eccentric and concentric control  Ambulation/Gait Ambulation/Gait assistance: Min guard Gait Distance (Feet): 150 Feet Assistive device: Rolling walker (2 wheeled) Gait Pattern/deviations: Step-through pattern;Decreased step length - right;Decreased step length - left Gait velocity: decreased   General Gait Details: Slow cadence but steady with amb with a RW including during 180 deg turns  Financial trader Rankin (Stroke Patients Only)       Balance Overall balance assessment: Needs assistance Sitting-balance support: Feet unsupported;Single extremity supported Sitting balance-Leahy Scale: Good     Standing balance support: Bilateral upper extremity supported;During functional activity Standing balance-Leahy Scale: Fair Standing balance comment: Min lean on the RW for support but no LOB noted with static standing or during ambulation                             Pertinent Vitals/Pain Pain Assessment: No/denies pain    Home Living Family/patient expects to be discharged to:: Private residence Living Arrangements: Spouse/significant other;Children Available Help at Discharge: Family;Available 24 hours/day Type of Home: House Home Access: Stairs to enter Entrance Stairs-Rails: Can reach both;Right;Left Entrance Stairs-Number of Steps: 2 Home Layout: One level Home Equipment: Walker - 4 wheels;Cane - single point;Grab bars - tub/shower Additional Comments: 2 cats    Prior Function Level of Independence:  Needs assistance   Gait / Transfers Assistance Needed: Pt ambulates with a rollator or SPC  ADL's / Homemaking Assistance Needed: Husband reports she has required MIN assist for ADLs since stroke ~2 weeks prior         Hand Dominance   Dominant Hand: Right     Extremity/Trunk Assessment   Upper Extremity Assessment Upper Extremity Assessment: Defer to OT evaluation    Lower Extremity Assessment Lower Extremity Assessment: Generalized weakness       Communication   Communication: Expressive difficulties;Receptive difficulties  Cognition Arousal/Alertness: Awake/alert Behavior During Therapy: WFL for tasks assessed/performed Overall Cognitive Status: History of cognitive impairments - at baseline                                 General Comments: Per husband in room pt is at baseline. Pt requires repetition of requests and reasoning - impulsive and appears anxious to return home       General Comments General comments (skin integrity, edema, etc.): SpO2 desat low 90s following chair transfer - resolved c rest     Exercises Other Exercises Other Exercises: Pt and spouse educated for pt to use RW with SBA during ambulation until instructed otherwise by HHPT Other Exercises: LBD, self-feeding, sup<>sit, sit<>stand x2, simulated toilet t/f, sitting/standing balance/tolerance   Assessment/Plan    PT Assessment Patient needs continued PT services  PT Problem List Decreased strength;Decreased activity tolerance;Decreased balance;Decreased mobility;Decreased knowledge of use of DME       PT Treatment Interventions DME instruction;Gait training;Stair training;Functional mobility training;Therapeutic activities;Therapeutic exercise;Balance training;Patient/family education    PT Goals (Current goals can be found in the Care Plan section)  Acute Rehab PT Goals Patient Stated Goal: To go home today PT Goal Formulation: With patient Time For Goal Achievement: 07/11/20 Potential to Achieve Goals: Good    Frequency Min 2X/week   Barriers to discharge        Co-evaluation               AM-PAC PT "6 Clicks" Mobility  Outcome Measure Help needed turning from your back to your side while in a flat bed without using  bedrails?: A Little Help needed moving from lying on your back to sitting on the side of a flat bed without using bedrails?: A Little Help needed moving to and from a bed to a chair (including a wheelchair)?: A Little Help needed standing up from a chair using your arms (e.g., wheelchair or bedside chair)?: A Little Help needed to walk in hospital room?: A Little Help needed climbing 3-5 steps with a railing? : A Little 6 Click Score: 18    End of Session Equipment Utilized During Treatment: Gait belt Activity Tolerance: Patient tolerated treatment well Patient left: in chair;with call bell/phone within reach;with family/visitor present Nurse Communication: Mobility status PT Visit Diagnosis: Muscle weakness (generalized) (M62.81);Difficulty in walking, not elsewhere classified (R26.2)    Time: 1165-7903 PT Time Calculation (min) (ACUTE ONLY): 27 min   Charges:   PT Evaluation $PT Eval Moderate Complexity: 1 Mod          D. Scott Calib Wadhwa PT, DPT 06/28/20, 3:11 PM

## 2020-06-28 NOTE — Progress Notes (Signed)
Notified Dr. Reesa Chew that pt's BP was still in the 170s after home dose of cozaar and IV hydralazine. One time dose of IV hydralazine ordered and then she can be discharged per Dr. Reesa Chew. Attempted to call PCP office to schedule follow up appt but no appts available for next week. PCP office will call pt Monday morning to set up appt.

## 2020-06-28 NOTE — Progress Notes (Signed)
SLP Cancellation Note  Patient Details Name: Linda [name] MRN: [id number] DOB: August 31, 1946   Cancelled treatment:       Reason Eval/Treat Not Completed: SLP screened, no needs identified, will sign off (chart reviewed; no NEW needs per pt/husband/NSG). Pt was just seen on 06/15/2020 by this service for a Cognitive-linguistic evaluation. Pt had Baseline Aphasia then from a Prior CVA. The recommendation was made for f/u w/ [facility] services for Aphasia to address communication skills in ADLs.  Recommend the same for discharge this admit -- f/u w/ [facility] services for Aphasia. Contacted SW to f/u w/ pt and Husband to arrange; Husband reported no communication w/ a [place] service for ST post last discharge. NSG updated re: POC and agreed. MD updated.     Orinda Kenner, MS, CCC-SLP [name] 06/28/2020, 10:48 AM

## 2020-06-28 NOTE — Discharge Instructions (Signed)

## 2020-06-28 NOTE — Progress Notes (Signed)
Pt discharged home at this time with husband. VSS prior to discharge. Discharge education given to and reviewed with patient and husband.

## 2020-08-02 ENCOUNTER — Other Ambulatory Visit (INDEPENDENT_AMBULATORY_CARE_PROVIDER_SITE_OTHER): Payer: Self-pay | Admitting: Vascular Surgery

## 2020-08-02 DIAGNOSIS — I679 Cerebrovascular disease, unspecified: Secondary | ICD-10-CM

## 2020-08-05 ENCOUNTER — Ambulatory Visit (INDEPENDENT_AMBULATORY_CARE_PROVIDER_SITE_OTHER): Payer: Medicare Other

## 2020-08-05 ENCOUNTER — Other Ambulatory Visit (INDEPENDENT_AMBULATORY_CARE_PROVIDER_SITE_OTHER): Payer: Self-pay | Admitting: Nephrology

## 2020-08-05 ENCOUNTER — Other Ambulatory Visit: Payer: Self-pay

## 2020-08-05 DIAGNOSIS — I679 Cerebrovascular disease, unspecified: Secondary | ICD-10-CM

## 2020-08-05 DIAGNOSIS — I10 Essential (primary) hypertension: Secondary | ICD-10-CM | POA: Diagnosis not present

## 2020-09-24 ENCOUNTER — Other Ambulatory Visit: Payer: Self-pay | Admitting: Neurosurgery

## 2020-09-24 DIAGNOSIS — E236 Other disorders of pituitary gland: Secondary | ICD-10-CM

## 2020-10-15 ENCOUNTER — Other Ambulatory Visit: Payer: Self-pay

## 2020-10-15 ENCOUNTER — Ambulatory Visit
Admission: RE | Admit: 2020-10-15 | Discharge: 2020-10-15 | Disposition: A | Discharge status: Home / Self Care | Payer: Medicare Other | Source: Ambulatory Visit | Attending: Neurosurgery | Admitting: Neurosurgery

## 2020-10-15 DIAGNOSIS — E236 Other disorders of pituitary gland: Secondary | ICD-10-CM | POA: Diagnosis present

## 2020-10-15 MED ORDER — GADOBUTROL 1 MMOL/ML IV SOLN
6.0000 mL | Freq: Once | INTRAVENOUS | Status: AC | PRN
  Administered 2020-10-15: 6 mL via INTRAVENOUS

## 2021-01-13 ENCOUNTER — Telehealth: Payer: Self-pay | Admitting: Family Medicine

## 2021-01-13 NOTE — Telephone Encounter (Signed)
Patient's husband called and states patient has been on this medication. Last year the price was affordable and this year it is to expensive. They cannot afford the medication. He is wanting to know if another prescription can be sent in that will be more cost effective. Please advise

## 2021-01-15 NOTE — Telephone Encounter (Signed)
There is not a generic for mybetriq. She is not a candidate for anticholinergics secondary to her fall risk/dementia.  They can see if another pharmacy has it for a better price, or schedule follow up with me or PA to discuss PTNS, thanks  Nickolas Madrid, MD 01/15/2021

## 2021-01-16 NOTE — Telephone Encounter (Signed)
Called pt's husband informed him of the information below. He states that he has spoken with [facility] and they informed him that the medication was more in January, but will come back down to $47/month beginning in February. He states he will be able to afford this and will keep patient on this medication.

## 2021-02-25 ENCOUNTER — Other Ambulatory Visit
Admission: RE | Admit: 2021-02-25 | Discharge: 2021-02-25 | Disposition: A | Discharge status: Home / Self Care | Payer: Medicare Other | Attending: Urology | Admitting: Urology

## 2021-02-25 ENCOUNTER — Other Ambulatory Visit: Payer: Self-pay

## 2021-02-25 ENCOUNTER — Encounter: Payer: Self-pay | Admitting: Urology

## 2021-02-25 ENCOUNTER — Ambulatory Visit: Payer: Medicare Other | Admitting: Urology

## 2021-02-25 ENCOUNTER — Other Ambulatory Visit: Payer: Self-pay | Admitting: *Deleted

## 2021-02-25 ENCOUNTER — Telehealth: Payer: Medicare Other | Admitting: Urology

## 2021-02-25 ENCOUNTER — Ambulatory Visit (INDEPENDENT_AMBULATORY_CARE_PROVIDER_SITE_OTHER): Payer: Medicare Other | Admitting: Urology

## 2021-02-25 DIAGNOSIS — N3281 Overactive bladder: Secondary | ICD-10-CM | POA: Diagnosis present

## 2021-02-25 LAB — URINALYSIS, COMPLETE (UACMP) WITH MICROSCOPIC
Bilirubin Urine: NEGATIVE
Glucose, UA: NEGATIVE mg/dL
Ketones, ur: NEGATIVE mg/dL
Nitrite: NEGATIVE
Protein, ur: NEGATIVE mg/dL
Specific Gravity, Urine: 1.02 (ref 1.005–1.030)
pH: 7 (ref 5.0–8.0)

## 2021-02-25 MED ORDER — MIRABEGRON ER 50 MG PO TB24: 50.0000 mg | ORAL_TABLET | Freq: Every day | ORAL | 11 refills | Status: DC

## 2021-02-25 NOTE — Progress Notes (Signed)
See virtual visit documentation

## 2021-02-25 NOTE — Progress Notes (Signed)
Virtual Visit via Telephone Note  I connected with Tawanna Cooler husband Jakiah Bienaime on 02/25/21 at 10:00 AM EDT by telephone and verified that I am speaking with the correct person using two identifiers.   Patient location: Home Provider location: [facility]   I discussed the limitations, risks, security and privacy concerns of performing an evaluation and management service by telephone and the availability of in person appointments. We discussed the impact of the COVID-19 pandemic on the healthcare system, and the importance of social distancing and reducing patient and provider exposure. I also discussed with the patient that there may be a patient responsible charge related to this service. The patient expressed understanding and agreed to proceed.  Reason for visit: OAB  History of Present Illness: Today's visit was scheduled for in person follow-up, however apparently she was unable to void at the lab and was sent home instead of upstairs to our visit today.  I changed to a virtual and called her today.  She has dementia and history of stroke and her husband provides the history.  She is a very co-morbid and frail 75 year old female with history notable for vascular dementia, stroke, and CKD.  There are numerous notes documenting sleep apnea, but her husband refutes this today and says that she does not use a CPAP machine or have sleep apnea.  She is currently on 50 mg Myrbetriq daily which has made a moderate improvement in her urinary urgency, frequency, and nocturia.  She is doing well during the day and not having any significant urgency or incontinence, however the primary issue is incontinence overnight requiring a depends.  We again discussed behavioral strategies at length.    I think we need to have realistic goals with her comorbidities. I reviewed the importance of timed voiding during the day, minimizing bladder irritants, minimizing fluids after 6 PM, and  voiding prior to bed.   Continue Myrbetriq 50 mg daily (avoid anticholinergics with her age and frailty) Consider PTNS in the future if persistently bothersome symptoms Continue yearly follow-up   I discussed the assessment and treatment plan with the patient. The patient was provided an opportunity to ask questions and all were answered. The patient agreed with the plan and demonstrated an understanding of the instructions.   The patient was advised to call back or seek an in-person evaluation if the symptoms worsen or if the condition fails to improve as anticipated.  I provided 12 minutes of non-face-to-face time during this encounter.   Billey Co, MD

## 2021-02-25 NOTE — Addendum Note (Signed)
Addended by: Despina Hidden on: 02/25/2021 08:52 AM   Modules accepted: Orders

## 2021-05-18 ENCOUNTER — Other Ambulatory Visit: Payer: Self-pay | Admitting: Urology

## 2021-08-26 ENCOUNTER — Other Ambulatory Visit: Payer: Self-pay | Admitting: Nephrology

## 2021-08-26 DIAGNOSIS — N184 Chronic kidney disease, stage 4 (severe): Secondary | ICD-10-CM

## 2021-08-26 DIAGNOSIS — D638 Anemia in other chronic diseases classified elsewhere: Secondary | ICD-10-CM

## 2021-08-26 DIAGNOSIS — I10 Essential (primary) hypertension: Secondary | ICD-10-CM

## 2021-09-10 ENCOUNTER — Other Ambulatory Visit: Payer: Self-pay

## 2021-09-10 ENCOUNTER — Ambulatory Visit
Admission: RE | Admit: 2021-09-10 | Discharge: 2021-09-10 | Disposition: A | Discharge status: Home / Self Care | Payer: Medicare Other | Source: Ambulatory Visit | Attending: Nephrology | Admitting: Nephrology

## 2021-09-10 DIAGNOSIS — N184 Chronic kidney disease, stage 4 (severe): Secondary | ICD-10-CM | POA: Insufficient documentation

## 2021-09-10 DIAGNOSIS — D638 Anemia in other chronic diseases classified elsewhere: Secondary | ICD-10-CM | POA: Diagnosis present

## 2021-09-10 DIAGNOSIS — I10 Essential (primary) hypertension: Secondary | ICD-10-CM | POA: Insufficient documentation

## 2021-09-12 ENCOUNTER — Other Ambulatory Visit: Payer: Self-pay | Admitting: Neurosurgery

## 2021-09-12 DIAGNOSIS — E236 Other disorders of pituitary gland: Secondary | ICD-10-CM

## 2021-10-01 IMAGING — MR MR HEAD WO/W CM
14 series · 48 of 48 positions shown · IV contrast (gadavist)
Comparison: CT angio head and neck today

CLINICAL DATA: Speech difficulty.  History of aneurysm repair

EXAM:
MRI HEAD WITHOUT AND WITH CONTRAST
TECHNIQUE: Multiplanar, multiecho pulse sequences of the brain and surrounding
structures were obtained without and with intravenous contrast.
CONTRAST:  6mL GADAVIST GADOBUTROL 1 MMOL/ML IV SOLN

[Series 5: ax dwi_tracew · axial · 3.0mm · 0.60mm/px · z∈[-131,+22]mm · 4 of 48 slices shown]
[im 1/48]
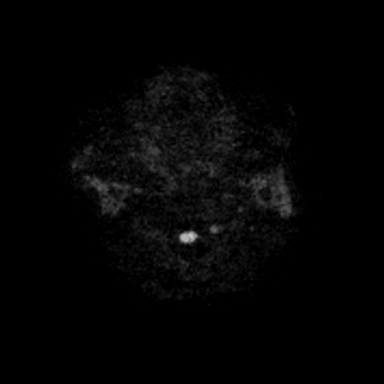
[im 16/48]
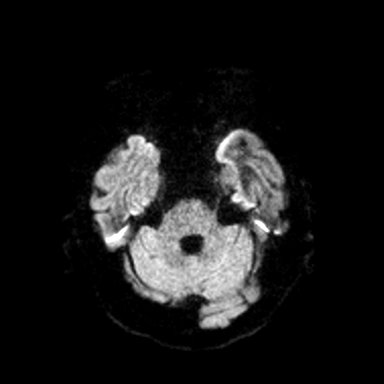
[im 32/48]
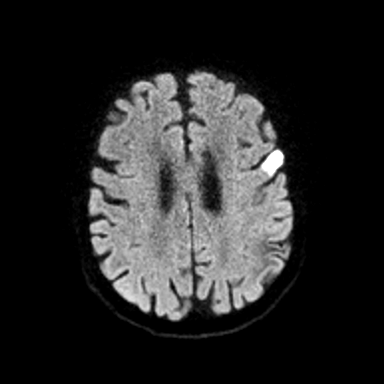
[im 48/48]
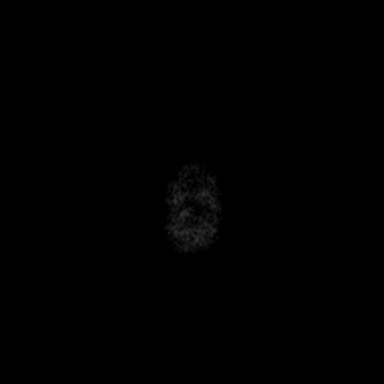

[Series 6: ax dwi_adc · axial · 3.0mm · 0.60mm/px · z∈[-131,+22]mm · 3 of 48 slices shown]
[im 1/48]
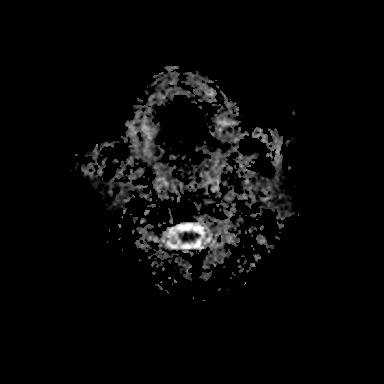
[im 24/48]
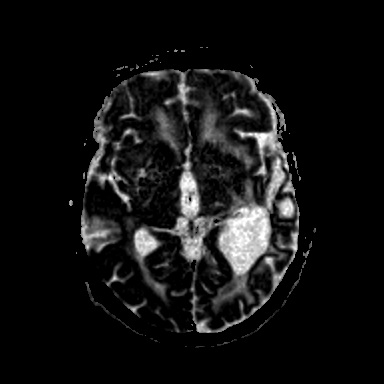
[im 48/48]
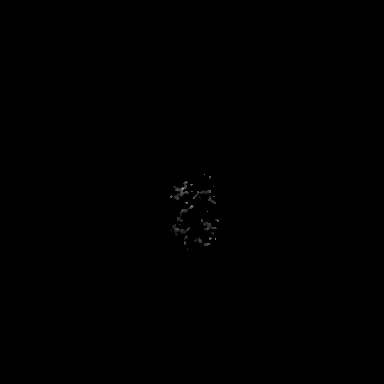

[Series 7: cor dwi_tracew · coronal · 5.0mm · 1.31mm/px · 2 of 34 slices shown]
[im 1/34]
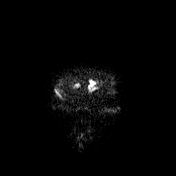
[im 34/34]
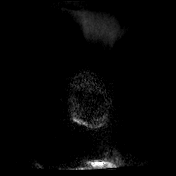

[Series 8: cor dwi_adc · coronal · 5.0mm · 1.31mm/px · 2 of 34 slices shown]
[im 1/34]
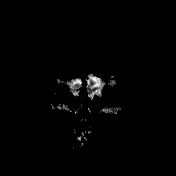
[im 34/34]
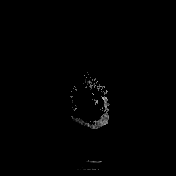

[Series 9: T1 · sagittal · 5.0mm · 0.62mm/px · 1 of 21 slices shown (1 of 2)]
[im 1/21]
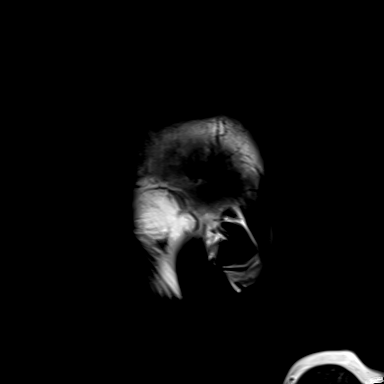

[Series 10: T2 · axial · 5.0mm · 0.53mm/px · z∈[-129,+25]mm · 2 of 27 slices shown]
[im 1/27]
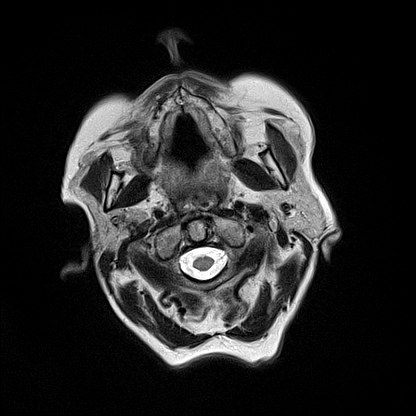
[im 27/27]
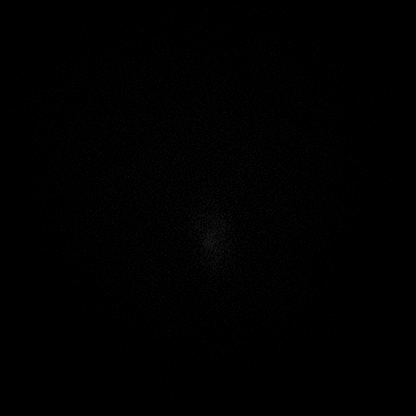

[Series 12: pha_images · axial · 4.0mm · 0.90mm/px · z∈[-131,+23]mm · 2 of 40 slices shown]
[im 1/40]
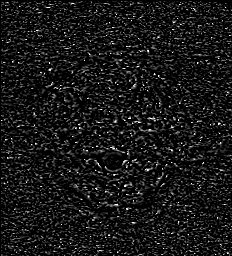
[im 40/40]
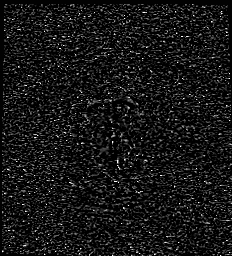

[Series 13: swi_images · axial · 4.0mm · 0.90mm/px · z∈[-131,+23]mm · 2 of 40 slices shown]
[im 1/40]
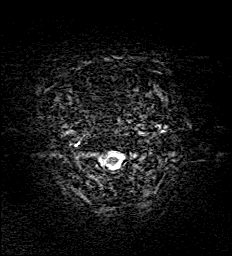
[im 40/40]
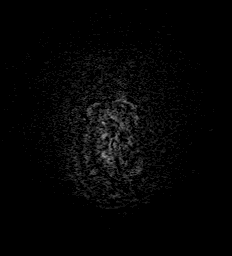

[Series 15: FLAIR · axial · 3.0mm · 0.69mm/px · z∈[-135,+25]mm · 3 of 55 slices shown]
[im 1/55]
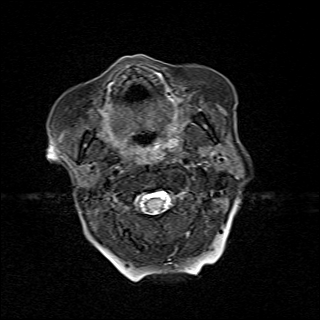
[im 28/55]
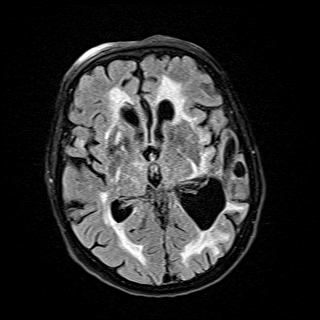
[im 55/55]
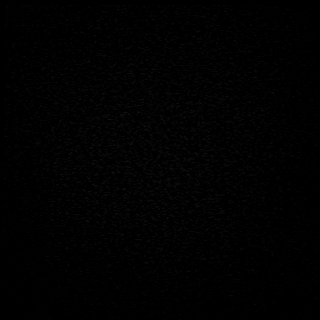

[Series 16: T1 · axial · 1.0mm · 0.98mm/px · z∈[-135,+38]mm · 11 of 175 slices shown (2 of 2)]
[im 1/175]
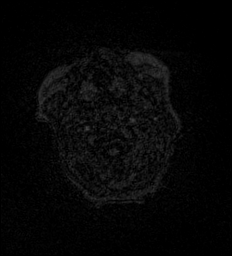
[im 18/175]
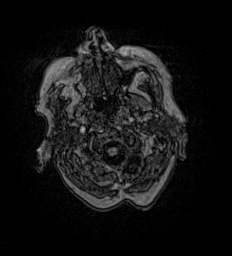
[im 35/175]
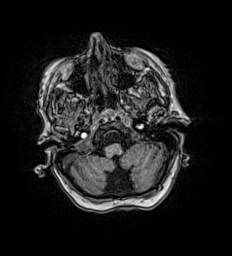
[im 53/175]
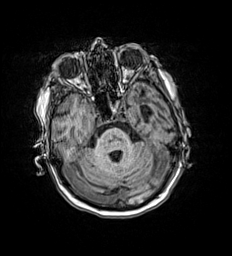
[im 70/175]
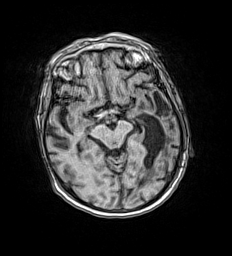
[im 88/175]
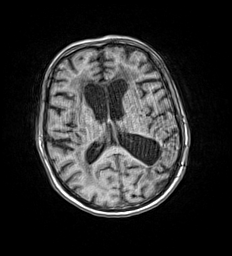
[im 105/175]
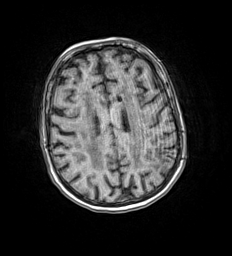
[im 122/175]
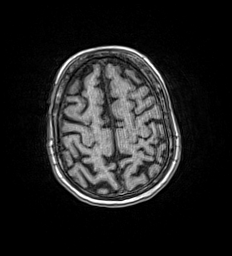
[im 140/175]
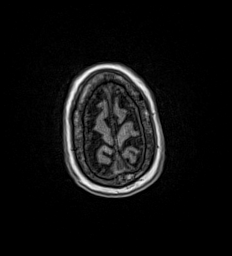
[im 157/175]
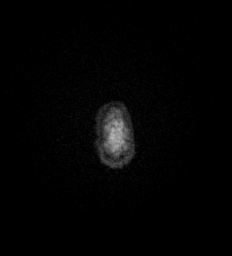
[im 175/175]
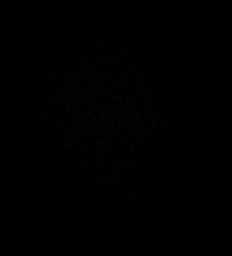

[Series 17: T2 post-contrast · coronal · 5.0mm · 0.57mm/px · 2 of 27 slices shown]
[im 1/27]
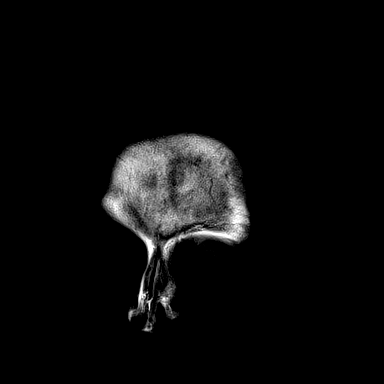
[im 27/27]
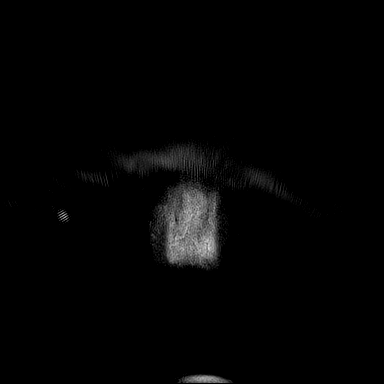

[Series 18: T1 post-contrast · axial · 1.0mm · 0.98mm/px · z∈[-135,+38]mm · 11 of 176 slices shown (1 of 3)]
[im 1/176]
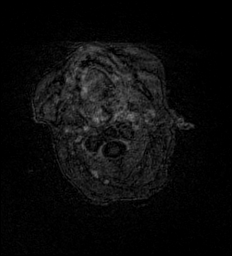
[im 18/176]
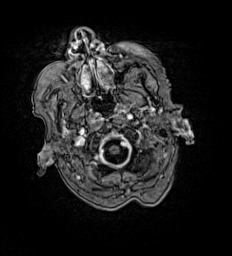
[im 36/176]
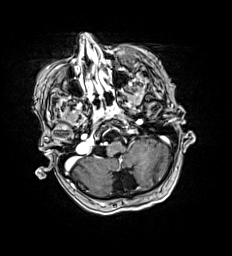
[im 53/176]
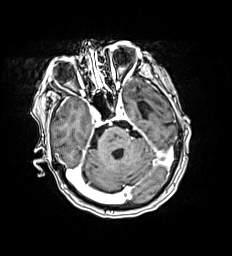
[im 71/176]
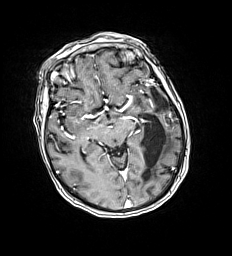
[im 88/176]
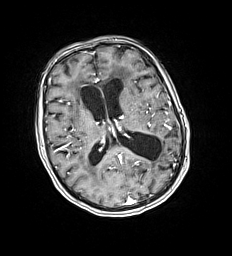
[im 106/176]
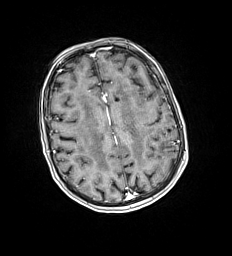
[im 123/176]
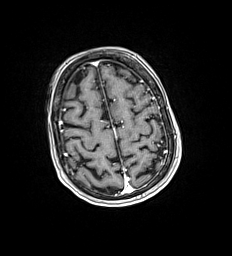
[im 141/176]
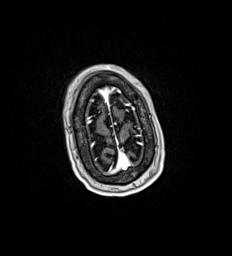
[im 158/176]
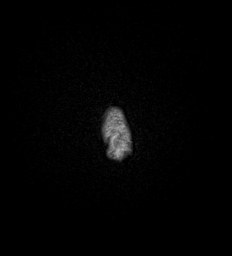
[im 176/176]
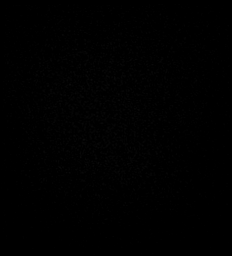

[Series 19: T1 post-contrast · coronal · 5.0mm · 0.57mm/px · 2 of 27 slices shown (2 of 3)]
[im 1/27]
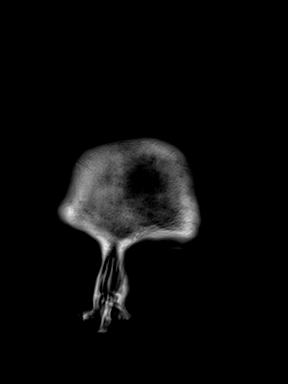
[im 27/27]
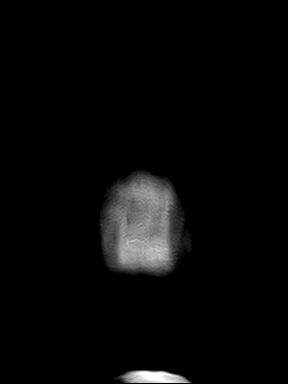

[Series 20: T1 post-contrast · sagittal · 5.0mm · 0.62mm/px · 1 of 21 slices shown (3 of 3)]
[im 1/21]
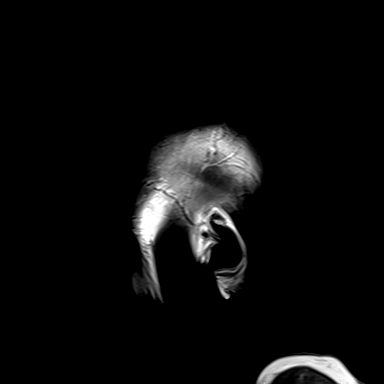

[48 of 48 positions shown; findings below may reference images not displayed]

FINDINGS: Brain: Acute infarct left mid frontal gyrus. No associated
hemorrhage. No other acute infarct.

Chronic hemorrhagic infarct left temporal lobe. Prior coiling of
left MCA aneurysm.

Moderate atrophy. Extensive chronic microvascular ischemic change in
the white matter and pons. Small chronic infarcts in the right
cerebellum. Chronic infarcts in the basal ganglia bilaterally and
thalamus bilaterally.

Small enhancing suprasellar mass measuring approximately 5 x 6 mm as
noted on CT. This appears separate from the pituitary and is in the
midline near the infundibulum. Anatomic detail is limited due to
motion and lack of pituitary protocol. The pituitary is normal in
size

Vascular: Normal arterial flow voids. Coiling of left MCA
bifurcation aneurysm.

Skull and upper cervical spine: No focal skeletal lesion.

Sinuses/Orbits: Mild mucosal edema paranasal sinuses. Bilateral
cataract extraction

Other: None
IMPRESSION: Acute infarct in the left middle frontal gyrus.

Atrophy and extensive chronic ischemic change. Chronic hemorrhagic
infarct left temporal lobe.

5 x 6 mm enhancing suprasellar mass as noted on CT. Given the
enhancing nature, Rathke's cleft cyst not considered likely.
Possibilities include sarcoidosis, lymphoma, metastatic disease,
craniopharyngioma, and other inflammatory processes of the
infundibulum.

## 2021-10-08 ENCOUNTER — Other Ambulatory Visit (INDEPENDENT_AMBULATORY_CARE_PROVIDER_SITE_OTHER): Payer: Self-pay | Admitting: Nephrology

## 2021-10-08 DIAGNOSIS — N184 Chronic kidney disease, stage 4 (severe): Secondary | ICD-10-CM

## 2021-10-08 DIAGNOSIS — I10 Essential (primary) hypertension: Secondary | ICD-10-CM

## 2021-10-15 ENCOUNTER — Other Ambulatory Visit: Payer: Self-pay

## 2021-10-15 ENCOUNTER — Ambulatory Visit
Admission: RE | Admit: 2021-10-15 | Discharge: 2021-10-15 | Disposition: A | Discharge status: Home / Self Care | Payer: Medicare Other | Source: Ambulatory Visit | Attending: Neurosurgery | Admitting: Neurosurgery

## 2021-10-15 DIAGNOSIS — E236 Other disorders of pituitary gland: Secondary | ICD-10-CM | POA: Insufficient documentation

## 2021-10-15 MED ORDER — GADOBUTROL 1 MMOL/ML IV SOLN
7.0000 mL | Freq: Once | INTRAVENOUS | Status: AC | PRN
  Administered 2021-10-15: 7 mL via INTRAVENOUS

## 2021-10-31 ENCOUNTER — Observation Stay: Payer: Medicare Other

## 2021-10-31 ENCOUNTER — Observation Stay
Admit: 2021-10-31 | Discharge: 2021-10-31 | Disposition: A | Discharge status: Home / Self Care | Payer: Medicare Other | Attending: Internal Medicine | Admitting: Internal Medicine

## 2021-10-31 ENCOUNTER — Inpatient Hospital Stay
Admission: EM | Admit: 2021-10-31 | Discharge: 2021-11-05 | DRG: 314 | Disposition: A | Discharge status: Home / Self Care | Payer: Medicare Other | Attending: Internal Medicine | Admitting: Internal Medicine

## 2021-10-31 ENCOUNTER — Emergency Department: Payer: Medicare Other

## 2021-10-31 ENCOUNTER — Encounter: Payer: Self-pay | Admitting: Emergency Medicine

## 2021-10-31 ENCOUNTER — Other Ambulatory Visit: Payer: Self-pay

## 2021-10-31 DIAGNOSIS — I69320 Aphasia following cerebral infarction: Secondary | ICD-10-CM

## 2021-10-31 DIAGNOSIS — Z9841 Cataract extraction status, right eye: Secondary | ICD-10-CM

## 2021-10-31 DIAGNOSIS — D631 Anemia in chronic kidney disease: Secondary | ICD-10-CM | POA: Diagnosis present

## 2021-10-31 DIAGNOSIS — G4730 Sleep apnea, unspecified: Secondary | ICD-10-CM | POA: Diagnosis present

## 2021-10-31 DIAGNOSIS — B9620 Unspecified Escherichia coli [E. coli] as the cause of diseases classified elsewhere: Secondary | ICD-10-CM | POA: Diagnosis present

## 2021-10-31 DIAGNOSIS — N3281 Overactive bladder: Secondary | ICD-10-CM | POA: Diagnosis present

## 2021-10-31 DIAGNOSIS — F0153 Vascular dementia, unspecified severity, with mood disturbance: Secondary | ICD-10-CM | POA: Diagnosis present

## 2021-10-31 DIAGNOSIS — I671 Cerebral aneurysm, nonruptured: Secondary | ICD-10-CM | POA: Diagnosis present

## 2021-10-31 DIAGNOSIS — I5042 Chronic combined systolic (congestive) and diastolic (congestive) heart failure: Secondary | ICD-10-CM | POA: Diagnosis present

## 2021-10-31 DIAGNOSIS — M6282 Rhabdomyolysis: Secondary | ICD-10-CM | POA: Diagnosis present

## 2021-10-31 DIAGNOSIS — J690 Pneumonitis due to inhalation of food and vomit: Secondary | ICD-10-CM | POA: Diagnosis present

## 2021-10-31 DIAGNOSIS — N390 Urinary tract infection, site not specified: Secondary | ICD-10-CM | POA: Diagnosis present

## 2021-10-31 DIAGNOSIS — J189 Pneumonia, unspecified organism: Secondary | ICD-10-CM | POA: Diagnosis present

## 2021-10-31 DIAGNOSIS — R52 Pain, unspecified: Secondary | ICD-10-CM

## 2021-10-31 DIAGNOSIS — R4182 Altered mental status, unspecified: Secondary | ICD-10-CM

## 2021-10-31 DIAGNOSIS — F418 Other specified anxiety disorders: Secondary | ICD-10-CM | POA: Diagnosis not present

## 2021-10-31 DIAGNOSIS — Z961 Presence of intraocular lens: Secondary | ICD-10-CM | POA: Diagnosis present

## 2021-10-31 DIAGNOSIS — N184 Chronic kidney disease, stage 4 (severe): Secondary | ICD-10-CM | POA: Diagnosis present

## 2021-10-31 DIAGNOSIS — Z8249 Family history of ischemic heart disease and other diseases of the circulatory system: Secondary | ICD-10-CM

## 2021-10-31 DIAGNOSIS — R4701 Aphasia: Secondary | ICD-10-CM

## 2021-10-31 DIAGNOSIS — D6489 Other specified anemias: Secondary | ICD-10-CM | POA: Diagnosis present

## 2021-10-31 DIAGNOSIS — F0154 Vascular dementia, unspecified severity, with anxiety: Secondary | ICD-10-CM | POA: Diagnosis present

## 2021-10-31 DIAGNOSIS — R262 Difficulty in walking, not elsewhere classified: Secondary | ICD-10-CM | POA: Diagnosis present

## 2021-10-31 DIAGNOSIS — I639 Cerebral infarction, unspecified: Secondary | ICD-10-CM | POA: Diagnosis not present

## 2021-10-31 DIAGNOSIS — I130 Hypertensive heart and chronic kidney disease with heart failure and stage 1 through stage 4 chronic kidney disease, or unspecified chronic kidney disease: Secondary | ICD-10-CM | POA: Diagnosis present

## 2021-10-31 DIAGNOSIS — I5181 Takotsubo syndrome: Principal | ICD-10-CM | POA: Diagnosis present

## 2021-10-31 DIAGNOSIS — R778 Other specified abnormalities of plasma proteins: Secondary | ICD-10-CM | POA: Diagnosis present

## 2021-10-31 DIAGNOSIS — I248 Other forms of acute ischemic heart disease: Secondary | ICD-10-CM | POA: Diagnosis present

## 2021-10-31 DIAGNOSIS — A419 Sepsis, unspecified organism: Secondary | ICD-10-CM | POA: Diagnosis present

## 2021-10-31 DIAGNOSIS — I10 Essential (primary) hypertension: Secondary | ICD-10-CM | POA: Diagnosis not present

## 2021-10-31 DIAGNOSIS — Z9842 Cataract extraction status, left eye: Secondary | ICD-10-CM

## 2021-10-31 DIAGNOSIS — E538 Deficiency of other specified B group vitamins: Secondary | ICD-10-CM | POA: Diagnosis present

## 2021-10-31 DIAGNOSIS — Z7902 Long term (current) use of antithrombotics/antiplatelets: Secondary | ICD-10-CM

## 2021-10-31 DIAGNOSIS — Z87891 Personal history of nicotine dependence: Secondary | ICD-10-CM

## 2021-10-31 DIAGNOSIS — I5022 Chronic systolic (congestive) heart failure: Secondary | ICD-10-CM | POA: Diagnosis not present

## 2021-10-31 DIAGNOSIS — J9601 Acute respiratory failure with hypoxia: Secondary | ICD-10-CM | POA: Diagnosis present

## 2021-10-31 DIAGNOSIS — W19XXXA Unspecified fall, initial encounter: Secondary | ICD-10-CM

## 2021-10-31 DIAGNOSIS — N179 Acute kidney failure, unspecified: Secondary | ICD-10-CM | POA: Diagnosis present

## 2021-10-31 DIAGNOSIS — G459 Transient cerebral ischemic attack, unspecified: Secondary | ICD-10-CM | POA: Diagnosis present

## 2021-10-31 DIAGNOSIS — Z20822 Contact with and (suspected) exposure to covid-19: Secondary | ICD-10-CM | POA: Diagnosis present

## 2021-10-31 DIAGNOSIS — E7800 Pure hypercholesterolemia, unspecified: Secondary | ICD-10-CM | POA: Diagnosis present

## 2021-10-31 DIAGNOSIS — R809 Proteinuria, unspecified: Secondary | ICD-10-CM | POA: Diagnosis present

## 2021-10-31 DIAGNOSIS — E785 Hyperlipidemia, unspecified: Secondary | ICD-10-CM | POA: Diagnosis not present

## 2021-10-31 DIAGNOSIS — Z79899 Other long term (current) drug therapy: Secondary | ICD-10-CM

## 2021-10-31 LAB — URINALYSIS, COMPLETE (UACMP) WITH MICROSCOPIC
Bilirubin Urine: NEGATIVE
Glucose, UA: NEGATIVE mg/dL
Ketones, ur: NEGATIVE mg/dL
Nitrite: NEGATIVE
Protein, ur: 30 mg/dL — AB
Specific Gravity, Urine: 1.011 (ref 1.005–1.030)
Squamous Epithelial / HPF: NONE SEEN (ref 0–5)
pH: 5 (ref 5.0–8.0)

## 2021-10-31 LAB — CBC
HCT: 33 % — ABNORMAL LOW (ref 36.0–46.0)
Hemoglobin: 10.5 g/dL — ABNORMAL LOW (ref 12.0–15.0)
MCH: 31.5 pg (ref 26.0–34.0)
MCHC: 31.8 g/dL (ref 30.0–36.0)
MCV: 99.1 fL (ref 80.0–100.0)
Platelets: 291 10*3/uL (ref 150–400)
RBC: 3.33 MIL/uL — ABNORMAL LOW (ref 3.87–5.11)
RDW: 13.4 % (ref 11.5–15.5)
WBC: 14.4 10*3/uL — ABNORMAL HIGH (ref 4.0–10.5)
nRBC: 0 % (ref 0.0–0.2)

## 2021-10-31 LAB — ECHOCARDIOGRAM COMPLETE
AR max vel: 3.78 cm2
AV Area VTI: 4.42 cm2
AV Area mean vel: 3.81 cm2
AV Mean grad: 1.5 mmHg
AV Peak grad: 3.3 mmHg
Ao pk vel: 0.9 m/s
Area-P 1/2: 5.34 cm2
Calc EF: 32.5 %
Height: 66 in
MV VTI: 3.88 cm2
S' Lateral: 3.4 cm
Single Plane A2C EF: 15.9 %
Single Plane A4C EF: 45.6 %
Weight: 2564.39 oz

## 2021-10-31 LAB — COMPREHENSIVE METABOLIC PANEL
ALT: 23 U/L (ref 0–44)
AST: 59 U/L — ABNORMAL HIGH (ref 15–41)
Albumin: 3.9 g/dL (ref 3.5–5.0)
Alkaline Phosphatase: 48 U/L (ref 38–126)
Anion gap: 10 (ref 5–15)
BUN: 52 mg/dL — ABNORMAL HIGH (ref 8–23)
CO2: 20 mmol/L — ABNORMAL LOW (ref 22–32)
Calcium: 9.2 mg/dL (ref 8.9–10.3)
Chloride: 106 mmol/L (ref 98–111)
Creatinine, Ser: 2.94 mg/dL — ABNORMAL HIGH (ref 0.44–1.00)
GFR, Estimated: 16 mL/min — ABNORMAL LOW (ref >60)
Glucose, Bld: 118 mg/dL — ABNORMAL HIGH (ref 70–99)
Potassium: 5.1 mmol/L (ref 3.5–5.1)
Sodium: 136 mmol/L (ref 135–145)
Total Bilirubin: 0.5 mg/dL (ref 0.3–1.2)
Total Protein: 8.2 g/dL — ABNORMAL HIGH (ref 6.5–8.1)

## 2021-10-31 LAB — STREP PNEUMONIAE URINARY ANTIGEN: Strep Pneumo Urinary Antigen: NEGATIVE

## 2021-10-31 LAB — PROCALCITONIN: Procalcitonin: 0.79 ng/mL

## 2021-10-31 LAB — DIFFERENTIAL
Abs Immature Granulocytes: 0.09 10*3/uL — ABNORMAL HIGH (ref 0.00–0.07)
Basophils Absolute: 0 10*3/uL (ref 0.0–0.1)
Basophils Relative: 0 %
Eosinophils Absolute: 0 10*3/uL (ref 0.0–0.5)
Eosinophils Relative: 0 %
Immature Granulocytes: 1 %
Lymphocytes Relative: 7 %
Lymphs Abs: 1.1 10*3/uL (ref 0.7–4.0)
Monocytes Absolute: 0.8 10*3/uL (ref 0.1–1.0)
Monocytes Relative: 6 %
Neutro Abs: 12.4 10*3/uL — ABNORMAL HIGH (ref 1.7–7.7)
Neutrophils Relative %: 86 %

## 2021-10-31 LAB — APTT: aPTT: 28 seconds (ref 24–36)

## 2021-10-31 LAB — TROPONIN I (HIGH SENSITIVITY)
Troponin I (High Sensitivity): 6595 ng/L (ref <18)
Troponin I (High Sensitivity): 5462 ng/L (ref <18)
Troponin I (High Sensitivity): 4565 ng/L (ref <18)

## 2021-10-31 LAB — LACTIC ACID, PLASMA: Lactic Acid, Venous: 1.6 mmol/L (ref 0.5–1.9)

## 2021-10-31 LAB — PROTIME-INR
INR: 1.1 (ref 0.8–1.2)
Prothrombin Time: 14 seconds (ref 11.4–15.2)

## 2021-10-31 LAB — TSH: TSH: 6.691 u[IU]/mL — ABNORMAL HIGH (ref 0.350–4.500)

## 2021-10-31 LAB — RESP PANEL BY RT-PCR (FLU A&B, COVID) ARPGX2
Influenza A by PCR: NEGATIVE
Influenza B by PCR: NEGATIVE
SARS Coronavirus 2 by RT PCR: NEGATIVE

## 2021-10-31 LAB — BRAIN NATRIURETIC PEPTIDE: B Natriuretic Peptide: 502.7 pg/mL — ABNORMAL HIGH (ref 0.0–100.0)

## 2021-10-31 LAB — CBG MONITORING, ED: Glucose-Capillary: 127 mg/dL — ABNORMAL HIGH (ref 70–99)

## 2021-10-31 LAB — CK: Total CK: 2335 U/L — ABNORMAL HIGH (ref 38–234)

## 2021-10-31 MED ORDER — SENNOSIDES-DOCUSATE SODIUM 8.6-50 MG PO TABS: 1.0000 | ORAL_TABLET | Freq: Every evening | ORAL | Status: DC | PRN

## 2021-10-31 MED ORDER — ACETAMINOPHEN 325 MG PO TABS
650.0000 mg | ORAL_TABLET | ORAL | Status: DC | PRN
  Administered 2021-11-01 – 2021-11-04 (×4): 650 mg via ORAL
  Filled 2021-10-31 (×4): qty 2

## 2021-10-31 MED ORDER — DM-GUAIFENESIN ER 30-600 MG PO TB12: 1.0000 | ORAL_TABLET | Freq: Two times a day (BID) | ORAL | Status: DC | PRN

## 2021-10-31 MED ORDER — ALBUTEROL SULFATE (2.5 MG/3ML) 0.083% IN NEBU
2.5000 mg | INHALATION_SOLUTION | RESPIRATORY_TRACT | Status: DC | PRN
  Administered 2021-11-04: 2.5 mg via RESPIRATORY_TRACT
  Filled 2021-10-31: qty 3

## 2021-10-31 MED ORDER — SODIUM CHLORIDE 0.9 % IV SOLN
1.0000 g | INTRAVENOUS | Status: DC
  Administered 2021-10-31 – 2021-11-03 (×4): 1 g via INTRAVENOUS
  Filled 2021-10-31 (×2): qty 10
  Filled 2021-10-31 (×2): qty 1
  Filled 2021-10-31: qty 10

## 2021-10-31 MED ORDER — ATORVASTATIN CALCIUM 20 MG PO TABS
40.0000 mg | ORAL_TABLET | Freq: Every day | ORAL | Status: DC
  Administered 2021-10-31: 40 mg via ORAL
  Filled 2021-10-31: qty 2

## 2021-10-31 MED ORDER — ACETAMINOPHEN 160 MG/5ML PO SOLN
650.0000 mg | ORAL | Status: DC | PRN
  Filled 2021-10-31: qty 20.3

## 2021-10-31 MED ORDER — CLOPIDOGREL BISULFATE 75 MG PO TABS
75.0000 mg | ORAL_TABLET | Freq: Every day | ORAL | Status: DC
  Administered 2021-11-01 – 2021-11-02 (×2): 75 mg via ORAL
  Filled 2021-10-31 (×2): qty 1

## 2021-10-31 MED ORDER — MIRABEGRON ER 50 MG PO TB24
50.0000 mg | ORAL_TABLET | Freq: Every day | ORAL | Status: DC
  Administered 2021-11-01 – 2021-11-05 (×5): 50 mg via ORAL
  Filled 2021-10-31 (×6): qty 1

## 2021-10-31 MED ORDER — HEPARIN BOLUS VIA INFUSION
4000.0000 [IU] | Freq: Once | INTRAVENOUS | Status: AC
  Administered 2021-10-31: 4000 [IU] via INTRAVENOUS
  Filled 2021-10-31: qty 4000

## 2021-10-31 MED ORDER — STROKE: EARLY STAGES OF RECOVERY BOOK: Freq: Once | Status: DC

## 2021-10-31 MED ORDER — CITALOPRAM HYDROBROMIDE 20 MG PO TABS
20.0000 mg | ORAL_TABLET | Freq: Every day | ORAL | Status: DC
  Administered 2021-11-01 – 2021-11-05 (×5): 20 mg via ORAL
  Filled 2021-10-31 (×5): qty 1

## 2021-10-31 MED ORDER — SODIUM CHLORIDE 0.9 % IV SOLN
500.0000 mg | INTRAVENOUS | Status: DC
  Administered 2021-10-31 – 2021-11-03 (×4): 500 mg via INTRAVENOUS
  Filled 2021-10-31 (×5): qty 500

## 2021-10-31 MED ORDER — ONDANSETRON HCL 4 MG/2ML IJ SOLN: 4.0000 mg | Freq: Three times a day (TID) | INTRAMUSCULAR | Status: DC | PRN

## 2021-10-31 MED ORDER — HEPARIN (PORCINE) 25000 UT/250ML-% IV SOLN
850.0000 [IU]/h | INTRAVENOUS | Status: DC
  Administered 2021-10-31: 17:00:00 850 [IU]/h via INTRAVENOUS
  Administered 2021-11-01: 700 [IU]/h via INTRAVENOUS
  Filled 2021-10-31 (×2): qty 250

## 2021-10-31 MED ORDER — SODIUM CHLORIDE 0.9 % IV SOLN: INTRAVENOUS | Status: DC

## 2021-10-31 MED ORDER — ASPIRIN EC 81 MG PO TBEC
81.0000 mg | DELAYED_RELEASE_TABLET | Freq: Every day | ORAL | Status: DC
  Administered 2021-11-01 – 2021-11-02 (×2): 81 mg via ORAL
  Filled 2021-10-31 (×2): qty 1

## 2021-10-31 MED ORDER — HEPARIN SODIUM (PORCINE) 5000 UNIT/ML IJ SOLN
5000.0000 [IU] | Freq: Three times a day (TID) | INTRAMUSCULAR | Status: DC
  Administered 2021-10-31: 5000 [IU] via SUBCUTANEOUS
  Filled 2021-10-31: qty 1

## 2021-10-31 MED ORDER — ACETAMINOPHEN 650 MG RE SUPP
650.0000 mg | RECTAL | Status: DC | PRN
  Filled 2021-10-31: qty 1

## 2021-10-31 MED ORDER — METRONIDAZOLE 500 MG/100ML IV SOLN
500.0000 mg | Freq: Three times a day (TID) | INTRAVENOUS | Status: DC
  Administered 2021-10-31 – 2021-11-03 (×9): 500 mg via INTRAVENOUS
  Filled 2021-10-31 (×12): qty 100

## 2021-10-31 MED ORDER — SODIUM CHLORIDE 0.9% FLUSH
3.0000 mL | Freq: Once | INTRAVENOUS | Status: AC
  Administered 2021-10-31: 3 mL via INTRAVENOUS

## 2021-10-31 MED ORDER — HYDRALAZINE HCL 20 MG/ML IJ SOLN: 5.0000 mg | INTRAMUSCULAR | Status: DC | PRN

## 2021-10-31 NOTE — ED Provider Notes (Signed)
[facility] Emergency Department Provider Note ____________________________________________   Event Date/Time   First MD Initiated Contact with Patient 10/31/21 [phone]     (approximate)  I have reviewed the triage vital signs and the nursing notes.   HISTORY  Chief Complaint Code Stroke    HPI [name] is a 75 y.o. female with history of hypertension, hyperlipidemia, chronic kidney disease, CVA, left MCA aneurysm status post coiling, dementia who presents to the emergency department with EMS as a code stroke.  Most of the history is provided by patient's husband.  He states that after her last stroke in June 2021, patient has had aphasia.  He states that normally she just has a hard time getting out correct words.  He states that he last saw her at her baseline at 8 PM last night.  He states that he woke up around 5 AM and heard her moaning and found her on the living room floor.  He states that she was not able to get up on her own and was not able to talk at all which is abnormal for her.  He states that normally she ambulates with a cane.  He is not sure if she is on any blood thinners but it does appear that she is on Plavix.  He states for the past week she has had a nonproductive cough but no fever.  No vomiting or diarrhea.  Also reports she did have a fall a few days ago but was not seen in the emergency department for that.  No new medication changes.  No history of drug or alcohol abuse.           Past Medical History:  Diagnosis Date   Anxiety    Benign neoplasm of colon 03/27/2014   Brain aneurysm 2006   Cerebrovascular disease 06/28/2014   Chronic kidney disease    Dementia, vascular ([place]) 06/19/2014   Depression    Hyperlipidemia 02/20/2019   Pure hypercholesterolemia 03/27/2014   Receptive aphasia    Sleep apnea    Stroke [facility])    Vascular dementia ([place])    Wears dentures     Patient Active Problem List   Diagnosis Date Noted    Hypertensive urgency 06/27/2020   Asymptomatic hypertensive urgency 06/27/2020   Aphasia 06/14/2020   Brain mass 06/14/2020   Stroke ([place])    CKD (chronic kidney disease), stage IIIa    Chronic systolic CHF (congestive heart failure) ([place])    Tobacco abuse    Carotid stenosis 02/26/2019   Essential hypertension 02/26/2019   PAD (peripheral artery disease) ([place]) 02/26/2019   Depression 02/20/2019   Hyperlipidemia 02/20/2019   Receptive aphasia 02/20/2019   Cerebrovascular disease 06/28/2014   Dementia, vascular ([place]) 06/19/2014   Benign neoplasm of colon 03/27/2014   Pure hypercholesterolemia 03/27/2014    Past Surgical History:  Procedure Laterality Date   CATARACT EXTRACTION W/PHACO Left 04/27/2018   Procedure: CATARACT EXTRACTION PHACO AND INTRAOCULAR LENS PLACEMENT ([place]) LEFT;  Surgeon: Leandrew Koyanagi, MD;  Location: [place];  Service: Ophthalmology;  Laterality: Left;   CATARACT EXTRACTION W/PHACO Right 06/14/2018   Procedure: CATARACT EXTRACTION PHACO AND INTRAOCULAR LENS PLACEMENT ([place])  RIGHT;  Surgeon: Leandrew Koyanagi, MD;  Location: [place];  Service: Ophthalmology;  Laterality: Right;   CEREBRAL ANEURYSM REPAIR      Prior to Admission medications   Medication Sig Start Date End Date Taking? Authorizing Provider  atorvastatin (LIPITOR) 40 MG tablet Take 1 tablet (  40 mg total) by mouth daily. 06/15/20   Ezekiel Slocumb, DO  citalopram (CELEXA) 20 MG tablet Take 20 mg by mouth daily.    [provider]  clopidogrel (PLAVIX) 75 MG tablet Take 1 tablet (75 mg total) by mouth daily. 06/16/20   Ezekiel Slocumb, DO  losartan (COZAAR) 100 MG tablet Take 100 mg by mouth daily. 06/19/20   [provider]  multivitamin-lutein (OCUVITE-LUTEIN) CAPS capsule Take 1 capsule by mouth daily.    [provider]  MYRBETRIQ 50 MG TB24 tablet Take 1 tablet by mouth once daily 05/19/21   Billey Co, MD  Vitamin D, Ergocalciferol,  (DRISDOL) 50000 units CAPS capsule Take 50,000 Units by mouth once a week.  03/15/18   [provider]    Allergies Patient has no known allergies.  Family History  Problem Relation Age of Onset   Dementia Mother    Heart attack Brother     Social History Social History   Tobacco Use   Smoking status: Former    Packs/day: 1.00    Types: Cigarettes   Smokeless tobacco: Never  Vaping Use   Vaping Use: Never used  Substance Use Topics   Alcohol use: Not Currently   Drug use: Never    Review of Systems Level 5 caveat secondary to aphasia   ____________________________________________   PHYSICAL EXAM:  VITAL SIGNS: Vitals:   10/31/21 0620  BP: (!) 135/97  Pulse: 93  Resp: (!) 22  Temp: 98.7 F (37.1 C)  SpO2: 94%    CONSTITUTIONAL: Alert.  Elderly.  Unable to answer questions.  Will follow some commands intermittently. HEAD: Normocephalic; atraumatic EYES: Conjunctivae clear, PERRL, EOMI ENT: normal nose; no rhinorrhea; moist mucous membranes; pharynx without lesions noted; no dental injury; no septal hematoma NECK: Supple, no meningismus, no LAD; no midline spinal tenderness, step-off or deformity; trachea midline CARD: RRR; S1 and S2 appreciated; no murmurs, no clicks, no rubs, no gallops RESP: Normal chest excursion without splinting or tachypnea; breath sounds clear and equal bilaterally; no wheezes, no rhonchi, no rales; no hypoxia or respiratory distress CHEST:  chest wall stable, no crepitus or ecchymosis or deformity, nontender to palpation; no flail chest ABD/GI: Normal bowel sounds; non-distended; soft, non-tender, no rebound, no guarding; no ecchymosis or other lesions noted PELVIS:  stable, nontender to palpation BACK:  The back appears normal and is non-tender to palpation, there is no CVA tenderness; no midline spinal tenderness, step-off or deformity EXT: Normal ROM in all joints; non-tender to palpation; no edema; normal capillary refill; no  cyanosis, no bony tenderness or bony deformity of patient's extremities, no joint effusion, compartments are soft, extremities are warm and well-perfused, no ecchymosis SKIN: Normal color for age and race; warm NEURO: No pronator drift of her bilateral upper extremities.  She will not lift either leg off of the bed and cannot hold them up against gravity.  She is aphasic.  Unable to test for facial asymmetry as patient able to follow all commands.  Unable to test sensation or visual fields. PSYCH: The patient's mood and manner are appropriate. Grooming and personal hygiene are appropriate.  ____________________________________________   LABS (all labs ordered are listed, but only abnormal results are displayed)  Labs Reviewed  CBG MONITORING, ED - Abnormal; Notable for the following components:      Result Value   Glucose-Capillary 127 (*)    All other components within normal limits  RESP PANEL BY RT-PCR (FLU A&B, COVID) ARPGX2  PROTIME-INR  APTT  CBC  DIFFERENTIAL  COMPREHENSIVE METABOLIC PANEL  URINALYSIS, COMPLETE (UACMP) WITH MICROSCOPIC  TSH  CK   ____________________________________________  EKG   EKG Interpretation  Date/Time:  Friday October 31 2021 06:45:20 EST Ventricular Rate:  92 PR Interval:  184 QRS Duration: 76 QT Interval:  362 QTC Calculation: 447 R Axis:   5 Text Interpretation: Normal sinus rhythm ST & T wave abnormality, consider lateral ischemia Abnormal ECG Confirmed by Pryor Curia [phone]) on 10/31/2021 7:04:47 AM        ____________________________________________  RADIOLOGY Jessie Foot Dayvon Dax, personally viewed and evaluated these images (plain radiographs) as part of my medical decision making, as well as reviewing the written report by the radiologist.  ED MD interpretation: Head CT shows no acute abnormality.  Official radiology report(s): CT HEAD CODE STROKE WO CONTRAST  Result Date: 10/31/2021 CLINICAL DATA:  Code stroke.   75 year old female with aphasia. EXAM: CT HEAD WITHOUT CONTRAST TECHNIQUE: Contiguous axial images were obtained from the base of the skull through the vertex without intravenous contrast. COMPARISON:  Recent brain MRI 10/15/2021.  Head CT 06/27/2020. 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 Fairly extensive chronic encephalomalacia in the left hemisphere involving both the MCA and PCA territories. Superimposed advanced bilateral cerebral white matter hypodensity. Small area of chronic encephalomalacia in the anterior right MCA territory. Advanced small vessel disease throughout the bilateral deep gray nuclei. And chronic small vessel disease also in the brainstem and bilateral cerebellar hemispheres. Ex vacuo ventricular enlargement is stable from earlier this month. No superimposed midline shift, mass effect, intracranial hemorrhage or evidence of cortically based acute infarction. 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 Vascular: Calcified atherosclerosis at the skull base. 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 No suspicious intracranial vascular hyperdensity. Skull: No acute osseous abnormality identified. Sinuses/Orbits: Chronic sinusitis with scattered mucoperiosteal thickening has not significantly changed from last year. Mild right mastoid effusion not significantly changed. Other: No acute orbit or scalp soft tissue finding. ASPECTS [facility] Stroke Program Early CT Score) Total score (0-10 with 10 being normal): 10 (chronic encephalomalacia) IMPRESSION: 1. Severe chronic ischemic disease. No acute cortically based infarct or acute intracranial hemorrhage identified. 2. Previous left MCA aneurysm coil embolization. Study discussed by telephone with Dr. Pryor Curia on 10/31/2021 at 06:36 . Electronically Signed   By: Genevie Ann M.D.   On: 10/31/2021 06:37     ____________________________________________   PROCEDURES  Procedure(s) performed (including Critical Care):  Procedures  ____________________________________________   INITIAL IMPRESSION / ASSESSMENT AND PLAN / ED COURSE  As part of my medical decision making, I reviewed the following data within the [place] History obtained from family, Nursing notes reviewed and incorporated, Labs reviewed , EKG interpreted , Old EKG reviewed, Old chart reviewed, Patient signed out to oncoming ED physician, Radiograph reviewed , A consult was requested and obtained from this/these consultant(s) neurology, CT reviewed, and Notes from prior ED visits         Patient here with aphasia.  Found down by her husband and unable to get off the floor.  It appears this is very different than her baseline.  Was last seen at her baseline around 8 PM last night.  Found this morning around 5 AM.  Code stroke called by EMS.  Patient evaluated emergently by tele neurology.  At this time patient is not a candidate for TNKase.  Patient has history of chronic  kidney disease and cannot obtain a CTA of her head and neck.  CT head shows severe bilateral microvascular disease but no acute abnormality, no hemorrhage.  Discussed with husband that this is concerning for possible new stroke but could also be an exacerbation of previous stroke symptoms from other etiologies such as infection, dehydration, electrolyte derangements.  Blood glucose today is normal.  She has had a cough for the past week.  Will obtain chest x-ray, COVID and flu swabs.  Will obtain labs, urine.  Patient will need admission to the hospital.  ED PROGRESS  Labs, x-ray pending.  Signed out the oncoming ED physician.    I reviewed all nursing notes and pertinent previous records as available.  I have reviewed and interpreted any EKGs, lab and urine results, imaging (as  available).    ____________________________________________   FINAL CLINICAL IMPRESSION(S) / ED DIAGNOSES  Final diagnoses:  Aphasia  Altered mental status, unspecified altered mental status type  Fall, initial encounter     ED Discharge Orders     None       *Please note:  INDONESIA Linda [name] was evaluated in Emergency Department on 10/31/2021 for the symptoms described in the history of present illness. She was evaluated in the context of the global COVID-19 pandemic, which necessitated consideration that the patient might be at risk for infection with the SARS-CoV-2 virus that causes COVID-19. Institutional protocols and algorithms that pertain to the evaluation of patients at risk for COVID-19 are in a state of rapid change based on information released by regulatory bodies including the CDC and federal and state organizations. These policies and algorithms were followed during the patient's care in the ED.  Some ED evaluations and interventions may be delayed as a result of limited staffing during and the pandemic.*   Note:  This document was prepared using Dragon voice recognition software and may include unintentional dictation errors.    Leanore Biggers, Delice Bison, DO 10/31/21 [phone]

## 2021-10-31 NOTE — ED Notes (Signed)
Pt to ct at [phone]

## 2021-10-31 NOTE — Progress Notes (Signed)
*  PRELIMINARY RESULTS* Echocardiogram 2D Echocardiogram has been performed.  Linda [name] 10/31/2021, 1:31 PM

## 2021-10-31 NOTE — ED Triage Notes (Signed)
Pt presents via acems with c/o aphasia. Per husband, pt last known normal was 9pm last night. When husband went to bed, patient was reported to be normal, but when husband awoke patient could not get words out. Pt husband reports patient has hx of stroke. Patient alert but still with aphasia upon arrival to emergency room. Pt taken straight to CT upon arrival.

## 2021-10-31 NOTE — ED Notes (Signed)
Pt in bed, pt appears to be coughing/choking, suctioned pt, pt expelled a small amount of brown liquid, pt now satting 84% on room air, resps even and unlabored, pt placed on 4L via [place] and O2 sat returned to mid/upper 90s, pt now satting 98% on 4L, suspected to pt may have aspirated, sat head of pt's bed up, suction at bedside, MD notified.  Pt remains on O2 and cardiac monitor. Family at bedside

## 2021-10-31 NOTE — ED Notes (Signed)
Spouse report LNK @ 2000. Spouse wake up at 0500 and find her on the floor.

## 2021-10-31 NOTE — ED Notes (Signed)
Pt noticed to have a weak cough.

## 2021-10-31 NOTE — ED Notes (Signed)
In and out cath complete, pt had soft brown bm, cleaned pt, changed bedding and gown, pt tolerated well, pt remains incomprehensible speech.  Pt moving all extremities, weakness on R.

## 2021-10-31 NOTE — ED Notes (Signed)
Ekg complete and MD Niu notified that it was done and exported, copy shown to EDP, pt possible has st elevations on ekg, MD Niu notified.

## 2021-10-31 NOTE — ED Notes (Signed)
Pt in MRI at this time 

## 2021-10-31 NOTE — Consult Note (Signed)
[facility] TeleNeurology Consult Services   Patient Name:   Linda [name], Linda [name] Date of Birth:   1946-01-18 Identification Number:   MRN - [id number] Date of Service:   10/31/2021 06:31:21  Diagnosis:       R41.82 - AMS (Altered Mental Status)  Impression:      Linda [name] is a 75 year-old woman with a PMH of HTN, CKD, L MCA Infarct, Vascular Dementia with a baseline expressive aphasia who was BIBEMS for altered mental status. tPA was not given as she is outside the window.  Metrics: Last Known Well: 10/30/2021 20:00:00 [facility] Notification Time: 10/31/2021 06:31:21 Arrival Time: 10/31/2021 06:17:00 Stamp Time: 10/31/2021 06:31:21 Initial Response Time: 10/31/2021 06:35:08 Symptoms: Altered mental status. NIHSS Start Assessment Time: 10/31/2021 06:42:00 Patient is not a candidate for Thrombolytic. Thrombolytic Medical Decision: 10/31/2021 06:35:57 Patient was not deemed candidate for Thrombolytic because of following reasons: Last Well Known Above 4.5 Hours.  CT head showed no acute hemorrhage or acute core infarct. I personally Reviewed the CT Head and it Showed severe chronic microvascular ischemic changes  ED Physician notified of diagnostic impression and management plan on 10/31/2021 07:03:04  Advanced Imaging: Advanced Imaging Not Completed because:  LVO is not suspected   Our recommendations are outlined below.  Recommendations:        Stroke/Telemetry Floor       Neuro Checks       Bedside Swallow Eval       DVT Prophylaxis       IV Fluids, Normal Saline       Head of Bed 30 Degrees       Euglycemia and Avoid Hyperthermia (PRN Acetaminophen)       Antihypertensives PRN if Blood pressure is greater than 220/120 or there is a concern for End organ damage/contraindications for permissive HTN. If blood pressure is greater than 220/120 give labetalol PO or IV or Vasotec IV with a goal of 15% reduction in BP during the first 24 hours.       -  MRI Brain without contrast       - Toxic/Metabolic work up as per primary team       - Carotid Dopplers       - TTE       - PT/OT/ST where applicable       - Labs: lipid panel; HgbA1c  Routine Consultation with [facility] for Follow up Care  Sign Out:       Discussed with Emergency Department Provider    ------------------------------------------------------------------------------  History of Present Illness: Patient is a 75 year old Female.  Patient was brought by EMS for symptoms of Altered mental status.  Linda [name] is a 75 year-old woman with a PMH of HTN, CKD, L MCA Infarct, Vascular Dementia with a baseline expressive aphasia who ambulates with a cane and walker who was BIBEMS for altered mental status. Her husband is at the bedside. Her husband reports that he went to bed before her last night and when he woke up this morning he found her down on the floor. He states that at her baseline Linda [name] is able to produce some words but this morning she is non verbal and that is the only difference he sees in her. Her husband does report that she fell one weak ago and states that she has had a cough for the last week but denies any sick contacts.    Past Medical History:      Hypertension  Stroke  No Anticoagulant use   Antiplatelet use: Yes Plavix  Allergies:  NKDA  NIHSS may not be reliable due to: Generalized weakness  Examination: BP(135/97), Pulse(92), 1A: Level of Consciousness - Alert; keenly responsive + 0 1B: Ask Month and Age - Aphasic + 2 1C: Blink Eyes & Squeeze Hands - Performs Both Tasks + 0 2: Test Horizontal Extraocular Movements - Normal + 0 3: Test Visual Fields - No Visual Loss + 0 4: Test Facial Palsy (Use Grimace if Obtunded) - Normal symmetry + 0 5A: Test Left Arm Motor Drift - Some Effort Against Gravity + 2 5B: Test Right Arm Motor Drift - Some Effort Against Gravity + 2 6A: Test Left Leg Motor Drift - No Effort Against Gravity +  3 6B: Test Right Leg Motor Drift - No Effort Against Gravity + 3 7: Test Limb Ataxia (FNF/Heel-Shin) - No Ataxia + 0 8: Test Sensation - Normal; No sensory loss + 0 9: Test Language/Aphasia - Mute/Global Aphasia: No Usable Speech/Auditory Comprehension + 3 10: Test Dysarthria - Normal + 0 11: Test Extinction/Inattention - No abnormality + 0  NIHSS Score: 15   Pre-Morbid Modified Rankin Scale: 4 Points = Moderately severe disability; unable to walk and attend to bodily needs without assistance   Patient/Family was informed the Neurology Consult would occur via TeleHealth consult by way of interactive audio and video telecommunications and consented to receiving care in this manner.   Patient is being evaluated for possible acute neurologic impairment and high probability of imminent or life-threatening deterioration. I spent total of 30 minutes providing care to this patient, including time for face to face visit via telemedicine, review of medical records, imaging studies and discussion of findings with providers, the patient and/or family.   Dr Asa Saunas   [facility] [phone]  Case [id number]

## 2021-10-31 NOTE — ED Notes (Addendum)
Pt adjusted in bed and pillow given. Pt noted not to be in any distress. Pt is able to track me across room and answer yes/no question. Lines and drains check and in good order. Pt was given swallow screen with 46ml of water and passed. Pt noted to expiatory wheezing. Air way patent

## 2021-10-31 NOTE — ED Notes (Signed)
Ultrasound at bedside

## 2021-10-31 NOTE — Consult Note (Signed)
Linda [name]  [name] CONSULT NOTE  Patient ID: Linda [name] MRN: [id number] DOB/AGE: Dec 18, 1945 75 y.o.  Admit date: 10/31/2021 Referring Physician Ivor Costa Primary Physician Feldpausch, Chrissie Noa, MD Primary Cardiologist None Reason for Consultation NSTEMI  HPI:  Linda [name] is a 75 year old female with history of CVA with residual aphasia, hypertension, hyperlipidemia, dementia, CKD 4, chronic diastolic heart failure who was admitted with worse facial droop and difficulty walking.  [name] is consulted because the troponin is elevated to 7000 and echocardiogram shows a hypokinetic anterior wall and apex.  The patient was unable to provide history because of mental status, so history is provided by the daughter who is at bedside.  Apparently she developed worsening aphasia on 10/30/2021 around 9 PM and increased difficulty walking.  She is also diffusely weak but was unable to walk at all.  Apparently she was then found on the bathroom floor at 5 AM this morning and was down for an unknown period of time.  They presented to the emergency department at which time she had no chest pain or shortness of breath.  Shortly after arrival she had an episode of aspiration in which her oxygen requirement went up significantly to 4 L with respiratory distress.  Since arrival, vital signs of been relatively stable.  She has been afebrile with a heart rate in the 80s to 90s.  Blood pressure is 140s over 80s.  Labs are notable for a WBC count of 14.4, hemoglobin 10.5, creatinine of 2.95, procalcitonin 0.79  Review of systems complete and found to be negative unless listed above     Past Medical History:  Diagnosis Date   Anxiety    Benign neoplasm of colon 03/27/2014   Brain aneurysm 2006   Cerebrovascular disease 06/28/2014   Chronic kidney disease    Dementia, vascular ([place]) 06/19/2014   Depression    Hyperlipidemia 02/20/2019   Pure hypercholesterolemia 03/27/2014   Receptive aphasia     Sleep apnea    Stroke [facility])    Vascular dementia ([place])    Wears dentures     Past Surgical History:  Procedure Laterality Date   CATARACT EXTRACTION W/PHACO Left 04/27/2018   Procedure: CATARACT EXTRACTION PHACO AND INTRAOCULAR LENS PLACEMENT ([place]) LEFT;  Surgeon: Leandrew Koyanagi, MD;  Location: [place];  Service: Ophthalmology;  Laterality: Left;   CATARACT EXTRACTION W/PHACO Right 06/14/2018   Procedure: CATARACT EXTRACTION PHACO AND INTRAOCULAR LENS PLACEMENT ([place])  RIGHT;  Surgeon: Leandrew Koyanagi, MD;  Location: [place];  Service: Ophthalmology;  Laterality: Right;   CEREBRAL ANEURYSM REPAIR      (Not in a hospital admission)  Social History   Socioeconomic History   Marital status: Married    Spouse name: Not on file   Number of children: Not on file   Years of education: Not on file   Highest education level: Not on file  Occupational History   Not on file  Tobacco Use   Smoking status: Former    Packs/day: 1.00    Types: Cigarettes   Smokeless tobacco: Never  Vaping Use   Vaping Use: Never used  Substance and Sexual Activity   Alcohol use: Not Currently   Drug use: Never   Sexual activity: Not on file  Other Topics Concern   Not on file  Social History Narrative   Not on file   Social Determinants of Health   Financial Resource Strain: Not on file  Food Insecurity: Not on file  Transportation Needs: Not  on file  Physical Activity: Not on file  Stress: Not on file  Social Connections: Not on file  Intimate Partner Violence: Not on file    Family History  Problem Relation Age of Onset   Dementia Mother    Heart attack Brother       Review of systems complete and found to be negative unless listed above      PHYSICAL EXAM  General: Well developed, well nourished, in no acute distress HEENT:  Normocephalic and atramatic Neck:  No JVD.  Lungs: Clear bilaterally to auscultation and percussion. Heart: HRRR . Normal  S1 and S2 without gallops or murmurs.  Abdomen: Bowel sounds are positive, abdomen soft and non-tender  Msk:  Back normal, normal gait. Normal strength and tone for age. Extremities: No clubbing, cyanosis or edema.   Neuro: Alert and oriented X 3. Psych:  Good affect, responds appropriately  Labs:   Lab Results  Component Value Date   WBC 14.4 (H) 10/31/2021   HGB 10.5 (L) 10/31/2021   HCT 33.0 (L) 10/31/2021   MCV 99.1 10/31/2021   PLT 291 10/31/2021    Recent Labs  Lab 10/31/21 0648  NA 136  K 5.1  CL 106  CO2 20*  BUN 52*  CREATININE 2.94*  CALCIUM 9.2  PROT 8.2*  BILITOT 0.5  ALKPHOS 48  ALT 23  AST 59*  GLUCOSE 118*   Lab Results  Component Value Date   CKTOTAL 2,335 (H) 10/31/2021   TROPONINI < 0.02 03/17/2012    Lab Results  Component Value Date   CHOL 138 06/28/2020   CHOL 136 06/15/2020   Lab Results  Component Value Date   HDL 53 06/28/2020   HDL 40 (L) 06/15/2020   Lab Results  Component Value Date   LDLCALC 73 06/28/2020   LDLCALC 76 06/15/2020   Lab Results  Component Value Date   TRIG 58 06/28/2020   TRIG 99 06/15/2020   Lab Results  Component Value Date   CHOLHDL 2.6 06/28/2020   CHOLHDL 3.4 06/15/2020   No results found for: LDLDIRECT    Radiology: MR ANGIO HEAD WO CONTRAST  Result Date: 10/31/2021 CLINICAL DATA:  Neuro deficit, acute, stroke suspected EXAM: MRI HEAD WITHOUT CONTRAST MRA HEAD WITHOUT CONTRAST TECHNIQUE: Multiplanar, multi-echo pulse sequences of the brain and surrounding structures were acquired without intravenous contrast. Angiographic images of the Circle of Willis were acquired using MRA technique without intravenous contrast. COMPARISON:  10/15/2021 FINDINGS: MRI HEAD FINDINGS Brain: No acute infarction or hemorrhage. Encephalomalacia and gliosis in the left cerebral hemisphere with involvement of frontal, parietal, temporal, and occipital lobes. Associated chronic blood products particularly in the temporal  lobe. Ex vacuo dilatation of the left lateral ventricle. Chronic small vessel infarcts of the central gray nuclei, central white matter, pons, and cerebellum. Few additional scattered foci of susceptibility likely reflecting chronic microhemorrhages. Other stable confluent areas of T2 hyperintensity in the supratentorial and pontine white matter are nonspecific but probably reflect advanced chronic microvascular ischemic changes. Prominence of the ventricles and sulci reflects parenchymal volume loss. Vascular: Susceptibility artifact related to left MCA aneurysm coiling. Skull and upper cervical spine: Marrow signal is within normal limits. Sinuses/Orbits: Chronic left sphenoid sinusitis. Bilateral lens replacements. Other: Suprasellar mass along pituitary infundibulum is not well evaluated on this study. Persistent bilateral mastoid effusions. MRA HEAD FINDINGS Anterior circulation: Intracranial internal carotid arteries are patent with atherosclerotic irregularity. Anterior cerebral arteries are patent. Middle cerebral arteries are patent. There is susceptibility artifact  at site of left MCA bifurcation aneurysm coiling. No evidence of recurrence. Posterior circulation: Intracranial vertebral arteries are patent. The right vertebral artery terminates as a PICA. Basilar artery is patent. Posterior cerebral arteries are patent. Bilateral posterior communicating arteries are present. Fetal origin of the left PCA. IMPRESSION: No acute infarction or other acute abnormality. Stable chronic findings compared to recent prior study. No significant vascular abnormality. 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 Electronically Signed   By: Macy Mis M.D.   On: 10/31/2021 12:42   MR BRAIN WO CONTRAST  Result Date: 10/31/2021 CLINICAL DATA:  Neuro deficit, acute, stroke suspected EXAM: MRI HEAD WITHOUT CONTRAST MRA HEAD WITHOUT CONTRAST TECHNIQUE: Multiplanar, multi-echo pulse  sequences of the brain and surrounding structures were acquired without intravenous contrast. Angiographic images of the Circle of Willis were acquired using MRA technique without intravenous contrast. COMPARISON:  10/15/2021 FINDINGS: MRI HEAD FINDINGS Brain: No acute infarction or hemorrhage. Encephalomalacia and gliosis in the left cerebral hemisphere with involvement of frontal, parietal, temporal, and occipital lobes. Associated chronic blood products particularly in the temporal lobe. Ex vacuo dilatation of the left lateral ventricle. Chronic small vessel infarcts of the central gray nuclei, central white matter, pons, and cerebellum. Few additional scattered foci of susceptibility likely reflecting chronic microhemorrhages. Other stable confluent areas of T2 hyperintensity in the supratentorial and pontine white matter are nonspecific but probably reflect advanced chronic microvascular ischemic changes. Prominence of the ventricles and sulci reflects parenchymal volume loss. Vascular: Susceptibility artifact related to left MCA aneurysm coiling. Skull and upper cervical spine: Marrow signal is within normal limits. Sinuses/Orbits: Chronic left sphenoid sinusitis. Bilateral lens replacements. Other: Suprasellar mass along pituitary infundibulum is not well evaluated on this study. Persistent bilateral mastoid effusions. MRA HEAD FINDINGS Anterior circulation: Intracranial internal carotid arteries are patent with atherosclerotic irregularity. Anterior cerebral arteries are patent. Middle cerebral arteries are patent. There is susceptibility artifact at site of left MCA bifurcation aneurysm coiling. No evidence of recurrence. Posterior circulation: Intracranial vertebral arteries are patent. The right vertebral artery terminates as a PICA. Basilar artery is patent. Posterior cerebral arteries are patent. Bilateral posterior communicating arteries are present. Fetal origin of the left PCA. IMPRESSION: No acute  infarction or other acute abnormality. Stable chronic findings compared to recent prior study. No significant vascular abnormality. 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 Electronically Signed   By: Macy Mis M.D.   On: 10/31/2021 12:42   MR BRAIN W WO CONTRAST  Result Date: 10/15/2021 CLINICAL DATA:  Follow-up suprasellar mass. EXAM: MRI HEAD WITHOUT AND WITH CONTRAST TECHNIQUE: Multiplanar, multiecho pulse sequences of the brain and surrounding structures were obtained without and with intravenous contrast. 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 This is in the midline and is in the area of the infundibulum. This is just above the pituitary but appears separate from the pituitary which otherwise appears normal. Generalized atrophy with ventricular enlargement which is similar to the prior study. There is encephalomalacia and chronic infarction in the left frontal and temporal lobe. Chronic blood products are present in the left temporal infarct. Prior aneurysm coiling in the left MCA. Advanced chronic microvascular ischemic changes in the white matter. Negative for acute infarct. Vascular: Normal arterial flow voids. Aneurysm coiling left MCA bifurcation region. Skull and upper cervical spine: No focal lesion. Sinuses/Orbits:  Mucosal edema in the paranasal sinuses. Bilateral mastoid effusion. 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 This is just above the pituitary and appears separate from the pituitary. Extensive atrophy. Extensive chronic microvascular ischemic change. Chronic left MCA infarct. Left MCA bifurcation aneurysm coiling. Electronically Signed   By: Franchot Gallo M.D.   On: 10/15/2021 16:05   US Carotid Bilateral  (at [facility] and [facility] only)  Result Date: 10/31/2021 CLINICAL DATA:  Stroke, hyperlipidemia, former smoker EXAM: BILATERAL CAROTID DUPLEX ULTRASOUND TECHNIQUE: Pearline Cables scale imaging, color Doppler and duplex ultrasound were performed of bilateral carotid and vertebral arteries in the neck. COMPARISON:  None. FINDINGS: Criteria: Quantification of carotid stenosis is based on velocity parameters that correlate the residual internal carotid diameter with NASCET-based stenosis levels, using the diameter of the distal internal carotid lumen as the denominator for stenosis measurement. The following velocity measurements were obtained: RIGHT ICA: 71/27 cm/sec CCA: 47/42 cm/sec SYSTOLIC ICA/CCA RATIO:  1.0 ECA: 47 cm/sec LEFT ICA: 127/36 cm/sec CCA: 59/56 cm/sec SYSTOLIC ICA/CCA RATIO:  2.8 ECA: 46 cm/sec RIGHT CAROTID ARTERY: Moderate mixed echogenicity carotid bifurcation atherosclerosis. Despite this, no significant ICA stenosis, velocity elevation, turbulent flow. Degree of narrowing less than 50% by ultrasound criteria. RIGHT VERTEBRAL ARTERY:  Normal antegrade flow LEFT CAROTID ARTERY: Similar moderate mixed echogenicity irregular bifurcation atherosclerosis. Negative for significant stenosis, velocity elevation, turbulent flow. Degree of narrowing also less than 50% by ultrasound criteria. LEFT VERTEBRAL ARTERY:  Normal antegrade flow IMPRESSION: Bilateral carotid atherosclerosis. Negative for significant ICA stenosis. Degree of narrowing less than 50% bilaterally by ultrasound criteria. Patent antegrade vertebral flow bilaterally Electronically Signed   By: Jerilynn Mages.  Shick M.D.   On: 10/31/2021 14:25   DG Chest Portable 1 View  Result Date: 10/31/2021 CLINICAL DATA:  75 year old female code stroke presentation. Cough for 1 week. EXAM: PORTABLE CHEST 1 VIEW COMPARISON:  Chest radiographs 03/17/2012. FINDINGS: Portable AP upright view at 0652 hours. Chronic large lung volumes. Mild cardiomegaly appears new since 2013. Other  mediastinal contours are within normal limits. Visualized tracheal air column is within normal limits. Chronic but increased and coarse bilateral pulmonary interstitial opacity, fairly symmetric and with some basilar predominance. No pneumothorax, pleural effusion or confluent opacity. No acute osseous abnormality identified. Negative visible bowel gas. IMPRESSION: Chronic hyperinflation with acute on chronic bilateral pulmonary interstitial opacity. Top differential considerations include viral/atypical respiratory infection, pulmonary edema, progressed chronic lung disease. Mild cardiomegaly.  No pleural effusion. Electronically Signed   By: Genevie Ann M.D.   On: 10/31/2021 07:14   ECHOCARDIOGRAM COMPLETE  Result Date: 10/31/2021    ECHOCARDIOGRAM REPORT   Patient Name:   ALLE DIFABIO Date of Exam: 10/31/2021 Medical Rec #:  [id number]      Height:       66.0 in Accession #:    [id number]     Weight:       160.3 lb Date of Birth:  04-Mar-1946      BSA:          1.820 m Patient Age:    44 years       BP:           145/87 mmHg Patient Gender: F              HR:           85 bpm. Exam Location:  [facility] Procedure: 2D Echo, Cardiac Doppler and Color Doppler Indications:  Stroke I63.9  History:         Patient has no prior history of Echocardiogram examinations.                  Stroke; Risk Factors:Dyslipidemia. CKD.  Sonographer:     Sherrie Sport Referring Phys:  Unknown Foley NIU Diagnosing Phys: Donnelly Angelica  Sonographer Comments: Suboptimal apical window. IMPRESSIONS  1. Left ventricular ejection fraction, by estimation, is 30 to 35%. The left ventricle has moderately decreased function. The left ventricle demonstrates regional wall motion abnormalities (see scoring diagram/findings for description). Left ventricular  diastolic parameters are consistent with Grade I diastolic dysfunction (impaired relaxation).  2. Right ventricular systolic function is normal. The right ventricular size is normal.  3. The mitral valve  is normal in structure. Mild mitral valve regurgitation. No evidence of mitral stenosis.  4. The aortic valve was not well visualized. Aortic valve regurgitation is not visualized. No aortic stenosis is present. 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 FINDINGS  Left Ventricle: Left ventricular ejection fraction, by estimation, is 30 to 35%. The left ventricle has moderately decreased function. The left ventricle demonstrates regional wall motion abnormalities. The left ventricular internal cavity size was normal in size. There is no left ventricular hypertrophy. Left ventricular diastolic parameters are consistent with Grade I diastolic dysfunction (impaired relaxation).  LV Wall Scoring: The entire septum is akinetic. The basal anterior segment is hypokinetic. Right Ventricle: The right ventricular size is normal. No increase in right ventricular wall thickness. Right ventricular systolic function is normal. Left Atrium: Left atrial size was normal in size. Right Atrium: Right atrial size was normal in size. Pericardium: There is no evidence of pericardial effusion. Mitral Valve: The mitral valve is normal in structure. Mild mitral valve regurgitation. No evidence of mitral valve stenosis. MV peak gradient, 5.9 mmHg. The mean mitral valve gradient is 2.0 mmHg. Tricuspid Valve: The tricuspid valve is normal in structure. Tricuspid valve regurgitation is mild. Aortic Valve: The aortic valve was not well visualized. Aortic valve regurgitation is not visualized. No aortic stenosis is present. Aortic valve mean gradient measures 1.5 mmHg. Aortic valve peak gradient measures 3.3 mmHg. Aortic valve area, by VTI measures 4.42 cm. Pulmonic Valve: The pulmonic valve was not well visualized. Pulmonic valve regurgitation is not visualized. Aorta: The aortic root was not well visualized. IAS/Shunts: The interatrial septum was not assessed.  LEFT VENTRICLE PLAX 2D  LVIDd:         4.00 cm     Diastology LVIDs:         3.40 cm     LV e' medial:    3.92 cm/s LV PW:         1.20 cm     LV E/e' medial:  12.6 LV IVS:        0.80 cm     LV e' lateral:   5.00 cm/s LVOT diam:     2.10 cm     LV E/e' lateral: 9.9 LV SV:         63 LV SV Index:   35 LVOT Area:     3.46 cm  LV Volumes (MOD) LV vol d, MOD A2C: 84.5 ml LV vol d, MOD A4C: 64.1 ml LV vol s, MOD A2C: 71.1 ml LV vol s, MOD A4C: 34.9 ml LV SV MOD A2C:     13.4 ml LV SV MOD A4C:     64.1 ml LV SV MOD BP:  24.6 ml RIGHT VENTRICLE RV Basal diam:  3.50 cm RV S prime:     10.30 cm/s TAPSE (M-mode): 3.4 cm LEFT ATRIUM             Index        RIGHT ATRIUM           Index LA diam:        3.00 cm 1.65 cm/m   RA Area:     10.40 cm LA Vol (A2C):   48.2 ml 26.48 ml/m  RA Volume:   21.10 ml  11.59 ml/m LA Vol (A4C):   67.4 ml 37.03 ml/m LA Biplane Vol: 62.3 ml 34.23 ml/m  AORTIC VALVE                    PULMONIC VALVE AV Area (Vmax):    3.78 cm     PV Vmax:        0.59 m/s AV Area (Vmean):   3.81 cm     PV Vmean:       38.300 cm/s AV Area (VTI):     4.42 cm     PV VTI:         0.083 m AV Vmax:           90.35 cm/s   PV Peak grad:   1.4 mmHg AV Vmean:          59.950 cm/s  PV Mean grad:   1.0 mmHg AV VTI:            0.144 m      RVOT Peak grad: 2 mmHg AV Peak Grad:      3.3 mmHg AV Mean Grad:      1.5 mmHg LVOT Vmax:         98.60 cm/s LVOT Vmean:        66.000 cm/s LVOT VTI:          0.183 m LVOT/AV VTI ratio: 1.28  AORTA Ao Root diam: 2.70 cm MITRAL VALVE               TRICUSPID VALVE MV Area (PHT): 5.34 cm    TR Peak grad:   43.8 mmHg MV Area VTI:   3.88 cm    TR Vmax:        331.00 cm/s MV Peak grad:  5.9 mmHg MV Mean grad:  2.0 mmHg    SHUNTS MV Vmax:       1.21 m/s    Systemic VTI:  0.18 m MV Vmean:      70.4 cm/s   Systemic Diam: 2.10 cm MV Decel Time: 142 msec    Pulmonic VTI:  0.116 m MV E velocity: 49.30 cm/s MV A velocity: 84.80 cm/s MV E/A ratio:  0.58 Donnelly Angelica Electronically signed by Donnelly Angelica Signature  Date/Time: 10/31/2021/3:14:43 PM    Final    CT HEAD CODE STROKE WO CONTRAST  Result Date: 10/31/2021 CLINICAL DATA:  Code stroke.  75 year old female with aphasia. EXAM: CT HEAD WITHOUT CONTRAST TECHNIQUE: Contiguous axial images were obtained from the base of the skull through the vertex without intravenous contrast. COMPARISON:  Recent brain MRI 10/15/2021.  Head CT 06/27/2020. 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 Fairly extensive chronic encephalomalacia in the left hemisphere involving both the MCA and PCA territories. Superimposed advanced bilateral cerebral white matter hypodensity. Small area of chronic encephalomalacia in the anterior right MCA territory. Advanced small vessel disease throughout the  bilateral deep gray nuclei. And chronic small vessel disease also in the brainstem and bilateral cerebellar hemispheres. Ex vacuo ventricular enlargement is stable from earlier this month. No superimposed midline shift, mass effect, intracranial hemorrhage or evidence of cortically based acute infarction. 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 Vascular: Calcified atherosclerosis at the skull base. 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 No suspicious intracranial vascular hyperdensity. Skull: No acute osseous abnormality identified. Sinuses/Orbits: Chronic sinusitis with scattered mucoperiosteal thickening has not significantly changed from last year. Mild right mastoid effusion not significantly changed. Other: No acute orbit or scalp soft tissue finding. ASPECTS [facility] Stroke Program Early CT Score) Total score (0-10 with 10 being normal): 10 (chronic encephalomalacia) IMPRESSION: 1. Severe chronic ischemic disease. No acute cortically based infarct or acute intracranial hemorrhage identified. 2. Previous left MCA aneurysm coil embolization. Study discussed by telephone with Dr. Pryor Curia on  10/31/2021 at 06:36 . Electronically Signed   By: Genevie Ann M.D.   On: 10/31/2021 06:37    EKG: Sinus rhythm with nonspecific ST and T wave abnormalities; there is 1 mm of ST elevation in lead V4 but isolated.  Echo 10/31/21- 1. Left ventricular ejection fraction, by estimation, is 30 to 35%. The  left ventricle has moderately decreased function. The left ventricle  demonstrates regional wall motion abnormalities (see scoring  diagram/findings for description). Left ventricular   diastolic parameters are consistent with Grade I diastolic dysfunction  (impaired relaxation).   2. Right ventricular systolic function is normal. The right ventricular  size is normal.   3. The mitral valve is normal in structure. Mild mitral valve  regurgitation. No evidence of mitral stenosis.   4. The aortic valve was not well visualized. Aortic valve regurgitation  is not visualized. No aortic stenosis is present.   ASSESSMENT AND PLAN:  Alexxa Sabet is a 75 year old female with history of CVA with residual aphasia, hypertension, hyperlipidemia, dementia, CKD 4, chronic diastolic heart failure who was admitted with worse facial droop and difficulty walking.  [name] is consulted because the troponin is elevated to 7000 and echocardiogram shows a hypokinetic anterior wall and apex.  # Elevated troponin # Apical/ anterior akinesis The patient is admitted with speech abnormality and weakness.  As part of her stroke evaluation echocardiogram was completed that showed an akinetic anteroseptal wall and apex and a troponin that was elevated to 6500.  Per report she did not have any chest pain prior to this, and she very clearly denies chest pain at this time.  Most likely this is Takotsubo in the setting of her stroke/TIA, but given her inability to communicate fully we cannot definitively rule out ACS.  Additionally her repeat EKG is concerning for ST elevations in the anterior leads, though again not in contiguous  fashion.  Given her renal insufficiency and other complaints of aphasia I think we manage her conservatively at this time.  We will need to monitor closely and trend troponins.  -Continue aspirin and Plavix -Agree with initiation of heparin infusion -Continue Lipitor 40 mg daily -We will hold on initiation of metoprolol since we are in the period of permissive hypertension following her stroke -Please repeat EKG now -Trend troponin until peak -Pending clinical course she may need an ischemic evaluation prior to discharge.  Signed: Andrez Grime MD 10/31/2021, 4:58 PM

## 2021-10-31 NOTE — ED Notes (Signed)
Pt adjusted in bed. Pt was ask if she hurting? Pt, "No". Pt asked if need for food or drink? Pt, "No" NADN Pt closed eyes and back to sleep

## 2021-10-31 NOTE — H&P (Addendum)
History and Physical    Linda [name] TML:[id number] DOB: [birth year] DOA: 10/31/2021  Referring MD/NP/PA:   PCP: Sofie Hartigan, MD   Patient coming from:  The patient is coming from home.  At baseline, pt is independent for most of ADL.        Chief Complaint: worsening aphasia, weakness, cough and SOB  HPI: Linda [name] is a 75 y.o. female with medical history significant of stroke with aphasia, hypertension, hyperlipidemia, depression with anxiety, dementia, CKD-IV, sCHF, brain aneurysm, former smoker, who presents with worsening facial and difficulty walking.  Per her daughter at the bedside, patient has a history of stroke with aphasia, but the patient is able to communicate with family members.  Since last night at about 9 PM, patient was noted to have worsening aphasia, more difficulty speaking.  Patient also has weakness.  He normally uses cane, but now can not walk anymore.   Patient has shortness breath and cough, denies chest pain.  No fever or chills.  Per nurse report, patient had 1 episode of aspiration in ED. Before the aspiration event, patient had oxygen saturation 94% on room air, then developed respiratory distress, 4 L oxygen was started in ED.  Patient denies nausea, vomiting, diarrhea or abdominal pain.  Denies symptoms of UTI.   ED Course: pt was found to have  trop 6595, WBC 14.4, lactic acid 1.6, INR 1.1, PTT 28, BNP 502.7, urinalysis (hazy appearance, trace amount of leukocyte, many bacteria, WBC 6-10), CKD 2335, negative COVID PCR, worsening renal function, temperature 97.5, blood pressure 157/99, heart rate 94, RR 22.  Chest x-ray showed bilateral interstitial opacity. Pt is place in med-surg bed for obs.  CT-head: 1. Severe chronic ischemic disease. No acute cortically based infarct or acute intracranial hemorrhage identified. 2. Previous left MCA aneurysm coil embolization.   MRI/MRA: No acute infarction or other acute abnormality. Stable chronic  findings compared to recent prior study. No significant vascular abnormality. Post coiling   Carotid artery Doppler: Bilateral carotid atherosclerosis. Negative for significant ICA stenosis. Degree of narrowing less than 50% bilaterally by ultrasound criteria.  Patent antegrade vertebral flow bilaterally  2d echo: Left ventricular ejection fraction, by estimation, is 30 to 35%. The left ventricle has moderately decreased function. The left ventricle demonstrates regional wall motion abnormalities (see scoring diagram/findings for description). Left ventricular diastolic parameters are consistent with Grade I diastolic dysfunction (impaired relaxation). 1. 2. Right ventricular systolic function is normal. The right ventricular size is normal. The mitral valve is normal in structure. Mild mitral valve regurgitation. No evidence of mitral stenosis. 3. The aortic valve was not well visualized. Aortic valve regurgitation is not visualized. No aortic stenosis is present.   Review of Systems:   General: no fevers, chills, no body weight gain, has fatigue HEENT: no blurry vision, hearing changes or sore throat Respiratory: has dyspnea, coughing, no wheezing CV: no chest pain, no palpitations GI: no nausea, vomiting, abdominal pain, diarrhea, constipation GU: no dysuria, burning on urination, increased urinary frequency, hematuria  Ext: has mild leg edema Neuro: no vision change or hearing loss. Has worsening aphasia and difficulty walking Skin: no rash, no skin tear. MSK: No muscle spasm, no deformity, no limitation of range of movement in spin Heme: No easy bruising.  Travel history: No recent long distant travel.  Allergy: No Known Allergies  Past Medical History:  Diagnosis Date   Anxiety    Benign neoplasm of colon 03/27/2014   Brain aneurysm 2006  Cerebrovascular disease 06/28/2014   Chronic kidney disease    Dementia, vascular ([place]) 06/19/2014   Depression     Hyperlipidemia 02/20/2019   Pure hypercholesterolemia 03/27/2014   Receptive aphasia    Sleep apnea    Stroke [facility])    Vascular dementia [facility])    Wears dentures     Past Surgical History:  Procedure Laterality Date   CATARACT EXTRACTION W/PHACO Left 04/27/2018   Procedure: CATARACT EXTRACTION PHACO AND INTRAOCULAR LENS PLACEMENT ([place]) LEFT;  Surgeon: Leandrew Koyanagi, MD;  Location: [place];  Service: Ophthalmology;  Laterality: Left;   CATARACT EXTRACTION W/PHACO Right 06/14/2018   Procedure: CATARACT EXTRACTION PHACO AND INTRAOCULAR LENS PLACEMENT ([place])  RIGHT;  Surgeon: Leandrew Koyanagi, MD;  Location: [place];  Service: Ophthalmology;  Laterality: Right;   CEREBRAL ANEURYSM REPAIR      Social History:  reports that she has quit smoking. Her smoking use included cigarettes. She smoked an average of 1 pack per day. She has never used smokeless tobacco. She reports that she does not currently use alcohol. She reports that she does not use drugs.  Family History:  Family History  Problem Relation Age of Onset   Dementia Mother    Heart attack Brother      Prior to Admission medications   Medication Sig Start Date End Date Taking? Authorizing Provider  atorvastatin (LIPITOR) 40 MG tablet Take 1 tablet (40 mg total) by mouth daily. 06/15/20   Ezekiel Slocumb, DO  citalopram (CELEXA) 20 MG tablet Take 20 mg by mouth daily.    [provider]  clopidogrel (PLAVIX) 75 MG tablet Take 1 tablet (75 mg total) by mouth daily. 06/16/20   Ezekiel Slocumb, DO  losartan (COZAAR) 100 MG tablet Take 100 mg by mouth daily. 06/19/20   [provider]  multivitamin-lutein (OCUVITE-LUTEIN) CAPS capsule Take 1 capsule by mouth daily.    [provider]  MYRBETRIQ 50 MG TB24 tablet Take 1 tablet by mouth once daily 05/19/21   Billey Co, MD  Vitamin D, Ergocalciferol, (DRISDOL) 50000 units CAPS capsule Take 50,000 Units by mouth once a week.  03/15/18    [provider]    Physical Exam: Vitals:   10/31/21 1708 10/31/21 1730 10/31/21 1800 10/31/21 1824  BP: (!) 143/76 133/76 132/78 (!) 149/76  Pulse: 81 80  87  Resp: (!) 22 (!) 23 (!) 21 (!) 21  Temp:    97.9 F (36.6 C)  TempSrc:    Oral  SpO2: 95% 97%  97%  Weight:      Height:       General: Not in acute distress HEENT:       Eyes: PERRL, EOMI, no scleral icterus.       ENT: No discharge from the ears and nose, no pharynx injection, no tonsillar enlargement.        Neck: No JVD, no bruit, no mass felt. Heme: No neck lymph node enlargement. Cardiac: S1/S2, RRR, No murmurs, No gallops or rubs. Respiratory: has coarse breathing sound bilaterally GI: Soft, nondistended, nontender, no rebound pain, no organomegaly, BS present. GU: No hematuria Ext: has trace leg edema bilaterally. 1+DP/PT pulse bilaterally. Musculoskeletal: No joint deformities, No joint redness or warmth, no limitation of ROM in spin. Skin: No rashes.  Neuro: Alert, cranial nerves II-XII grossly intact. Muscle strength 4/5 in right let and 5/5 in all other extremities. Psych: Patient is not psychotic, no suicidal or hemocidal ideation.  Labs on Admission:  I have personally reviewed following labs and imaging studies  CBC: Recent Labs  Lab 10/31/21 0648  WBC 14.4*  NEUTROABS 12.4*  HGB 10.5*  HCT 33.0*  MCV 99.1  PLT 967   Basic Metabolic Panel: Recent Labs  Lab 10/31/21 0648  NA 136  K 5.1  CL 106  CO2 20*  GLUCOSE 118*  BUN 52*  CREATININE 2.94*  CALCIUM 9.2   GFR: Estimated Creatinine Clearance: 16.6 mL/min (A) (by C-G formula based on SCr of 2.94 mg/dL (H)). Liver Function Tests: Recent Labs  Lab 10/31/21 0648  AST 59*  ALT 23  ALKPHOS 48  BILITOT 0.5  PROT 8.2*  ALBUMIN 3.9   No results for input(s): LIPASE, AMYLASE in the last 168 hours. No results for input(s): AMMONIA in the last 168 hours. Coagulation Profile: Recent Labs  Lab 10/31/21 0648  INR 1.1    Cardiac Enzymes: Recent Labs  Lab 10/31/21 0648  CKTOTAL 2,335*   BNP (last 3 results) No results for input(s): PROBNP in the last 8760 hours. HbA1C: No results for input(s): HGBA1C in the last 72 hours. CBG: Recent Labs  Lab 10/31/21 0622  GLUCAP 127*   Lipid Profile: No results for input(s): CHOL, HDL, LDLCALC, TRIG, CHOLHDL, LDLDIRECT in the last 72 hours. Thyroid Function Tests: Recent Labs    10/31/21 0648  TSH 6.691*   Anemia Panel: No results for input(s): VITAMINB12, FOLATE, FERRITIN, TIBC, IRON, RETICCTPCT in the last 72 hours. Urine analysis:    Component Value Date/Time   COLORURINE YELLOW (A) 10/31/2021 0946   APPEARANCEUR HAZY (A) 10/31/2021 0946   APPEARANCEUR Cloudy (A) 04/11/2018 1601   LABSPEC 1.011 10/31/2021 0946   PHURINE 5.0 10/31/2021 0946   GLUCOSEU NEGATIVE 10/31/2021 0946   HGBUR LARGE (A) 10/31/2021 0946   BILIRUBINUR NEGATIVE 10/31/2021 0946   BILIRUBINUR Negative 04/11/2018 1601   KETONESUR NEGATIVE 10/31/2021 0946   PROTEINUR 30 (A) 10/31/2021 0946   NITRITE NEGATIVE 10/31/2021 0946   LEUKOCYTESUR TRACE (A) 10/31/2021 0946   Sepsis Labs: @LABRCNTIP (procalcitonin:4,lacticidven:4) ) Recent Results (from the past 240 hour(s))  Resp Panel by RT-PCR (Flu A&B, Covid) Nasopharyngeal Swab     Status: None   Collection Time: 10/31/21 12:48 AM   Specimen: Nasopharyngeal Swab; Nasopharyngeal(NP) swabs in vial transport medium  Result Value Ref Range Status   SARS Coronavirus 2 by RT PCR NEGATIVE NEGATIVE Final    Comment: (NOTE) SARS-CoV-2 target nucleic acids are NOT DETECTED.  The SARS-CoV-2 RNA is generally detectable in upper respiratory specimens during the acute phase of infection. The lowest concentration of SARS-CoV-2 viral copies this assay can detect is 138 copies/mL. A negative result does not preclude SARS-Cov-2 infection and should not be used as the sole basis for treatment or other patient management decisions. A  negative result may occur with  improper specimen collection/handling, submission of specimen other than nasopharyngeal swab, presence of viral mutation(s) within the areas targeted by this assay, and inadequate number of viral copies(<138 copies/mL). A negative result must be combined with clinical observations, patient history, and epidemiological information. The expected result is Negative.  Fact Sheet for Patients:  EntrepreneurPulse.com.au  Fact Sheet for Healthcare Providers:  IncredibleEmployment.be  This test is no t yet approved or cleared by the [place] FDA and  has been authorized for detection and/or diagnosis of SARS-CoV-2 by FDA under an Emergency Use Authorization (EUA). This EUA will remain  in effect (meaning this test can be used) for the duration of the COVID-19 declaration under  Section 564(b)(1) of the Act, 21 U.S.C.section 360bbb-3(b)(1), unless the authorization is terminated  or revoked sooner.       Influenza A by PCR NEGATIVE NEGATIVE Final   Influenza B by PCR NEGATIVE NEGATIVE Final    Comment: (NOTE) The Xpert Xpress SARS-CoV-2/FLU/RSV plus assay is intended as an aid in the diagnosis of influenza from Nasopharyngeal swab specimens and should not be used as a sole basis for treatment. Nasal washings and aspirates are unacceptable for Xpert Xpress SARS-CoV-2/FLU/RSV testing.  Fact Sheet for Patients: EntrepreneurPulse.com.au  Fact Sheet for Healthcare Providers: IncredibleEmployment.be  This test is not yet approved or cleared by the [place] FDA and has been authorized for detection and/or diagnosis of SARS-CoV-2 by FDA under an Emergency Use Authorization (EUA). This EUA will remain in effect (meaning this test can be used) for the duration of the COVID-19 declaration under Section 564(b)(1) of the Act, 21 U.S.C. section 360bbb-3(b)(1), unless the authorization  is terminated or revoked.  Performed at [facility], [address] [place], [place] [address]      Radiological Exams on Admission: MR ANGIO HEAD WO CONTRAST  Result Date: 10/31/2021 CLINICAL DATA:  Neuro deficit, acute, stroke suspected EXAM: MRI HEAD WITHOUT CONTRAST MRA HEAD WITHOUT CONTRAST TECHNIQUE: Multiplanar, multi-echo pulse sequences of the brain and surrounding structures were acquired without intravenous contrast. Angiographic images of the Circle of Willis were acquired using MRA technique without intravenous contrast. COMPARISON:  10/15/2021 FINDINGS: MRI HEAD FINDINGS Brain: No acute infarction or hemorrhage. Encephalomalacia and gliosis in the left cerebral hemisphere with involvement of frontal, parietal, temporal, and occipital lobes. Associated chronic blood products particularly in the temporal lobe. Ex vacuo dilatation of the left lateral ventricle. Chronic small vessel infarcts of the central gray nuclei, central white matter, pons, and cerebellum. Few additional scattered foci of susceptibility likely reflecting chronic microhemorrhages. Other stable confluent areas of T2 hyperintensity in the supratentorial and pontine white matter are nonspecific but probably reflect advanced chronic microvascular ischemic changes. Prominence of the ventricles and sulci reflects parenchymal volume loss. Vascular: Susceptibility artifact related to left MCA aneurysm coiling. Skull and upper cervical spine: Marrow signal is within normal limits. Sinuses/Orbits: Chronic left sphenoid sinusitis. Bilateral lens replacements. Other: Suprasellar mass along pituitary infundibulum is not well evaluated on this study. Persistent bilateral mastoid effusions. MRA HEAD FINDINGS Anterior circulation: Intracranial internal carotid arteries are patent with atherosclerotic irregularity. Anterior cerebral arteries are patent. Middle cerebral arteries are patent. There is susceptibility artifact at  site of left MCA bifurcation aneurysm coiling. No evidence of recurrence. Posterior circulation: Intracranial vertebral arteries are patent. The right vertebral artery terminates as a PICA. Basilar artery is patent. Posterior cerebral arteries are patent. Bilateral posterior communicating arteries are present. Fetal origin of the left PCA. IMPRESSION: No acute infarction or other acute abnormality. Stable chronic findings compared to recent prior study. No significant vascular abnormality. 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 Electronically Signed   By: Macy Mis M.D.   On: 10/31/2021 12:42   MR BRAIN WO CONTRAST  Result Date: 10/31/2021 CLINICAL DATA:  Neuro deficit, acute, stroke suspected EXAM: MRI HEAD WITHOUT CONTRAST MRA HEAD WITHOUT CONTRAST TECHNIQUE: Multiplanar, multi-echo pulse sequences of the brain and surrounding structures were acquired without intravenous contrast. Angiographic images of the Circle of Willis were acquired using MRA technique without intravenous contrast. COMPARISON:  10/15/2021 FINDINGS: MRI HEAD FINDINGS Brain: No acute infarction or hemorrhage. Encephalomalacia and gliosis in the left cerebral hemisphere  with involvement of frontal, parietal, temporal, and occipital lobes. Associated chronic blood products particularly in the temporal lobe. Ex vacuo dilatation of the left lateral ventricle. Chronic small vessel infarcts of the central gray nuclei, central white matter, pons, and cerebellum. Few additional scattered foci of susceptibility likely reflecting chronic microhemorrhages. Other stable confluent areas of T2 hyperintensity in the supratentorial and pontine white matter are nonspecific but probably reflect advanced chronic microvascular ischemic changes. Prominence of the ventricles and sulci reflects parenchymal volume loss. Vascular: Susceptibility artifact related to left MCA aneurysm coiling. Skull and upper cervical  spine: Marrow signal is within normal limits. Sinuses/Orbits: Chronic left sphenoid sinusitis. Bilateral lens replacements. Other: Suprasellar mass along pituitary infundibulum is not well evaluated on this study. Persistent bilateral mastoid effusions. MRA HEAD FINDINGS Anterior circulation: Intracranial internal carotid arteries are patent with atherosclerotic irregularity. Anterior cerebral arteries are patent. Middle cerebral arteries are patent. There is susceptibility artifact at site of left MCA bifurcation aneurysm coiling. No evidence of recurrence. Posterior circulation: Intracranial vertebral arteries are patent. The right vertebral artery terminates as a PICA. Basilar artery is patent. Posterior cerebral arteries are patent. Bilateral posterior communicating arteries are present. Fetal origin of the left PCA. IMPRESSION: No acute infarction or other acute abnormality. Stable chronic findings compared to recent prior study. No significant vascular abnormality. 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 Electronically Signed   By: Macy Mis M.D.   On: 10/31/2021 12:42   US Carotid Bilateral (at [facility] and [facility] only)  Result Date: 10/31/2021 CLINICAL DATA:  Stroke, hyperlipidemia, former smoker EXAM: BILATERAL CAROTID DUPLEX ULTRASOUND TECHNIQUE: Pearline Cables scale imaging, color Doppler and duplex ultrasound were performed of bilateral carotid and vertebral arteries in the neck. COMPARISON:  None. FINDINGS: Criteria: Quantification of carotid stenosis is based on velocity parameters that correlate the residual internal carotid diameter with NASCET-based stenosis levels, using the diameter of the distal internal carotid lumen as the denominator for stenosis measurement. The following velocity measurements were obtained: RIGHT ICA: 71/27 cm/sec CCA: 19/14 cm/sec SYSTOLIC ICA/CCA RATIO:  1.0 ECA: 47 cm/sec LEFT ICA: 127/36 cm/sec CCA: 78/29 cm/sec SYSTOLIC ICA/CCA RATIO:   2.8 ECA: 46 cm/sec RIGHT CAROTID ARTERY: Moderate mixed echogenicity carotid bifurcation atherosclerosis. Despite this, no significant ICA stenosis, velocity elevation, turbulent flow. Degree of narrowing less than 50% by ultrasound criteria. RIGHT VERTEBRAL ARTERY:  Normal antegrade flow LEFT CAROTID ARTERY: Similar moderate mixed echogenicity irregular bifurcation atherosclerosis. Negative for significant stenosis, velocity elevation, turbulent flow. Degree of narrowing also less than 50% by ultrasound criteria. LEFT VERTEBRAL ARTERY:  Normal antegrade flow IMPRESSION: Bilateral carotid atherosclerosis. Negative for significant ICA stenosis. Degree of narrowing less than 50% bilaterally by ultrasound criteria. Patent antegrade vertebral flow bilaterally Electronically Signed   By: Jerilynn Mages.  Shick M.D.   On: 10/31/2021 14:25   DG Chest Portable 1 View  Result Date: 10/31/2021 CLINICAL DATA:  75 year old female code stroke presentation. Cough for 1 week. EXAM: PORTABLE CHEST 1 VIEW COMPARISON:  Chest radiographs 03/17/2012. FINDINGS: Portable AP upright view at 0652 hours. Chronic large lung volumes. Mild cardiomegaly appears new since 2013. Other mediastinal contours are within normal limits. Visualized tracheal air column is within normal limits. Chronic but increased and coarse bilateral pulmonary interstitial opacity, fairly symmetric and with some basilar predominance. No pneumothorax, pleural effusion or confluent opacity. No acute osseous abnormality identified. Negative visible bowel gas. IMPRESSION: Chronic hyperinflation with acute on chronic bilateral pulmonary interstitial opacity. Top differential considerations include viral/atypical respiratory infection,  pulmonary edema, progressed chronic lung disease. Mild cardiomegaly.  No pleural effusion. Electronically Signed   By: Genevie Ann M.D.   On: 10/31/2021 07:14   ECHOCARDIOGRAM COMPLETE  Result Date: 10/31/2021    ECHOCARDIOGRAM REPORT   Patient Name:    SHARLA TANKARD Date of Exam: 10/31/2021 Medical Rec #:  [id number]      Height:       66.0 in Accession #:    [id number]     Weight:       160.3 lb Date of Birth:  02-11-1946      BSA:          1.820 m Patient Age:    49 years       BP:           145/87 mmHg Patient Gender: F              HR:           85 bpm. Exam Location:  [facility] Procedure: 2D Echo, Cardiac Doppler and Color Doppler Indications:     Stroke I63.9  History:         Patient has no prior history of Echocardiogram examinations.                  Stroke; Risk Factors:Dyslipidemia. CKD.  Sonographer:     Sherrie Sport Referring Phys:  Unknown Foley Dacoda Spallone Diagnosing Phys: Donnelly Angelica  Sonographer Comments: Suboptimal apical window. IMPRESSIONS  1. Left ventricular ejection fraction, by estimation, is 30 to 35%. The left ventricle has moderately decreased function. The left ventricle demonstrates regional wall motion abnormalities (see scoring diagram/findings for description). Left ventricular  diastolic parameters are consistent with Grade I diastolic dysfunction (impaired relaxation).  2. Right ventricular systolic function is normal. The right ventricular size is normal.  3. The mitral valve is normal in structure. Mild mitral valve regurgitation. No evidence of mitral stenosis.  4. The aortic valve was not well visualized. Aortic valve regurgitation is not visualized. No aortic stenosis is present. 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 FINDINGS  Left Ventricle: Left ventricular ejection fraction, by estimation, is 30 to 35%. The left ventricle has moderately decreased function. The left ventricle demonstrates regional wall motion abnormalities. The left ventricular internal cavity size was normal in size. There is no left ventricular hypertrophy. Left ventricular diastolic parameters are consistent with Grade I diastolic dysfunction (impaired relaxation).  LV Wall Scoring: The entire septum is  akinetic. The basal anterior segment is hypokinetic. Right Ventricle: The right ventricular size is normal. No increase in right ventricular wall thickness. Right ventricular systolic function is normal. Left Atrium: Left atrial size was normal in size. Right Atrium: Right atrial size was normal in size. Pericardium: There is no evidence of pericardial effusion. Mitral Valve: The mitral valve is normal in structure. Mild mitral valve regurgitation. No evidence of mitral valve stenosis. MV peak gradient, 5.9 mmHg. The mean mitral valve gradient is 2.0 mmHg. Tricuspid Valve: The tricuspid valve is normal in structure. Tricuspid valve regurgitation is mild. Aortic Valve: The aortic valve was not well visualized. Aortic valve regurgitation is not visualized. No aortic stenosis is present. Aortic valve mean gradient measures 1.5 mmHg. Aortic valve peak gradient measures 3.3 mmHg. Aortic valve area, by VTI measures 4.42 cm. Pulmonic Valve: The pulmonic valve was not well visualized. Pulmonic valve regurgitation is not visualized. Aorta: The aortic root was not well visualized. IAS/Shunts: The interatrial septum was not assessed.  LEFT  VENTRICLE PLAX 2D LVIDd:         4.00 cm     Diastology LVIDs:         3.40 cm     LV e' medial:    3.92 cm/s LV PW:         1.20 cm     LV E/e' medial:  12.6 LV IVS:        0.80 cm     LV e' lateral:   5.00 cm/s LVOT diam:     2.10 cm     LV E/e' lateral: 9.9 LV SV:         63 LV SV Index:   35 LVOT Area:     3.46 cm  LV Volumes (MOD) LV vol d, MOD A2C: 84.5 ml LV vol d, MOD A4C: 64.1 ml LV vol s, MOD A2C: 71.1 ml LV vol s, MOD A4C: 34.9 ml LV SV MOD A2C:     13.4 ml LV SV MOD A4C:     64.1 ml LV SV MOD BP:      24.6 ml RIGHT VENTRICLE RV Basal diam:  3.50 cm RV S prime:     10.30 cm/s TAPSE (M-mode): 3.4 cm LEFT ATRIUM             Index        RIGHT ATRIUM           Index LA diam:        3.00 cm 1.65 cm/m   RA Area:     10.40 cm LA Vol (A2C):   48.2 ml 26.48 ml/m  RA Volume:   21.10  ml  11.59 ml/m LA Vol (A4C):   67.4 ml 37.03 ml/m LA Biplane Vol: 62.3 ml 34.23 ml/m  AORTIC VALVE                    PULMONIC VALVE AV Area (Vmax):    3.78 cm     PV Vmax:        0.59 m/s AV Area (Vmean):   3.81 cm     PV Vmean:       38.300 cm/s AV Area (VTI):     4.42 cm     PV VTI:         0.083 m AV Vmax:           90.35 cm/s   PV Peak grad:   1.4 mmHg AV Vmean:          59.950 cm/s  PV Mean grad:   1.0 mmHg AV VTI:            0.144 m      RVOT Peak grad: 2 mmHg AV Peak Grad:      3.3 mmHg AV Mean Grad:      1.5 mmHg LVOT Vmax:         98.60 cm/s LVOT Vmean:        66.000 cm/s LVOT VTI:          0.183 m LVOT/AV VTI ratio: 1.28  AORTA Ao Root diam: 2.70 cm MITRAL VALVE               TRICUSPID VALVE MV Area (PHT): 5.34 cm    TR Peak grad:   43.8 mmHg MV Area VTI:   3.88 cm    TR Vmax:        331.00 cm/s MV Peak grad:  5.9 mmHg MV Mean grad:  2.0 mmHg  SHUNTS MV Vmax:       1.21 m/s    Systemic VTI:  0.18 m MV Vmean:      70.4 cm/s   Systemic Diam: 2.10 cm MV Decel Time: 142 msec    Pulmonic VTI:  0.116 m MV E velocity: 49.30 cm/s MV A velocity: 84.80 cm/s MV E/A ratio:  0.58 Donnelly Angelica Electronically signed by Donnelly Angelica Signature Date/Time: 10/31/2021/3:14:43 PM    Final    CT HEAD CODE STROKE WO CONTRAST  Result Date: 10/31/2021 CLINICAL DATA:  Code stroke.  75 year old female with aphasia. EXAM: CT HEAD WITHOUT CONTRAST TECHNIQUE: Contiguous axial images were obtained from the base of the skull through the vertex without intravenous contrast. COMPARISON:  Recent brain MRI 10/15/2021.  Head CT 06/27/2020. 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 Fairly extensive chronic encephalomalacia in the left hemisphere involving both the MCA and PCA territories. Superimposed advanced bilateral cerebral white matter hypodensity. Small area of chronic encephalomalacia in the anterior right MCA territory. Advanced small vessel disease  throughout the bilateral deep gray nuclei. And chronic small vessel disease also in the brainstem and bilateral cerebellar hemispheres. Ex vacuo ventricular enlargement is stable from earlier this month. No superimposed midline shift, mass effect, intracranial hemorrhage or evidence of cortically based acute infarction. 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 Vascular: Calcified atherosclerosis at the skull base. 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 No suspicious intracranial vascular hyperdensity. Skull: No acute osseous abnormality identified. Sinuses/Orbits: Chronic sinusitis with scattered mucoperiosteal thickening has not significantly changed from last year. Mild right mastoid effusion not significantly changed. Other: No acute orbit or scalp soft tissue finding. ASPECTS [facility] Stroke Program Early CT Score) Total score (0-10 with 10 being normal): 10 (chronic encephalomalacia) IMPRESSION: 1. Severe chronic ischemic disease. No acute cortically based infarct or acute intracranial hemorrhage identified. 2. Previous left MCA aneurysm coil embolization. Study discussed by telephone with Dr. Pryor Curia on 10/31/2021 at 06:36 . Electronically Signed   By: Genevie Ann M.D.   On: 10/31/2021 06:37     EKG: I have personally reviewed.  Sinus rhythm, QTC 447, borderline left axis deviation, nonspecific T wave change.   Assessment/Plan Principal Problem:   TIA (transient ischemic attack) Active Problems:   Hyperlipidemia   Essential hypertension   Stroke (HCC)   Chronic systolic CHF (congestive heart failure) (HCC)   Depression with anxiety   Atypical pneumonia   Rhabdomyolysis   Sepsis ([place])   CKD (chronic kidney disease), stage IV (HCC)   Acute respiratory failure with hypoxia (HCC)   UTI (urinary tract infection)   Elevated troponin   TIA (transient ischemic attack) and hx of stroke: Patient has history of stroke with aphasia, now presents with worsening  aphasia and right-sided weakness.  Concerning for new stroke.  Teleneurology was consulted, who recommended to do stroke work-up including MRI for brain, carotid artery Doppler and TEE.  He also recommended permissive hypertension. MRI/MRA showed no acute infarction and no significant vascular abnormality, indicating possible new TIA.    -Placed on MedSurg bed for observation - will hold oral Bp meds to allow permissive HTN in the setting of acute stroke - Plavix and add ASA   - lipitor - fasting lipid panel and HbA1c  - swallowing screen. If fails, will get SLP - PT/OT consult  Hyperlipidemia -Lipitor  Essential hypertension -Hold amlodipine and Cozaar -IV hydralazine as needed for SBP>220 or dBP>120 -->  will change to prn hydralazine for SBP>180 or dBP> 100 due to elevated trop.  Depression with anxiety:  -Continue home medications  Acute respiratory failure with hypoxia due to atypical pneumonia and possible aspiration pneumonia: -Rocephin, azithromycin, Flagyl -Follow-up blood culture, sputum culture, urine antigen for Legionella and strep -bronchodilators  UTI (urinary tract infection) -on rocephin -f/u Urine culture  Sepsis due to atypical pneumonia and possible aspiration pneumonia: lactic acid normal -pt in on NS at 50 cc/h (patient has EF of 30-34%, limiting aggressive IV fluid treatment) -Check procalcitonin level  Chronic systolic CHF (congestive heart failure) ([place]): Today 2d echo showed EF 30 to 35%.  Patient has elevated BNP 502, indicating possible fluid overload.  Due to worsening renal function and rhabdomyolysis, will not start diuretics. -Watch volume status closely  Rhabdomyolysis: CK 2335 -on NS at 50 cc/h -repeat CK in AM  CKD (chronic kidney disease), stage IV [facility]): Recent baseline creatinine 1.9-1.6.  Her creatinine is at 2.94, BUN 52, worsening than baseline -Hold Cozaar -On gentle IV fluid as above  Elevated troponin: initially, pt did not have  chest pain. As part of stroke work up, 2d echo was done which showed EF or 30 to 35%. The left ventricle has moderately decreased function. The left ventricle  demonstrates regional wall motion abnormalities. Left ventricular diastolic parameters are consistent with Grade I diastolic dysfunction (impaired relaxation). Dr. Corky Sox of card is consulted. He recommended to check trop which came back elevated at 6595. The Repeated EKG showed ST elevation in V2-V4, T wave inversion in lateral leads.  Concerning for STEMI.  Since patient has stage IV CKD, per Dr. Corky Sox, pt is not a good candidate for cardiac cath. -started IV heparin -Aspirin plus Plavix -Lipitor -Check A1c, FLP -f/u cardiologist recommendation.   DVT ppx: on IV Heparin  Code Status: Full code Family Communication:   Yes, patient's daughter   at bed side Disposition Plan:  Anticipate discharge back to previous environment Consults called:  Dr. Corky Sox of card and tele neuro Admission status and Level of care: Progressive:  as inpt     Status is: Inpatient  Remains inpatient appropriate because: Patient has multiple comorbidities, now presents with possible TIA, acute respiratory failure with hypoxia due to atypical pneumonia and possible aspiration pneumonia, sepsis, rhabdomyolysis with CK 2335, UTI.  Presentation is highly complicated.  Patient is at high risk of deteriorating.  Will need to be treated in hospital for at least 2 days.           Date of Service 10/31/2021    [place] [facility]   If 7PM-7AM, please contact night-coverage [email or web address] 10/31/2021, 7:27 PM

## 2021-10-31 NOTE — Evaluation (Signed)
Clinical/Bedside Swallow Evaluation Patient Details  Name: Linda [name] MRN: [id number] Date of Birth: 1946-07-16  Today's Date: 10/31/2021 Time: SLP Start Time (ACUTE ONLY): 0938 SLP Stop Time (ACUTE ONLY): 1450 SLP Time Calculation (min) (ACUTE ONLY): 25 min  Past Medical History:  Past Medical History:  Diagnosis Date   Anxiety    Benign neoplasm of colon 03/27/2014   Brain aneurysm 2006   Cerebrovascular disease 06/28/2014   Chronic kidney disease    Dementia, vascular ([place]) 06/19/2014   Depression    Hyperlipidemia 02/20/2019   Pure hypercholesterolemia 03/27/2014   Receptive aphasia    Sleep apnea    Stroke [facility])    Vascular dementia ([place])    Wears dentures    Past Surgical History:  Past Surgical History:  Procedure Laterality Date   CATARACT EXTRACTION W/PHACO Left 04/27/2018   Procedure: CATARACT EXTRACTION PHACO AND INTRAOCULAR LENS PLACEMENT ([place]) LEFT;  Surgeon: Leandrew Koyanagi, MD;  Location: [place];  Service: Ophthalmology;  Laterality: Left;   CATARACT EXTRACTION W/PHACO Right 06/14/2018   Procedure: CATARACT EXTRACTION PHACO AND INTRAOCULAR LENS PLACEMENT ([place])  RIGHT;  Surgeon: Leandrew Koyanagi, MD;  Location: [place];  Service: Ophthalmology;  Laterality: Right;   CEREBRAL ANEURYSM REPAIR     HPI:  Linda [name] is a 75 year-old woman with a PMH of HTN, CKD, L MCA Infarct, Vascular Dementia with a baseline expressive aphasia who was BIBEMS for altered mental status. MRI was negative for acute infarction or abnormality, no significant vascular abnormality, post coiling of a left MCA bifurcation aneursm without evidence of recurrance and stable chronic findings compared to recent prior study.    Assessment / Plan / Recommendation  Clinical Impression  Pt presents with adequate oropharyngeal abilities when consuming ice chips, thin liquids via straw, puree and graham crackers. When consuming graham crackers, pt has trace residue on  tongue. Pt is largely nonverbal from previous strokes but communicates with gestures, eye contact and some glottal responses. Given pt's acute hospitalization, baseline communication deficits and trace oral residue, recommend a dysphagia 2 diet with thin liquids via straw and medicine whole in puree. This diet is recommended out of an abundance of caution. Pt's son present and agreeable. At discharge, pt may resume baseline diet as prepared by pt's family. No skilled ST intervention is indicated at this time. SLP Visit Diagnosis: Dysphagia, unspecified (R13.10)    Aspiration Risk  Mild aspiration risk    Diet Recommendation Dysphagia 2 (Fine chop);Thin liquid   Liquid Administration via: Straw Medication Administration: Whole meds with puree Supervision: Intermittent supervision to cue for compensatory strategies Compensations: Minimize environmental distractions;Slow rate;Small sips/bites Postural Changes: Seated upright at 90 degrees;Remain upright for at least 30 minutes after po intake    Other  Recommendations Oral Care Recommendations: Oral care BID    Recommendations for follow up therapy are one component of a multi-disciplinary discharge planning process, led by the attending physician.  Recommendations may be updated based on patient status, additional functional criteria and insurance authorization.  Follow up Recommendations No SLP follow up      Assistance Recommended at Discharge  N/A  Functional Status Assessment Patient has not had a recent decline in their functional status  Frequency and Duration   N/A         Prognosis   N/A     Swallow Study   General Date of Onset: 10/31/21 HPI: Linda [name] is a 75 year-old woman with a PMH of HTN, CKD, L MCA  Infarct, Vascular Dementia with a baseline expressive aphasia who was BIBEMS for altered mental status. MRI was negative for acute infarction or abnormality, no significant vascular abnormality, post coiling of a left MCA  bifurcation aneursm without evidence of recurrance and stable chronic findings compared to recent prior study. Previous Swallow Assessment: none in chart Diet Prior to this Study: NPO Temperature Spikes Noted: No Respiratory Status: Nasal cannula (4L) History of Recent Intubation: No Behavior/Cognition: Alert;Cooperative;Pleasant mood Oral Cavity Assessment: Within Functional Limits Oral Care Completed by SLP: No Oral Cavity - Dentition: Edentulous Vision: Functional for self-feeding Self-Feeding Abilities: Needs assist;Needs set up Patient Positioning: Upright in bed Baseline Vocal Quality: Normal Volitional Cough: Strong Volitional Swallow: Unable to elicit    Oral/Motor/Sensory Function Overall Oral Motor/Sensory Function: Generalized oral weakness   Ice Chips Ice chips: Within functional limits Presentation: Spoon   Thin Liquid Thin Liquid: Within functional limits Presentation: Self Fed;Straw    Nectar Thick Nectar Thick Liquid: Not tested   Honey Thick Honey Thick Liquid: Not tested   Puree Puree: Within functional limits Presentation: Spoon   Solid     Solid: Within functional limits (grham cracker)     Annalyn Blecher B. Rutherford Nail M.S., CCC-SLP, [place] [facility] Office [place] 10/31/2021,2:49 PM

## 2021-10-31 NOTE — Progress Notes (Signed)
[facility] for heparin infusion Indication: chest pain/ACS  No Known Allergies  Patient Measurements: Height: 5\' 6"  (167.6 cm) Weight: 72.7 kg (160 lb 4.4 oz) IBW/kg (Calculated) : 59.3 Heparin Dosing Weight: 72.7 kg  Vital Signs: Temp: 98.4 F (36.9 C) (11/18 1539) Temp Source: Oral (11/18 1539) BP: 141/82 (11/18 1539) Pulse Rate: 85 (11/18 1539)  Labs: Recent Labs    10/31/21 0648 10/31/21 1522  HGB 10.5*  --   HCT 33.0*  --   PLT 291  --   APTT 28  --   LABPROT 14.0  --   INR 1.1  --   CREATININE 2.94*  --   CKTOTAL 2,335*  --   TROPONINIHS  --  6,595*    Estimated Creatinine Clearance: 16.6 mL/min (A) (by C-G formula based on SCr of 2.94 mg/dL (H)).   Medical History: Past Medical History:  Diagnosis Date   Anxiety    Benign neoplasm of colon 03/27/2014   Brain aneurysm 2006   Cerebrovascular disease 06/28/2014   Chronic kidney disease    Dementia, vascular ([place]) 06/19/2014   Depression    Hyperlipidemia 02/20/2019   Pure hypercholesterolemia 03/27/2014   Receptive aphasia    Sleep apnea    Stroke [facility])    Vascular dementia ([place])    Wears dentures     Medications:  Per chart review, no anticoagulation PTA  Assessment: 75 y.o. female with history of hypertension, hyperlipidemia, chronic kidney disease, CVA, left MCA aneurysm status post coiling, and dementia. Pt was on SQ heparin and last dose was 11/18 1325. Pharmacy has been consulted for heparin. HS Trop [id number].   Baseline labs: aPTT, PT-INR and Plt WNL; Hgb 10.5, Hct 33  Goal of Therapy:  Heparin level 0.3-0.7 units/ml Monitor platelets by anticoagulation protocol: Yes   Plan:  Give 4000 units bolus x 1 Start heparin infusion at 850 units/hr Check anti-Xa level in 8 hours and daily while on heparin Continue to monitor H&H and platelets  Ozan Maclay O Quinnie Barcelo 10/31/2021,4:44 PM

## 2021-10-31 NOTE — Evaluation (Signed)
Occupational Therapy Evaluation Patient Details Name: Linda [name] MRN: [id number] DOB: 10-Sep-1946 Today's Date: 10/31/2021   History of Present Illness Ms. Howser is a 75 year-old woman with a PMH of HTN, CKD, L MCA Infarct, Vascular Dementia with a baseline expressive aphasia.   Clinical Impression   Ms Basic was seen for OT evaluation this date. Prior to hospital admission, pt was MOD I for mobility and ADLs using RW or SPC. Pt lives with husband available 24/7. Pt presents to acute OT demonstrating impaired ADL performance and functional mobility 2/2 decreased activity tolerance, decreased strength/balance. Daughter at bedside answers all questions. Pt has baseline expressive aphasia, however daughter reports pt typically verbal. Vocalizes/grunts but does not shake/nod head to questions and follows 1 step commands and sequences dressing.   Pt currently requires MOD A don B socks seated EOB - assist for posterior lean and bringing leg into figure 4. MIN A for ADL t/f, daughter reports decreased gait speed compared to normal, however shuffling gait normal. Of note, elevated troponin lab seen after eval completed. Pt in no acute distress t/o session with HR stable in 80s. Pt would benefit from skilled OT to address noted impairments and functional limitations (see below for any additional details) in order to maximize safety and independence while minimizing falls risk and caregiver burden. Upon hospital discharge, recommend STR to maximize pt safety and return to PLOF.       Recommendations for follow up therapy are one component of a multi-disciplinary discharge planning process, led by the attending physician.  Recommendations may be updated based on patient status, additional functional criteria and insurance authorization.   Follow Up Recommendations  Skilled nursing-short term rehab (<3 hours/day)    Assistance Recommended at Discharge Frequent or constant Supervision/Assistance   Functional Status Assessment  Patient has had a recent decline in their functional status and demonstrates the ability to make significant improvements in function in a reasonable and predictable amount of time.  Equipment Recommendations  Other (comment) (defer to next venue of care)    Recommendations for Other Services       Precautions / Restrictions Precautions Precautions: Fall Restrictions Weight Bearing Restrictions: No      Mobility Bed Mobility Overal bed mobility: Needs Assistance Bed Mobility: Supine to Sit;Sit to Supine     Supine to sit: Max assist Sit to supine: Max assist        Transfers Overall transfer level: Needs assistance Equipment used: 1 person hand held assist Transfers: Sit to/from Stand Sit to Stand: Min assist;From elevated surface                  Balance Overall balance assessment: Needs assistance Sitting-balance support: No upper extremity supported;Feet supported Sitting balance-Leahy Scale: Fair     Standing balance support: Bilateral upper extremity supported Standing balance-Leahy Scale: Poor                             ADL either performed or assessed with clinical judgement   ADL Overall ADL's : Needs assistance/impaired                                       General ADL Comments: MOD A don B socks seated EOB - assist for posterior lean and bringing leg into figure 4      Pertinent Vitals/Pain Pain Assessment: Faces  Faces Pain Scale: Hurts a little bit Breathing: normal Negative Vocalization: none Facial Expression: smiling or inexpressive Body Language: relaxed Consolability: no need to console PAINAD Score: 0 Pain Location: bottom Pain Descriptors / Indicators: Discomfort;Dull;Grimacing Pain Intervention(s): Limited activity within patient's tolerance;Repositioned     Hand Dominance     Extremity/Trunk Assessment Upper Extremity Assessment Upper Extremity Assessment:  Generalized weakness   Lower Extremity Assessment Lower Extremity Assessment: Generalized weakness       Communication Communication Communication: No difficulties   Cognition Arousal/Alertness: Awake/alert Behavior During Therapy: WFL for tasks assessed/performed Overall Cognitive Status: Difficult to assess                                 General Comments: expressive aphasia, daughter reports pt typically communicates. Vocalizes/grunts but does not shake/nod head to questions. Follows 1 step commands and sequences dressing     General Comments  SpO2 high 90s on RA    Exercises Exercises: Other exercises Other Exercises Other Exercises: Family educated re; OT role, DME recs, d/c recs, falls prevention Other Exercises: LBD, sup<>sit, sit<>Stand   Shoulder Instructions      Home Living Family/patient expects to be discharged to:: Private residence Living Arrangements: Spouse/significant other Available Help at Discharge: Family;Available 24 hours/day Type of Home: House Home Access: Stairs to enter [employer] of Steps: 2 Entrance Stairs-Rails: Left Home Layout: One level               Home Equipment: Conservation officer, nature (2 wheels);Cane - single point;BSC/3in1          Prior Functioning/Environment Prior Level of Function : Independent/Modified Independent             Mobility Comments: MOD I using RW or SPC for mobility - daughter reports shuffling slow gait at baseline but completes stairs without assist ADLs Comments: dressing no assist at baseline        OT Problem List: Decreased strength;Decreased range of motion;Decreased activity tolerance;Impaired balance (sitting and/or standing);Decreased safety awareness;Decreased cognition      OT Treatment/Interventions: Self-care/ADL training;Therapeutic exercise;Energy conservation;DME and/or AE instruction;Therapeutic activities;Cognitive remediation/compensation;Patient/family  education;Balance training    OT Goals(Current goals can be found in the care plan section) Acute Rehab OT Goals Patient Stated Goal: daughters goal for pt to return home OT Goal Formulation: With patient/family Time For Goal Achievement: 11/14/21 Potential to Achieve Goals: Good ADL Goals Pt Will Perform Grooming: with min assist;standing (c LRAD PRN) Pt Will Perform Lower Body Dressing: with min assist;sit to/from stand (c LRAD PRN) Pt Will Transfer to Toilet: with min guard assist;ambulating;bedside commode (c LRAD PRN)  OT Frequency: Min 3X/week   Barriers to D/C:            Co-evaluation              AM-PAC OT "6 Clicks" Daily Activity     Outcome Measure Help from another person eating meals?: A Little Help from another person taking care of personal grooming?: A Little Help from another person toileting, which includes using toliet, bedpan, or urinal?: A Lot Help from another person bathing (including washing, rinsing, drying)?: A Lot Help from another person to put on and taking off regular upper body clothing?: A Little Help from another person to put on and taking off regular lower body clothing?: A Lot 6 Click Score: 15   End of Session    Activity Tolerance: Patient tolerated treatment well Patient left:  in bed;with call bell/phone within reach;with family/visitor present  OT Visit Diagnosis: Other abnormalities of gait and mobility (R26.89)                Time: 8916-9450 OT Time Calculation (min): 21 min Charges:  OT General Charges $OT Visit: 1 Visit OT Evaluation $OT Eval Low Complexity: 1 Low OT Treatments $Self Care/Home Management : 8-22 mins  Dessie Coma, M.S. OTR/L  10/31/21, 5:22 PM  ascom [phone]

## 2021-10-31 NOTE — ED Notes (Signed)
ED TO INPATIENT HANDOFF REPORT  ED Nurse Name and Phone #: Darnelle Maffucci [phone]  E Name/Age/Gender Linda [name] 75 y.o. female Room/Bed: ED13A/ED13A  Code Status   Code Status: Full Code  Home/SNF/Other Home Incomprehensible speech, not oriented.  Is this baseline? No , daughter states that pt was answering questions slowly and slurred, but no her speech is worse.    Triage Complete: Triage complete  Chief Complaint Stroke [facility]) [I63.9]  Triage Note Pt presents via acems with c/o aphasia. Per husband, pt last known normal was 9pm last night. When husband went to bed, patient was reported to be normal, but when husband awoke patient could not get words out. Pt husband reports patient has hx of stroke. Patient alert but still with aphasia upon arrival to emergency room. Pt taken straight to CT upon arrival.   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;  Service: Ophthalmology;   Laterality: Left;   CATARACT EXTRACTION W/PHACO Right 06/14/2018   Procedure: CATARACT EXTRACTION PHACO AND INTRAOCULAR LENS PLACEMENT ([place])  RIGHT;  Surgeon: Leandrew Koyanagi, MD;  Location: [place];  Service: Ophthalmology;  Laterality: Right;   CEREBRAL ANEURYSM REPAIR       A IV Location/Drains/Wounds Patient Lines/Drains/Airways Status     Active Line/Drains/Airways     Name Placement date Placement time Site Days   Peripheral IV 10/31/21 20 G Left Antecubital 10/31/21  0620  Antecubital  less than 1   Peripheral IV 10/31/21 20 G Anterior;Proximal;Right Forearm 10/31/21  1258  Forearm  less than 1   Incision (Closed) 04/27/18 Eye 04/27/18  1135  -- 1283   Incision (Closed) 06/14/18 Eye Right 06/14/18  1033  -- 1235            Intake/Output Last 24 hours  Intake/Output Summary (Last 24 hours) at 10/31/2021 1551 Last data filed at 10/31/2021 1511 Gross per 24 hour  Intake 439.5 ml  Output --  Net 439.5 ml    Labs/Imaging Results for orders placed or performed during the hospital encounter of 10/31/21 (from the past 48 hour(s))  Resp Panel by RT-PCR (Flu A&B, Covid) Nasopharyngeal Swab     Status: None   Collection Time: 10/31/21 12:48 AM   Specimen: Nasopharyngeal Swab; Nasopharyngeal(NP) swabs in vial transport medium  Result Value Ref Range   SARS Coronavirus 2 by RT PCR NEGATIVE NEGATIVE    Comment: (NOTE) SARS-CoV-2 target nucleic acids are  NOT DETECTED.  The SARS-CoV-2 RNA is generally detectable in upper respiratory specimens during the acute phase of infection. The lowest concentration of SARS-CoV-2 viral copies this assay can detect is 138 copies/mL. A negative result does not preclude SARS-Cov-2 infection and should not be used as the sole basis for treatment or other patient management decisions. A negative result may occur with  improper specimen collection/handling, submission of specimen other than nasopharyngeal swab, presence of viral  mutation(s) within the areas targeted by this assay, and inadequate number of viral copies(<138 copies/mL). A negative result must be combined with clinical observations, patient history, and epidemiological information. The expected result is Negative.  Fact Sheet for Patients:  EntrepreneurPulse.com.au  Fact Sheet for Healthcare Providers:  IncredibleEmployment.be  This test is no t yet approved or cleared by the [place] FDA and  has been authorized for detection and/or diagnosis of SARS-CoV-2 by FDA under an Emergency Use Authorization (EUA). This EUA will remain  in effect (meaning this test can be used) for the duration of the COVID-19 declaration under Section 564(b)(1) of the Act, 21 U.S.C.section 360bbb-3(b)(1), unless the authorization is terminated  or revoked sooner.       Influenza A by PCR NEGATIVE NEGATIVE   Influenza B by PCR NEGATIVE NEGATIVE    Comment: (NOTE) The Xpert Xpress SARS-CoV-2/FLU/RSV plus assay is intended as an aid in the diagnosis of influenza from Nasopharyngeal swab specimens and should not be used as a sole basis for treatment. Nasal washings and aspirates are unacceptable for Xpert Xpress SARS-CoV-2/FLU/RSV testing.  Fact Sheet for Patients: EntrepreneurPulse.com.au  Fact Sheet for Healthcare Providers: IncredibleEmployment.be  This test is not yet approved or cleared by the [place] FDA and has been authorized for detection and/or diagnosis of SARS-CoV-2 by FDA under an Emergency Use Authorization (EUA). This EUA will remain in effect (meaning this test can be used) for the duration of the COVID-19 declaration under Section 564(b)(1) of the Act, 21 U.S.C. section 360bbb-3(b)(1), unless the authorization is terminated or revoked.  Performed at [facility], [address] [place], [place] [address]   CBG monitoring, ED     Status: Abnormal    Collection Time: 10/31/21  6:22 AM  Result Value Ref Range   Glucose-Capillary 127 (H) 70 - 99 mg/dL    Comment: Glucose reference range applies only to samples taken after fasting for at least 8 hours.  Protime-INR     Status: None   Collection Time: 10/31/21  6:48 AM  Result Value Ref Range   Prothrombin Time 14.0 11.4 - 15.2 seconds   INR 1.1 0.8 - 1.2    Comment: (NOTE) INR goal varies based on device and disease states. Performed at [facility], [address] [place], [place] [address]   APTT     Status: None   Collection Time: 10/31/21  6:48 AM  Result Value Ref Range   aPTT 28 24 - 36 seconds    Comment: Performed at [facility], [address] [place], [place] [address]  CBC     Status: Abnormal   Collection Time: 10/31/21  6:48 AM  Result Value Ref Range   WBC 14.4 (H) 4.0 - 10.5 K/uL   RBC 3.33 (L) 3.87 - 5.11 MIL/uL   Hemoglobin 10.5 (L) 12.0 - 15.0 g/dL   HCT 33.0 (L) 36.0 - 46.0 %   MCV 99.1 80.0 - 100.0 fL   MCH 31.5 26.0 - 34.0 pg   MCHC 31.8 30.0 -  36.0 g/dL   RDW 13.4 11.5 - 15.5 %   Platelets 291 150 - 400 K/uL   nRBC 0.0 0.0 - 0.2 %    Comment: Performed at [facility], [address] [place], [place] [address]  Differential     Status: Abnormal   Collection Time: 10/31/21  6:48 AM  Result Value Ref Range   Neutrophils Relative % 86 %   Neutro Abs 12.4 (H) 1.7 - 7.7 K/uL   Lymphocytes Relative 7 %   Lymphs Abs 1.1 0.7 - 4.0 K/uL   Monocytes Relative 6 %   Monocytes Absolute 0.8 0.1 - 1.0 K/uL   Eosinophils Relative 0 %   Eosinophils Absolute 0.0 0.0 - 0.5 K/uL   Basophils Relative 0 %   Basophils Absolute 0.0 0.0 - 0.1 K/uL   Immature Granulocytes 1 %   Abs Immature Granulocytes 0.09 (H) 0.00 - 0.07 K/uL    Comment: Performed at [facility], [address] [place], [place] [address]  Comprehensive metabolic panel     Status: Abnormal   Collection Time: 10/31/21  6:48 AM  Result Value Ref Range    Sodium 136 135 - 145 mmol/L   Potassium 5.1 3.5 - 5.1 mmol/L   Chloride 106 98 - 111 mmol/L   CO2 20 (L) 22 - 32 mmol/L   Glucose, Bld 118 (H) 70 - 99 mg/dL    Comment: Glucose reference range applies only to samples taken after fasting for at least 8 hours.   BUN 52 (H) 8 - 23 mg/dL   Creatinine, Ser 2.94 (H) 0.44 - 1.00 mg/dL   Calcium 9.2 8.9 - 10.3 mg/dL   Total Protein 8.2 (H) 6.5 - 8.1 g/dL   Albumin 3.9 3.5 - 5.0 g/dL   AST 59 (H) 15 - 41 U/L   ALT 23 0 - 44 U/L   Alkaline Phosphatase 48 38 - 126 U/L   Total Bilirubin 0.5 0.3 - 1.2 mg/dL   GFR, Estimated 16 (L) >60 mL/min    Comment: (NOTE) Calculated using the CKD-EPI Creatinine Equation (2021)    Anion gap 10 5 - 15    Comment: Performed at [facility], [address] [place], [place] [address]  TSH     Status: Abnormal   Collection Time: 10/31/21  6:48 AM  Result Value Ref Range   TSH 6.691 (H) 0.350 - 4.500 uIU/mL    Comment: Performed by a 3rd Generation assay with a functional sensitivity of <=0.01 uIU/mL. Performed at [facility], [address] [place], [place] [address]   CK     Status: Abnormal   Collection Time: 10/31/21  6:48 AM  Result Value Ref Range   Total CK 2,335 (H) 38 - 234 U/L    Comment: Performed at [facility], [address] [place], [place] [address]  Brain natriuretic peptide     Status: Abnormal   Collection Time: 10/31/21  6:48 AM  Result Value Ref Range   B Natriuretic Peptide 502.7 (H) 0.0 - 100.0 pg/mL    Comment: Performed at [facility], [address] [place], [place] [address]  Urinalysis, Complete w Microscopic Urine, Catheterized     Status: Abnormal   Collection Time: 10/31/21  9:46 AM  Result Value Ref Range   Color, Urine YELLOW (A) YELLOW   APPearance HAZY (A) CLEAR   Specific Gravity, Urine 1.011 1.005 - 1.030   pH 5.0 5.0 - 8.0   Glucose, UA  NEGATIVE NEGATIVE mg/dL   Hgb urine dipstick LARGE (A) NEGATIVE   Bilirubin Urine  NEGATIVE NEGATIVE   Ketones, ur NEGATIVE NEGATIVE mg/dL   Protein, ur 30 (A) NEGATIVE mg/dL   Nitrite NEGATIVE NEGATIVE   Leukocytes,Ua TRACE (A) NEGATIVE   RBC / HPF 0-5 0 - 5 RBC/hpf   WBC, UA 6-10 0 - 5 WBC/hpf   Bacteria, UA MANY (A) NONE SEEN   Squamous Epithelial / LPF NONE SEEN 0 - 5   Mucus PRESENT    Hyaline Casts, UA PRESENT     Comment: Performed at [facility], [address] [place], [place] [address]  Lactic acid, plasma     Status: None   Collection Time: 10/31/21 12:49 PM  Result Value Ref Range   Lactic Acid, Venous 1.6 0.5 - 1.9 mmol/L    Comment: Performed at [facility], [address] [place], [place] [address]  Procalcitonin     Status: None   Collection Time: 10/31/21 12:49 PM  Result Value Ref Range   Procalcitonin 0.79 ng/mL    Comment:        Interpretation: PCT > 0.5 ng/mL and <= 2 ng/mL: Systemic infection (sepsis) is possible, but other conditions are known to elevate PCT as well. (NOTE)       Sepsis PCT Algorithm           Lower Respiratory Tract                                      Infection PCT Algorithm    ----------------------------     ----------------------------         PCT < 0.25 ng/mL                PCT < 0.10 ng/mL          Strongly encourage             Strongly discourage   discontinuation of antibiotics    initiation of antibiotics    ----------------------------     -----------------------------       PCT 0.25 - 0.50 ng/mL            PCT 0.10 - 0.25 ng/mL               OR       >80% decrease in PCT            Discourage initiation of                                            antibiotics      Encourage discontinuation           of antibiotics    ----------------------------     -----------------------------         PCT >= 0.50 ng/mL              PCT 0.26 - 0.50 ng/mL                AND       <80% decrease in PCT             Encourage initiation of  antibiotics       Encourage continuation           of antibiotics    ----------------------------     -----------------------------        PCT >= 0.50 ng/mL                  PCT > 0.50 ng/mL               AND         increase in PCT                  Strongly encourage                                      initiation of antibiotics    Strongly encourage escalation           of antibiotics                                     -----------------------------                                           PCT <= 0.25 ng/mL                                                 OR                                        > 80% decrease in PCT                                      Discontinue / Do not initiate                                             antibiotics  Performed at [facility], [address] [place], [place] [address]    MR ANGIO HEAD WO CONTRAST  Result Date: 10/31/2021 CLINICAL DATA:  Neuro deficit, acute, stroke suspected EXAM: MRI HEAD WITHOUT CONTRAST MRA HEAD WITHOUT CONTRAST TECHNIQUE: Multiplanar, multi-echo pulse sequences of the brain and surrounding structures were acquired without intravenous contrast. Angiographic images of the Circle of Willis were acquired using MRA technique without intravenous contrast. COMPARISON:  10/15/2021 FINDINGS: MRI HEAD FINDINGS Brain: No acute infarction or hemorrhage. Encephalomalacia and gliosis in the left cerebral hemisphere with involvement of frontal, parietal, temporal, and occipital lobes. Associated chronic blood products particularly in the temporal lobe. Ex vacuo dilatation of the left lateral ventricle. Chronic small vessel infarcts of the central gray nuclei, central white matter, pons, and cerebellum. Few additional scattered foci of susceptibility likely reflecting chronic microhemorrhages. Other stable confluent areas of T2 hyperintensity in the supratentorial and pontine white matter are nonspecific but probably reflect advanced  chronic microvascular ischemic changes. Prominence of  the ventricles and sulci reflects parenchymal volume loss. Vascular: Susceptibility artifact related to left MCA aneurysm coiling. Skull and upper cervical spine: Marrow signal is within normal limits. Sinuses/Orbits: Chronic left sphenoid sinusitis. Bilateral lens replacements. Other: Suprasellar mass along pituitary infundibulum is not well evaluated on this study. Persistent bilateral mastoid effusions. MRA HEAD FINDINGS Anterior circulation: Intracranial internal carotid arteries are patent with atherosclerotic irregularity. Anterior cerebral arteries are patent. Middle cerebral arteries are patent. There is susceptibility artifact at site of left MCA bifurcation aneurysm coiling. No evidence of recurrence. Posterior circulation: Intracranial vertebral arteries are patent. The right vertebral artery terminates as a PICA. Basilar artery is patent. Posterior cerebral arteries are patent. Bilateral posterior communicating arteries are present. Fetal origin of the left PCA. IMPRESSION: No acute infarction or other acute abnormality. Stable chronic findings compared to recent prior study. No significant vascular abnormality. 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 Electronically Signed   By: Macy Mis M.D.   On: 10/31/2021 12:42   MR BRAIN WO CONTRAST  Result Date: 10/31/2021 CLINICAL DATA:  Neuro deficit, acute, stroke suspected EXAM: MRI HEAD WITHOUT CONTRAST MRA HEAD WITHOUT CONTRAST TECHNIQUE: Multiplanar, multi-echo pulse sequences of the brain and surrounding structures were acquired without intravenous contrast. Angiographic images of the Circle of Willis were acquired using MRA technique without intravenous contrast. COMPARISON:  10/15/2021 FINDINGS: MRI HEAD FINDINGS Brain: No acute infarction or hemorrhage. Encephalomalacia and gliosis in the left cerebral hemisphere with involvement of frontal, parietal,  temporal, and occipital lobes. Associated chronic blood products particularly in the temporal lobe. Ex vacuo dilatation of the left lateral ventricle. Chronic small vessel infarcts of the central gray nuclei, central white matter, pons, and cerebellum. Few additional scattered foci of susceptibility likely reflecting chronic microhemorrhages. Other stable confluent areas of T2 hyperintensity in the supratentorial and pontine white matter are nonspecific but probably reflect advanced chronic microvascular ischemic changes. Prominence of the ventricles and sulci reflects parenchymal volume loss. Vascular: Susceptibility artifact related to left MCA aneurysm coiling. Skull and upper cervical spine: Marrow signal is within normal limits. Sinuses/Orbits: Chronic left sphenoid sinusitis. Bilateral lens replacements. Other: Suprasellar mass along pituitary infundibulum is not well evaluated on this study. Persistent bilateral mastoid effusions. MRA HEAD FINDINGS Anterior circulation: Intracranial internal carotid arteries are patent with atherosclerotic irregularity. Anterior cerebral arteries are patent. Middle cerebral arteries are patent. There is susceptibility artifact at site of left MCA bifurcation aneurysm coiling. No evidence of recurrence. Posterior circulation: Intracranial vertebral arteries are patent. The right vertebral artery terminates as a PICA. Basilar artery is patent. Posterior cerebral arteries are patent. Bilateral posterior communicating arteries are present. Fetal origin of the left PCA. IMPRESSION: No acute infarction or other acute abnormality. Stable chronic findings compared to recent prior study. No significant vascular abnormality. 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 Electronically Signed   By: Macy Mis M.D.   On: 10/31/2021 12:42   US Carotid Bilateral (at [facility] and [facility] only)  Result Date: 10/31/2021 CLINICAL DATA:  Stroke, hyperlipidemia,  former smoker EXAM: BILATERAL CAROTID DUPLEX ULTRASOUND TECHNIQUE: Pearline Cables scale imaging, color Doppler and duplex ultrasound were performed of bilateral carotid and vertebral arteries in the neck. COMPARISON:  None. FINDINGS: Criteria: Quantification of carotid stenosis is based on velocity parameters that correlate the residual internal carotid diameter with NASCET-based stenosis levels, using the diameter of the distal internal carotid lumen as the denominator for stenosis measurement. The following velocity measurements were obtained:  RIGHT ICA: 71/27 cm/sec CCA: 43/32 cm/sec SYSTOLIC ICA/CCA RATIO:  1.0 ECA: 47 cm/sec LEFT ICA: 127/36 cm/sec CCA: 95/18 cm/sec SYSTOLIC ICA/CCA RATIO:  2.8 ECA: 46 cm/sec RIGHT CAROTID ARTERY: Moderate mixed echogenicity carotid bifurcation atherosclerosis. Despite this, no significant ICA stenosis, velocity elevation, turbulent flow. Degree of narrowing less than 50% by ultrasound criteria. RIGHT VERTEBRAL ARTERY:  Normal antegrade flow LEFT CAROTID ARTERY: Similar moderate mixed echogenicity irregular bifurcation atherosclerosis. Negative for significant stenosis, velocity elevation, turbulent flow. Degree of narrowing also less than 50% by ultrasound criteria. LEFT VERTEBRAL ARTERY:  Normal antegrade flow IMPRESSION: Bilateral carotid atherosclerosis. Negative for significant ICA stenosis. Degree of narrowing less than 50% bilaterally by ultrasound criteria. Patent antegrade vertebral flow bilaterally Electronically Signed   By: Jerilynn Mages.  Shick M.D.   On: 10/31/2021 14:25   DG Chest Portable 1 View  Result Date: 10/31/2021 CLINICAL DATA:  75 year old female code stroke presentation. Cough for 1 week. EXAM: PORTABLE CHEST 1 VIEW COMPARISON:  Chest radiographs 03/17/2012. FINDINGS: Portable AP upright view at 0652 hours. Chronic large lung volumes. Mild cardiomegaly appears new since 2013. Other mediastinal contours are within normal limits. Visualized tracheal air column is within  normal limits. Chronic but increased and coarse bilateral pulmonary interstitial opacity, fairly symmetric and with some basilar predominance. No pneumothorax, pleural effusion or confluent opacity. No acute osseous abnormality identified. Negative visible bowel gas. IMPRESSION: Chronic hyperinflation with acute on chronic bilateral pulmonary interstitial opacity. Top differential considerations include viral/atypical respiratory infection, pulmonary edema, progressed chronic lung disease. Mild cardiomegaly.  No pleural effusion. Electronically Signed   By: Genevie Ann M.D.   On: 10/31/2021 07:14   ECHOCARDIOGRAM COMPLETE  Result Date: 10/31/2021    ECHOCARDIOGRAM REPORT   Patient Name:   SAIDI SANTACROCE Date of Exam: 10/31/2021 Medical Rec #:  [id number]      Height:       66.0 in Accession #:    [id number]     Weight:       160.3 lb Date of Birth:  16-Aug-1946      BSA:          1.820 m Patient Age:    80 years       BP:           145/87 mmHg Patient Gender: F              HR:           85 bpm. Exam Location:  [facility] Procedure: 2D Echo, Cardiac Doppler and Color Doppler Indications:     Stroke I63.9  History:         Patient has no prior history of Echocardiogram examinations.                  Stroke; Risk Factors:Dyslipidemia. CKD.  Sonographer:     Sherrie Sport Referring Phys:  Unknown Foley NIU Diagnosing Phys: Donnelly Angelica  Sonographer Comments: Suboptimal apical window. IMPRESSIONS  1. Left ventricular ejection fraction, by estimation, is 30 to 35%. The left ventricle has moderately decreased function. The left ventricle demonstrates regional wall motion abnormalities (see scoring diagram/findings for description). Left ventricular  diastolic parameters are consistent with Grade I diastolic dysfunction (impaired relaxation).  2. Right ventricular systolic function is normal. The right ventricular size is normal.  3. The mitral valve is normal in structure. Mild mitral valve regurgitation. No evidence of mitral stenosis.   4. The aortic valve was not well visualized. Aortic valve regurgitation is not visualized. No  aortic stenosis is present. 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 FINDINGS  Left Ventricle: Left ventricular ejection fraction, by estimation, is 30 to 35%. The left ventricle has moderately decreased function. The left ventricle demonstrates regional wall motion abnormalities. The left ventricular internal cavity size was normal in size. There is no left ventricular hypertrophy. Left ventricular diastolic parameters are consistent with Grade I diastolic dysfunction (impaired relaxation).  LV Wall Scoring: The entire septum is akinetic. The basal anterior segment is hypokinetic. Right Ventricle: The right ventricular size is normal. No increase in right ventricular wall thickness. Right ventricular systolic function is normal. Left Atrium: Left atrial size was normal in size. Right Atrium: Right atrial size was normal in size. Pericardium: There is no evidence of pericardial effusion. Mitral Valve: The mitral valve is normal in structure. Mild mitral valve regurgitation. No evidence of mitral valve stenosis. MV peak gradient, 5.9 mmHg. The mean mitral valve gradient is 2.0 mmHg. Tricuspid Valve: The tricuspid valve is normal in structure. Tricuspid valve regurgitation is mild. Aortic Valve: The aortic valve was not well visualized. Aortic valve regurgitation is not visualized. No aortic stenosis is present. Aortic valve mean gradient measures 1.5 mmHg. Aortic valve peak gradient measures 3.3 mmHg. Aortic valve area, by VTI measures 4.42 cm. Pulmonic Valve: The pulmonic valve was not well visualized. Pulmonic valve regurgitation is not visualized. Aorta: The aortic root was not well visualized. IAS/Shunts: The interatrial septum was not assessed.  LEFT VENTRICLE PLAX 2D LVIDd:         4.00 cm     Diastology LVIDs:         3.40 cm     LV e' medial:    3.92  cm/s LV PW:         1.20 cm     LV E/e' medial:  12.6 LV IVS:        0.80 cm     LV e' lateral:   5.00 cm/s LVOT diam:     2.10 cm     LV E/e' lateral: 9.9 LV SV:         63 LV SV Index:   35 LVOT Area:     3.46 cm  LV Volumes (MOD) LV vol d, MOD A2C: 84.5 ml LV vol d, MOD A4C: 64.1 ml LV vol s, MOD A2C: 71.1 ml LV vol s, MOD A4C: 34.9 ml LV SV MOD A2C:     13.4 ml LV SV MOD A4C:     64.1 ml LV SV MOD BP:      24.6 ml RIGHT VENTRICLE RV Basal diam:  3.50 cm RV S prime:     10.30 cm/s TAPSE (M-mode): 3.4 cm LEFT ATRIUM             Index        RIGHT ATRIUM           Index LA diam:        3.00 cm 1.65 cm/m   RA Area:     10.40 cm LA Vol (A2C):   48.2 ml 26.48 ml/m  RA Volume:   21.10 ml  11.59 ml/m LA Vol (A4C):   67.4 ml 37.03 ml/m LA Biplane Vol: 62.3 ml 34.23 ml/m  AORTIC VALVE                    PULMONIC VALVE AV Area (Vmax):    3.78 cm     PV Vmax:  0.59 m/s AV Area (Vmean):   3.81 cm     PV Vmean:       38.300 cm/s AV Area (VTI):     4.42 cm     PV VTI:         0.083 m AV Vmax:           90.35 cm/s   PV Peak grad:   1.4 mmHg AV Vmean:          59.950 cm/s  PV Mean grad:   1.0 mmHg AV VTI:            0.144 m      RVOT Peak grad: 2 mmHg AV Peak Grad:      3.3 mmHg AV Mean Grad:      1.5 mmHg LVOT Vmax:         98.60 cm/s LVOT Vmean:        66.000 cm/s LVOT VTI:          0.183 m LVOT/AV VTI ratio: 1.28  AORTA Ao Root diam: 2.70 cm MITRAL VALVE               TRICUSPID VALVE MV Area (PHT): 5.34 cm    TR Peak grad:   43.8 mmHg MV Area VTI:   3.88 cm    TR Vmax:        331.00 cm/s MV Peak grad:  5.9 mmHg MV Mean grad:  2.0 mmHg    SHUNTS MV Vmax:       1.21 m/s    Systemic VTI:  0.18 m MV Vmean:      70.4 cm/s   Systemic Diam: 2.10 cm MV Decel Time: 142 msec    Pulmonic VTI:  0.116 m MV E velocity: 49.30 cm/s MV A velocity: 84.80 cm/s MV E/A ratio:  0.58 Donnelly Angelica Electronically signed by Donnelly Angelica Signature Date/Time: 10/31/2021/3:14:43 PM    Final    CT HEAD CODE STROKE WO CONTRAST  Result  Date: 10/31/2021 CLINICAL DATA:  Code stroke.  75 year old female with aphasia. EXAM: CT HEAD WITHOUT CONTRAST TECHNIQUE: Contiguous axial images were obtained from the base of the skull through the vertex without intravenous contrast. COMPARISON:  Recent brain MRI 10/15/2021.  Head CT 06/27/2020. 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 Fairly extensive chronic encephalomalacia in the left hemisphere involving both the MCA and PCA territories. Superimposed advanced bilateral cerebral white matter hypodensity. Small area of chronic encephalomalacia in the anterior right MCA territory. Advanced small vessel disease throughout the bilateral deep gray nuclei. And chronic small vessel disease also in the brainstem and bilateral cerebellar hemispheres. Ex vacuo ventricular enlargement is stable from earlier this month. No superimposed midline shift, mass effect, intracranial hemorrhage or evidence of cortically based acute infarction. 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 Vascular: Calcified atherosclerosis at the skull base. 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 No suspicious intracranial vascular hyperdensity. Skull: No acute osseous abnormality identified. Sinuses/Orbits: Chronic sinusitis with scattered mucoperiosteal thickening has not significantly changed from last year. Mild right mastoid effusion not significantly changed. Other: No acute orbit or scalp soft tissue finding. ASPECTS [facility] Stroke Program Early CT Score) Total score (0-10 with 10 being normal): 10 (chronic encephalomalacia) IMPRESSION: 1. Severe chronic ischemic disease. No acute cortically based infarct or acute intracranial hemorrhage identified. 2. Previous left MCA aneurysm coil embolization. Study discussed by telephone with Dr. Pryor Curia on 10/31/2021 at 06:36 .  Electronically Signed   By: Genevie Ann M.D.   On: 10/31/2021 06:37     Pending Labs Unresulted Labs (From admission, onward)     Start     Ordered   11/01/21 0500  Hemoglobin A1c  (Labs)  Tomorrow morning,   STAT        10/31/21 1053   11/01/21 0500  Lipid panel  (Labs)  Tomorrow morning,   STAT       Comments: Fasting    10/31/21 1053   10/31/21 1048  Expectorated Sputum Assessment w Gram Stain, Rflx to Resp Cult  Once,   STAT        10/31/21 1048   10/31/21 1048  Strep pneumoniae urinary antigen  Once,   STAT        10/31/21 1048   10/31/21 1048  Legionella Pneumophila Serogp 1 Ur Ag  Once,   STAT        10/31/21 1048   10/31/21 1047  Respiratory (~20 pathogens) panel by PCR  (Respiratory panel by PCR (~20 pathogens, ~24 hr TAT)  w precautions)  Once,   STAT        10/31/21 1046   10/31/21 1045  Culture, blood (x 2)  BLOOD CULTURE X 2,   STAT     Comments: INITIATE ANTIBIOTICS WITHIN 1 HOUR AFTER BLOOD CULTURES DRAWN.  If unable to obtain blood cultures, call MD immediately regarding antibiotic instructions.    10/31/21 1044   10/31/21 1045  Lactic acid, plasma  STAT Now then every 2 hours,   STAT      10/31/21 1044            Vitals/Pain Today's Vitals   10/31/21 1117 10/31/21 1300 10/31/21 1332 10/31/21 1539  BP: 130/87 (!) 131/93 (!) 145/87 (!) 141/82  Pulse: 87 85 85 85  Resp: 20 (!) 22 (!) 22 (!) 21  Temp: 98.3 F (36.8 C)  97.6 F (36.4 C) 98.4 F (36.9 C)  TempSrc: Oral  Oral Oral  SpO2: 100% 95% 99% 99%  Weight:      Height:        Isolation Precautions Droplet precaution  Medications Medications  0.9 %  sodium chloride infusion ( Intravenous Rate/Dose Change 10/31/21 1537)  albuterol (PROVENTIL) (2.5 MG/3ML) 0.083% nebulizer solution 2.5 mg (has no administration in time range)  dextromethorphan-guaiFENesin (MUCINEX DM) 30-600 MG per 12 hr tablet 1 tablet (has no administration in time range)  cefTRIAXone (ROCEPHIN) 1 g in sodium chloride 0.9 % 100 mL IVPB (0 g Intravenous Stopped 10/31/21 1346)  azithromycin  (ZITHROMAX) 500 mg in sodium chloride 0.9 % 250 mL IVPB (0 mg Intravenous Stopped 10/31/21 1511)  metroNIDAZOLE (FLAGYL) IVPB 500 mg (0 mg Intravenous Stopped 10/31/21 1511)  aspirin EC tablet 81 mg (81 mg Oral Not Given 10/31/21 1210)  clopidogrel (PLAVIX) tablet 75 mg (75 mg Oral Not Given 10/31/21 1211)  ondansetron (ZOFRAN) injection 4 mg (has no administration in time range)  hydrALAZINE (APRESOLINE) injection 5 mg (has no administration in time range)  atorvastatin (LIPITOR) tablet 40 mg (has no administration in time range)  citalopram (CELEXA) tablet 20 mg (20 mg Oral Not Given 10/31/21 1211)  mirabegron ER (MYRBETRIQ) tablet 50 mg (50 mg Oral Not Given 10/31/21 1212)   stroke: mapping our early stages of recovery book (has no administration in time range)  acetaminophen (TYLENOL) tablet 650 mg (has no administration in time range)    Or  acetaminophen (TYLENOL) 160 MG/5ML solution 650 mg (  has no administration in time range)    Or  acetaminophen (TYLENOL) suppository 650 mg (has no administration in time range)  senna-docusate (Senokot-S) tablet 1 tablet (has no administration in time range)  heparin injection 5,000 Units (5,000 Units Subcutaneous Given 10/31/21 1325)  sodium chloride flush (NS) 0.9 % injection 3 mL (3 mLs Intravenous Given 10/31/21 0726)    Mobility non-ambulatory High fall risk   Focused Assessments Neuro Assessment Handoff:  Swallow screen pass? No    NIH Stroke Scale ( + Modified Stroke Scale Criteria)  Interval: Shift assessment Level of Consciousness (1a.)   : Alert, keenly responsive LOC Questions (1b. )   +: Answers neither question correctly LOC Commands (1c. )   + : Performs both tasks correctly Best Gaze (2. )  +: Normal Visual (3. )  +: No visual loss Facial Palsy (4. )    : Minor paralysis Motor Arm, Left (5a. )   +: No drift Motor Arm, Right (5b. )   +: No drift Motor Leg, Left (6a. )   +: No effort against gravity Motor Leg, Right (6b.  )   +: No effort against gravity Limb Ataxia (7. ): Present in two limbs Sensory (8. )   +: Mild-to-moderate sensory loss, patient feels pinprick is less sharp or is dull on the affected side, or there is a loss of superficial pain with pinprick, but patient is aware of being touched (uncomprehensible speech) Best Language (9. )   +: Mute, global aphasia Dysarthria (10. ): Severe dysarthria, patient's speech is so slurred as to be unintelligible in the absence of or out of proportion to any dysphasia, or is mute/anarthric Extinction/Inattention (11.)   +: Visual/tactile/auditory/spatial/personal inattention Modified SS Total  +: 13 Complete NIHSS TOTAL: 20 Last date known well: 10/30/21 Last time known well: 2100 Neuro Assessment: Exceptions to WDL (Unable to assess) Neuro Checks:   Initial (10/31/21 0620)  Last Documented NIHSS Modified Score: 13 (10/31/21 1541) Has TPA been given? No If patient is a Neuro Trauma and patient is going to OR before floor call report to [place] nurse: [phone] or [phone]   R Recommendations: See Admitting Provider Note  Report given to:   Additional Notes: Spl has seen pt and they have passed them for straw use and meds in applesauce.

## 2021-11-01 DIAGNOSIS — I5181 Takotsubo syndrome: Secondary | ICD-10-CM | POA: Diagnosis present

## 2021-11-01 DIAGNOSIS — J189 Pneumonia, unspecified organism: Secondary | ICD-10-CM | POA: Diagnosis not present

## 2021-11-01 DIAGNOSIS — I5022 Chronic systolic (congestive) heart failure: Secondary | ICD-10-CM | POA: Diagnosis not present

## 2021-11-01 DIAGNOSIS — J9601 Acute respiratory failure with hypoxia: Secondary | ICD-10-CM | POA: Diagnosis not present

## 2021-11-01 LAB — CBC
HCT: 29.9 % — ABNORMAL LOW (ref 36.0–46.0)
Hemoglobin: 9.8 g/dL — ABNORMAL LOW (ref 12.0–15.0)
MCH: 31.7 pg (ref 26.0–34.0)
MCHC: 32.8 g/dL (ref 30.0–36.0)
MCV: 96.8 fL (ref 80.0–100.0)
Platelets: 255 10*3/uL (ref 150–400)
RBC: 3.09 MIL/uL — ABNORMAL LOW (ref 3.87–5.11)
RDW: 13.5 % (ref 11.5–15.5)
WBC: 14 10*3/uL — ABNORMAL HIGH (ref 4.0–10.5)
nRBC: 0 % (ref 0.0–0.2)

## 2021-11-01 LAB — LIPID PANEL
Cholesterol: 120 mg/dL (ref 0–200)
HDL: 43 mg/dL (ref >40)
LDL Cholesterol: 63 mg/dL (ref 0–99)
Total CHOL/HDL Ratio: 2.8 RATIO
Triglycerides: 71 mg/dL (ref <150)
VLDL: 14 mg/dL (ref 0–40)

## 2021-11-01 LAB — HEMOGLOBIN A1C
Hgb A1c MFr Bld: 5.4 % (ref 4.8–5.6)
Mean Plasma Glucose: 108.28 mg/dL

## 2021-11-01 LAB — HEPARIN LEVEL (UNFRACTIONATED)
Heparin Unfractionated: 0.43 IU/mL (ref 0.30–0.70)
Heparin Unfractionated: 0.65 IU/mL (ref 0.30–0.70)
Heparin Unfractionated: 0.95 IU/mL — ABNORMAL HIGH (ref 0.30–0.70)

## 2021-11-01 LAB — CK: Total CK: 3287 U/L — ABNORMAL HIGH (ref 38–234)

## 2021-11-01 LAB — TROPONIN I (HIGH SENSITIVITY): Troponin I (High Sensitivity): 3743 ng/L (ref <18)

## 2021-11-01 LAB — T4, FREE: Free T4: 0.81 ng/dL (ref 0.61–1.12)

## 2021-11-01 MED ORDER — METOPROLOL SUCCINATE ER 25 MG PO TB24
12.5000 mg | ORAL_TABLET | Freq: Every day | ORAL | Status: DC
  Administered 2021-11-01 – 2021-11-05 (×5): 12.5 mg via ORAL
  Filled 2021-11-01 (×5): qty 1

## 2021-11-01 MED ORDER — SODIUM CHLORIDE 0.9 % IV SOLN: INTRAVENOUS | Status: DC

## 2021-11-01 MED ORDER — HYDROCODONE-ACETAMINOPHEN 5-325 MG PO TABS
1.0000 | ORAL_TABLET | ORAL | Status: DC | PRN
  Administered 2021-11-01: 1 via ORAL
  Filled 2021-11-01: qty 1

## 2021-11-01 MED ORDER — HYDRALAZINE HCL 20 MG/ML IJ SOLN: 5.0000 mg | INTRAMUSCULAR | Status: DC | PRN

## 2021-11-01 NOTE — Assessment & Plan Note (Signed)
CK of 3287.  Monitor.  Hold statin

## 2021-11-01 NOTE — Assessment & Plan Note (Addendum)
Echo shows EF of 30 to 35%.  Continue metoprolol, aspirin.  Heparin infusion till tomorrow.  Unable to restart losartan due to renal dysfunction.  Cannot give Lipitor due to rhabdo

## 2021-11-01 NOTE — Progress Notes (Signed)
Progress Note    Linda [name]   YKD:[id number]  DOB: 03/27/1946  DOA: 10/31/2021     1 Date of Service: 11/01/2021   Clinical Course  75 y.o. female with medical history significant of stroke with aphasia, hypertension, hyperlipidemia, depression with anxiety, dementia, CKD-IV, sCHF, brain aneurysm, former smoker, who presents with worsening facial and difficulty walking.   11/19: Negative stroke work-up and her speech is back to baseline per family.  Her right side weakness is resolved. Cardiology recommends 48 hours of heparin infusion with uncertainty of possible MI versus Takotsubo cardiomyopathy.  Worsening CK.  Nephrology consultation   Assessment and Plan * Takotsubo cardiomyopathy Elevated troponin.  Echo showing akinetic anteroseptal wall and apex.  Troponin peaked at 6500 and now trending down.  Patient is not having any chest pain.  Cardiology recommends conservative management with heparin infusion 48 hours.  Continue low-dose metoprolol.  Due to her renal dysfunction ischemic evaluation will be deferred while here in the hospital per cardiology.  Cannot give statin due to elevated CK/rhabdo  TIA (transient ischemic attack) Symptoms resolved.  Continue aspirin and statin.  Added Plavix.  Stroke work-up essentially negative including MRI of the brain, carotid Dopplers and echo  UTI (urinary tract infection) On Rocephin.  Pending urine culture  CKD (chronic kidney disease), stage IV (HCC) Baseline creatinine around 1.6-1.9.  Admission creatinine of 2.94 with BUN of 52.  Continue gentle hydration.  Will consult nephrology  Sepsis [facility]) Due to atypical pneumonia and or possible aspiration.  Normal lactic acid.  Gentle hydration  Rhabdomyolysis CK of 3287.  Monitor.  Hold statin  Atypical pneumonia Continue Rocephin, Flagyl and Zithromax  Depression with anxiety Continue Celexa  Chronic systolic CHF (congestive heart failure) (HCC) Echo shows EF of 30 to 35%.   Continue metoprolol, aspirin.  Heparin infusion till tomorrow.  Unable to restart losartan due to renal dysfunction.  Cannot give Lipitor due to rhabdo  Essential hypertension Continue metoprolol, as needed hydralazine  Hyperlipidemia Lipitor on hold due to elevated CK/rhabdo     Subjective:  Patient adamant in wanting to go home.  Husband at bedside.  She denies any new symptoms but seems short of breath  Objective Vitals:   10/31/21 2356 11/01/21 0423 11/01/21 0807 11/01/21 1108  BP: (!) 114/95 (!) 126/58 118/69 130/67  Pulse: 84 84 82 87  Resp: 20 18 18 18   Temp:  98.8 F (37.1 C) 98.1 F (36.7 C) 98.4 F (36.9 C)  TempSrc:    Oral  SpO2: 99% 96% 100% 99%  Weight:      Height:       72.7 kg  Vital signs were reviewed and unremarkable.   Exam Physical Exam   General: Not in acute distress HEENT:       Eyes: PERRL, EOMI, no scleral icterus.       Neck: No JVD, no bruit, no mass felt. Heme: No neck lymph node enlargement. Cardiac: S1/S2, RRR, No murmurs, No gallops or rubs. Respiratory: Rhonchi at the bases.  Decreased breath sounds.  No crackles or rales GI: Soft, nondistended, nontender, no rebound pain, no organomegaly, BS present. GU: No hematuria Ext: has trace leg edema bilaterally. 1+DP/PT pulse bilaterally. Musculoskeletal: No joint deformities, No joint redness or warmth Skin: No rashes.  Neuro: Alert, cranial nerves II-XII grossly intact. Muscle strength 4/5 in right let and 5/5 in all other extremities. Psych:  She looks emotional and somewhat anxious  Labs / Other Information My review of labs,  imaging, notes and other tests is significant for Elevated CK     Disposition Plan: Status is: Inpatient  Remains inpatient appropriate because: Will need ongoing hydration, nephrology evaluation/consult and heparin infusion per cardiology   Discussed with husband at bedside.  Updated daughter over the phone     Time spent: 35 minutes [facility] 11/01/2021, 1:59 PM

## 2021-11-01 NOTE — Hospital Course (Addendum)
75 y.o. female with medical history significant of stroke with aphasia, hypertension, hyperlipidemia, depression with anxiety, dementia, CKD-IV, sCHF, brain aneurysm, former smoker, who presents with worsening facial and difficulty walking.   11/19: Negative stroke work-up and her speech is back to baseline per family.  Her right side weakness is resolved. Cardiology recommends 48 hours of heparin infusion with uncertainty of possible MI versus Takotsubo cardiomyopathy.  Worsening CK.  Nephrology consultation 11/20: CK still trending up.  Nephrology seen, cardiology not planning for cath considering renal dysfunction 11/21: 1 PRBC ordered. 11/22, B12 severely low.  Initiating injection B12.  H&H remained stable.  FOBT positive.

## 2021-11-01 NOTE — Assessment & Plan Note (Signed)
Continue Celexa

## 2021-11-01 NOTE — Assessment & Plan Note (Signed)
Continue metoprolol, as needed hydralazine

## 2021-11-01 NOTE — Assessment & Plan Note (Signed)
Continue Rocephin, Flagyl and Zithromax

## 2021-11-01 NOTE — Assessment & Plan Note (Signed)
Baseline creatinine around 1.6-1.9.  Admission creatinine of 2.94 with BUN of 52.  Continue gentle hydration.  Will consult nephrology

## 2021-11-01 NOTE — Progress Notes (Signed)
ANTICOAGULATION CONSULT NOTE  Pharmacy Consult for heparin infusion Indication: chest pain/ACS  No Known Allergies  Patient Measurements: Height: 5\' 6"  (167.6 cm) Weight: 72.7 kg (160 lb 4.4 oz) IBW/kg (Calculated) : 59.3 Heparin Dosing Weight: 72.7 kg  Vital Signs: Temp: 98.1 F (36.7 C) (11/19 0807) BP: 118/69 (11/19 0807) Pulse Rate: 82 (11/19 0807)  Labs: Recent Labs    10/31/21 0648 10/31/21 1522 10/31/21 2300 10/31/21 2355 10/31/21 2356 11/01/21 0606 11/01/21 1113  HGB 10.5*  --   --   --   --  9.8*  --   HCT 33.0*  --   --   --   --  29.9*  --   PLT 291  --   --   --   --  255  --   APTT 28  --   --   --   --   --   --   LABPROT 14.0  --   --   --   --   --   --   INR 1.1  --   --   --   --   --   --   HEPARINUNFRC  --   --   --   --  0.95*  --  0.65  CREATININE 2.94*  --   --   --   --   --   --   CKTOTAL 2,335*  --   --   --   --  3,287*  --   TROPONINIHS  --    < > 4,565* 4,267*  --  3,743*  --    < > = values in this interval not displayed.     Estimated Creatinine Clearance: 16.6 mL/min (A) (by C-G formula based on SCr of 2.94 mg/dL (H)).   Medical History: Past Medical History:  Diagnosis Date   Anxiety    Benign neoplasm of colon 03/27/2014   Brain aneurysm 2006   Cerebrovascular disease 06/28/2014   Chronic kidney disease    Dementia, vascular ([place]) 06/19/2014   Depression    Hyperlipidemia 02/20/2019   Pure hypercholesterolemia 03/27/2014   Receptive aphasia    Sleep apnea    [facility]    Vascular dementia ([place])    Wears dentures     Medications:  Per chart review, no anticoagulation PTA  Assessment: 75 y.o. female with history of hypertension, hyperlipidemia, chronic kidney disease, CVA, left MCA aneurysm status post coiling, and dementia. Pt was on SQ heparin and last dose was 11/18 1325. Pharmacy has been consulted for heparin. HS Trop [id number].   Baseline labs: aPTT, PT-INR and Plt WNL; Hgb 10.5, Hct 33  Goal of Therapy:  Heparin  level 0.3-0.7 units/ml Monitor platelets by anticoagulation protocol: Yes  11/18 2356 HL 0.95, supratherapeutic 11/19 1113 HL 0.65, therapeutic   Plan:  Heparin level therapeutic Continue heparin infusion at 700 units/hr Recheck HL at 1900 to confirm Continue to monitor H&H and platelets  Tawnya Crook, PharmD, BCPS Clinical Pharmacist 11/01/2021 11:41 AM

## 2021-11-01 NOTE — Assessment & Plan Note (Addendum)
Symptoms resolved.  Continue aspirin and statin.  Added Plavix.  Stroke work-up essentially negative including MRI of the brain, carotid Dopplers and echo

## 2021-11-01 NOTE — Progress Notes (Signed)
ANTICOAGULATION CONSULT NOTE  Pharmacy Consult for heparin infusion Indication: chest pain/ACS  No Known Allergies  Patient Measurements: Height: 5\' 6"  (167.6 cm) Weight: 72.7 kg (160 lb 4.4 oz) IBW/kg (Calculated) : 59.3 Heparin Dosing Weight: 72.7 kg  Vital Signs: Temp: 97.9 F (36.6 C) (11/19 1520) Temp Source: Oral (11/19 1520) BP: 127/62 (11/19 1520) Pulse Rate: 62 (11/19 1520)  Labs: Recent Labs    10/31/21 0648 10/31/21 1522 10/31/21 2300 10/31/21 2355 10/31/21 2356 11/01/21 0606 11/01/21 1113 11/01/21 1948  HGB 10.5*  --   --   --   --  9.8*  --   --   HCT 33.0*  --   --   --   --  29.9*  --   --   PLT 291  --   --   --   --  255  --   --   APTT 28  --   --   --   --   --   --   --   LABPROT 14.0  --   --   --   --   --   --   --   INR 1.1  --   --   --   --   --   --   --   HEPARINUNFRC  --   --   --   --  0.95*  --  0.65 0.43  CREATININE 2.94*  --   --   --   --   --   --   --   CKTOTAL 2,335*  --   --   --   --  3,287*  --   --   TROPONINIHS  --    < > 4,565* 4,267*  --  3,743*  --   --    < > = values in this interval not displayed.     Estimated Creatinine Clearance: 16.6 mL/min (A) (by C-G formula based on SCr of 2.94 mg/dL (H)).   Medical History: Past Medical History:  Diagnosis Date   Anxiety    Benign neoplasm of colon 03/27/2014   Brain aneurysm 2006   Cerebrovascular disease 06/28/2014   Chronic kidney disease    Dementia, vascular ([place]) 06/19/2014   Depression    Hyperlipidemia 02/20/2019   Pure hypercholesterolemia 03/27/2014   Receptive aphasia    Sleep apnea    Stroke [facility])    Vascular dementia ([place])    Wears dentures     Medications:  Per chart review, no anticoagulation PTA  Assessment: 75 y.o. female with history of hypertension, hyperlipidemia, chronic kidney disease, CVA, left MCA aneurysm status post coiling, and dementia. Pt was on SQ heparin and last dose was 11/18 1325. Pharmacy has been consulted for heparin. HS Trop  [id number].   Baseline labs: aPTT, PT-INR and Plt WNL; Hgb 10.5, Hct 33  Goal of Therapy:  Heparin level 0.3-0.7 units/ml Monitor platelets by anticoagulation protocol: Yes  11/18 2356 HL 0.95, supratherapeutic 11/19 1113 HL 0.65, therapeutic 11/19 1948 HL 0.43, therapeutic x 2   Plan:  Heparin level 0.43,  therapeutic Continue heparin infusion at 700 units/hr Monitor daily heparin level, CBC, s/s of bleed   Darnelle Bos, PharmD Clinical Pharmacist 11/01/2021 8:25 PM

## 2021-11-01 NOTE — Assessment & Plan Note (Signed)
Due to atypical pneumonia and or possible aspiration.  Normal lactic acid.  Gentle hydration

## 2021-11-01 NOTE — Progress Notes (Signed)
ANTICOAGULATION CONSULT NOTE  Pharmacy Consult for heparin infusion Indication: chest pain/ACS  No Known Allergies  Patient Measurements: Height: 5\' 6"  (167.6 cm) Weight: 72.7 kg (160 lb 4.4 oz) IBW/kg (Calculated) : 59.3 Heparin Dosing Weight: 72.7 kg  Vital Signs: Temp: 97.9 F (36.6 C) (11/18 1824) Temp Source: Oral (11/18 1824) BP: 114/95 (11/18 2356) Pulse Rate: 84 (11/18 2356)  Labs: Recent Labs    10/31/21 0648 10/31/21 1522 10/31/21 2147 10/31/21 2300 10/31/21 2355 10/31/21 2356  HGB 10.5*  --   --   --   --   --   HCT 33.0*  --   --   --   --   --   PLT 291  --   --   --   --   --   APTT 28  --   --   --   --   --   LABPROT 14.0  --   --   --   --   --   INR 1.1  --   --   --   --   --   HEPARINUNFRC  --   --   --   --   --  0.95*  CREATININE 2.94*  --   --   --   --   --   CKTOTAL 2,335*  --   --   --   --   --   TROPONINIHS  --    < > 5,462* 4,565* 4,267*  --    < > = values in this interval not displayed.     Estimated Creatinine Clearance: 16.6 mL/min (A) (by C-G formula based on SCr of 2.94 mg/dL (H)).   Medical History: Past Medical History:  Diagnosis Date   Anxiety    Benign neoplasm of colon 03/27/2014   Brain aneurysm 2006   Cerebrovascular disease 06/28/2014   Chronic kidney disease    Dementia, vascular ([place]) 06/19/2014   Depression    Hyperlipidemia 02/20/2019   Pure hypercholesterolemia 03/27/2014   Receptive aphasia    Sleep apnea    Stroke [facility])    Vascular dementia ([place])    Wears dentures     Medications:  Per chart review, no anticoagulation PTA  Assessment: 75 y.o. female with history of hypertension, hyperlipidemia, chronic kidney disease, CVA, left MCA aneurysm status post coiling, and dementia. Pt was on SQ heparin and last dose was 11/18 1325. Pharmacy has been consulted for heparin. HS Trop [id number].   Baseline labs: aPTT, PT-INR and Plt WNL; Hgb 10.5, Hct 33  Goal of Therapy:  Heparin level 0.3-0.7 units/ml Monitor  platelets by anticoagulation protocol: Yes  11/19 2356 HL 0.95, supratherapeutic   Plan:  Decrease heparin infusion to 700 units/hr Recheck HL in 8 hrs after rate change Continue to monitor H&H and platelets  Renda Rolls, PharmD, [facility] 11/01/2021 3:10 AM

## 2021-11-01 NOTE — Assessment & Plan Note (Addendum)
Lipitor on hold due to elevated CK/rhabdo

## 2021-11-01 NOTE — Assessment & Plan Note (Addendum)
Elevated troponin.  Echo showing akinetic anteroseptal wall and apex.  Troponin peaked at 6500 and now trending down.  Patient is not having any chest pain.  Cardiology recommends conservative management with heparin infusion 48 hours.  Continue low-dose metoprolol.  Due to her renal dysfunction ischemic evaluation will be deferred while here in the hospital per cardiology.  Cannot give statin due to elevated CK/rhabdo

## 2021-11-01 NOTE — Evaluation (Signed)
Physical Therapy Evaluation Patient Details Name: Linda [name] MRN: [id number] DOB: 05-11-1946 Today's Date: 11/01/2021  History of Present Illness  Pt is a 75 y/o F admitted on 10/31/21 after presenting from home with c/o worsening aphasia, weakness, cough & SOB. Head imaging showed no acute infarct. Pt was also found to have elevated troponin & cardiology was consulted.   PMH: stroke with aphasia, HTN, HLD, depression with anxiety, dementia, CKD IV, sCHF, brain aneurysm  Clinical Impression  Cardiology cleared pt for PT via secure chat. Pt received in bed, limited by expressive aphasia with son & daughter arriving towards end of session & providing home set up & PLOF information. Pt is able to complete bed mobility with supervision, extra time & use of hospital bed features but requires min assist for multiple sit<>stand with poor awareness of safe hand placement despite education/cuing. Pt is limited in her ability to weight shift L<>R & take steps 2/2 R hip/LE pain - nurse notified. Pt completes stand pivot to recliner with RW & min assist. Pt's family is eager to take her home with assistance & report they can provide 24 hr assistance. Will continue to follow pt acutely to progress transfers, gait & stairs as able.       Recommendations for follow up therapy are one component of a multi-disciplinary discharge planning process, led by the attending physician.  Recommendations may be updated based on patient status, additional functional criteria and insurance authorization.  Follow Up Recommendations Home health PT    Assistance Recommended at Discharge Frequent or constant Supervision/Assistance  Functional Status Assessment Patient has had a recent decline in their functional status and demonstrates the ability to make significant improvements in function in a reasonable and predictable amount of time.  Equipment Recommendations   (none - pt already has RW, [facility] & family will get w/c)     Recommendations for Other Services       Precautions / Restrictions Precautions Precautions: Fall Precaution Comments: expressive difficulties Restrictions Weight Bearing Restrictions: No      Mobility  Bed Mobility Overal bed mobility: Needs Assistance Bed Mobility: Supine to Sit     Supine to sit: Supervision;HOB elevated (extra time, use of bed rails)          Transfers Overall transfer level: Needs assistance Equipment used: Rolling walker (2 wheels) Transfers: Sit to/from Stand;Bed to chair/wheelchair/BSC Sit to Stand: Min assist Stand pivot transfers: Min assist (RW)         General transfer comment: Pt able to pivot to recliner on R with RW & min assist    Ambulation/Gait                  Stairs            Wheelchair Mobility    Modified Rankin (Stroke Patients Only)       Balance Overall balance assessment: Needs assistance Sitting-balance support: Feet supported;Bilateral upper extremity supported;No upper extremity supported Sitting balance-Leahy Scale: Fair Sitting balance - Comments: supervision static sitting   Standing balance support: Bilateral upper extremity supported;During functional activity Standing balance-Leahy Scale: Fair Standing balance comment: BUE support on RW                             Pertinent Vitals/Pain Pain Assessment: Faces Faces Pain Scale: Hurts even more Pain Location: RLE/hip/groin Pain Descriptors / Indicators:  (pt frequently stating "It hurts" - nurse made aware) Pain Intervention(s): Monitored  during session;Limited activity within patient's tolerance    Home Living Family/patient expects to be discharged to:: Private residence Living Arrangements: Spouse/significant other Available Help at Discharge: Family;Available 24 hours/day Type of Home: House Home Access: Stairs to enter Entrance Stairs-Rails: Left Entrance Stairs-Number of Steps: 2   Home Layout: One level Home  Equipment: Conservation officer, nature (2 wheels);Cane - single point;BSC/3in1      Prior Function               Mobility Comments: son reports pt was ambulatory with RW but endorses at least 1 fall every 2-3 months       Hand Dominance        Extremity/Trunk Assessment   Upper Extremity Assessment Upper Extremity Assessment: Generalized weakness    Lower Extremity Assessment Lower Extremity Assessment: Generalized weakness       Communication   Communication: Expressive difficulties  Cognition Arousal/Alertness: Awake/alert Behavior During Therapy: WFL for tasks assessed/performed Overall Cognitive Status: Difficult to assess                                 General Comments: Pt with expressive difficulties, able to follow simple commands throughout session.        General Comments General comments (skin integrity, edema, etc.): HR 65-72 bpm during session, SpO2 >90% on room air    Exercises General Exercises - Lower Extremity Long Arc Quad: AROM;Strengthening;Both;10 reps;Seated   Assessment/Plan    PT Assessment Patient needs continued PT services  PT Problem List Decreased strength;Decreased mobility;Decreased safety awareness;Decreased activity tolerance;Decreased balance;Decreased cognition;Cardiopulmonary status limiting activity;Pain       PT Treatment Interventions Therapeutic activities;DME instruction;Modalities;Gait training;Therapeutic exercise;Patient/family education;Stair training;Balance training;Functional mobility training;Neuromuscular re-education;Manual techniques;Cognitive remediation    PT Goals (Current goals can be found in the Care Plan section)  Acute Rehab PT Goals Patient Stated Goal: to take pt home PT Goal Formulation: With family Time For Goal Achievement: 11/15/21 Potential to Achieve Goals: Fair    Frequency Min 2X/week   Barriers to discharge Inaccessible home environment steps to enter home    Co-evaluation                AM-PAC PT "6 Clicks" Mobility  Outcome Measure Help needed turning from your back to your side while in a flat bed without using bedrails?: None Help needed moving from lying on your back to sitting on the side of a flat bed without using bedrails?: A Little Help needed moving to and from a bed to a chair (including a wheelchair)?: A Little Help needed standing up from a chair using your arms (e.g., wheelchair or bedside chair)?: A Little Help needed to walk in hospital room?: A Lot Help needed climbing 3-5 steps with a railing? : Total 6 Click Score: 16    End of Session Equipment Utilized During Treatment: Gait belt Activity Tolerance: Patient limited by pain Patient left: in chair;with chair alarm set;with call bell/phone within reach;with family/visitor present;with nursing/sitter in room Nurse Communication: Mobility status (c/o pain) PT Visit Diagnosis: Unsteadiness on feet (R26.81);Pain;Muscle weakness (generalized) (M62.81);Difficulty in walking, not elsewhere classified (R26.2);History of falling (Z91.81) Pain - Right/Left: Right Pain - part of body: Leg;Hip    Time: 4034-7425 PT Time Calculation (min) (ACUTE ONLY): 24 min   Charges:   PT Evaluation $PT Eval Moderate Complexity: 1 Mod PT Treatments $Therapeutic Activity: 8-22 mins        Lavone Nian, PT, DPT 11/01/21,  2:31 PM   [name] 11/01/2021, 2:30 PM

## 2021-11-01 NOTE — Assessment & Plan Note (Addendum)
On Rocephin.  Pending urine culture

## 2021-11-01 NOTE — Consult Note (Signed)
[facility] Cardiology  CARDIOLOGY CONSULT NOTE  Patient ID: Linda [name] MRN: [id number] DOB/AGE: 76-Jul-1947 76 y.o.  Admit date: 10/31/2021 Referring Physician Ivor Costa Primary Physician Feldpausch, Chrissie Noa, MD Primary Cardiologist None Reason for Consultation NSTEMI  HPI:  Linda [name] is a 75 year old female with history of CVA with residual aphasia, hypertension, hyperlipidemia, dementia, CKD 4, chronic diastolic heart failure who was admitted with worse facial droop and difficulty walking.  Cardiology is consulted because the troponin is elevated to 7000 and echocardiogram shows a hypokinetic anterior wall and apex.  Troponin subsequently decreased.  The patient has no chest pain.  Interval history:  - She feels back to normal today. Begging to go home.  - Denies any episodes of chest pain or shortness of breath.   Review of systems complete and found to be negative unless listed above     Past Medical History:  Diagnosis Date   Anxiety    Benign neoplasm of colon 03/27/2014   Brain aneurysm 2006   Cerebrovascular disease 06/28/2014   Chronic kidney disease    Dementia, vascular ([place]) 06/19/2014   Depression    Hyperlipidemia 02/20/2019   Pure hypercholesterolemia 03/27/2014   Receptive aphasia    Sleep apnea    Stroke [facility])    Vascular dementia ([place])    Wears dentures     Past Surgical History:  Procedure Laterality Date   CATARACT EXTRACTION W/PHACO Left 04/27/2018   Procedure: CATARACT EXTRACTION PHACO AND INTRAOCULAR LENS PLACEMENT ([place]) LEFT;  Surgeon: Leandrew Koyanagi, MD;  Location: [place];  Service: Ophthalmology;  Laterality: Left;   CATARACT EXTRACTION W/PHACO Right 06/14/2018   Procedure: CATARACT EXTRACTION PHACO AND INTRAOCULAR LENS PLACEMENT ([place])  RIGHT;  Surgeon: Leandrew Koyanagi, MD;  Location: [place];  Service: Ophthalmology;  Laterality: Right;   CEREBRAL ANEURYSM REPAIR      Medications Prior to Admission  Medication Sig  Dispense Refill Last Dose   amLODipine (NORVASC) 2.5 MG tablet Take 2.5 mg by mouth daily.   10/30/2021 at 0800   atorvastatin (LIPITOR) 40 MG tablet Take 1 tablet (40 mg total) by mouth daily. 30 tablet 2 10/30/2021 at 0800   citalopram (CELEXA) 20 MG tablet Take 20 mg by mouth daily.   10/30/2021 at 0800   clopidogrel (PLAVIX) 75 MG tablet Take 1 tablet (75 mg total) by mouth daily. 30 tablet 2 10/30/2021 at 0800   losartan (COZAAR) 100 MG tablet Take 100 mg by mouth daily.   10/30/2021 at 0800   MYRBETRIQ 50 MG TB24 tablet Take 1 tablet by mouth once daily 30 tablet 11 10/30/2021 at 0800   Vitamin D, Ergocalciferol, (DRISDOL) 50000 units CAPS capsule Take 50,000 Units by mouth once a week.    Past Week at Unknown    Social History   Socioeconomic History   Marital status: Married    Spouse name: Not on file   Number of children: Not on file   Years of education: Not on file   Highest education level: Not on file  Occupational History   Not on file  Tobacco Use   Smoking status: Former    Packs/day: 1.00    Types: Cigarettes   Smokeless tobacco: Never  Vaping Use   Vaping Use: Never used  Substance and Sexual Activity   Alcohol use: Not Currently   Drug use: Never   Sexual activity: Not on file  Other Topics Concern   Not on file  Social History Narrative   Not on  file   Social Determinants of Health   Financial Resource Strain: Not on file  Food Insecurity: Not on file  Transportation Needs: Not on file  Physical Activity: Not on file  Stress: Not on file  Social Connections: Not on file  Intimate Partner Violence: Not on file    Family History  Problem Relation Age of Onset   Dementia Mother    Heart attack Brother       Review of systems complete and found to be negative unless listed above      PHYSICAL EXAM  General: Well developed, well nourished, in no acute distress HEENT:  Normocephalic and atramatic Neck:  No JVD.  Lungs: Clear bilaterally to  auscultation and percussion. Heart: HRRR . Normal S1 and S2 without gallops or murmurs.  Abdomen: Bowel sounds are positive, abdomen soft and non-tender  Msk:  Back normal, normal gait. Normal strength and tone for age. Extremities: No clubbing, cyanosis or edema.   Neuro: Alert and oriented X 3. Psych:  Good affect, responds appropriately  Labs:   Lab Results  Component Value Date   WBC 14.0 (H) 11/01/2021   HGB 9.8 (L) 11/01/2021   HCT 29.9 (L) 11/01/2021   MCV 96.8 11/01/2021   PLT 255 11/01/2021    Recent Labs  Lab 10/31/21 0648  NA 136  K 5.1  CL 106  CO2 20*  BUN 52*  CREATININE 2.94*  CALCIUM 9.2  PROT 8.2*  BILITOT 0.5  ALKPHOS 48  ALT 23  AST 59*  GLUCOSE 118*    Lab Results  Component Value Date   CKTOTAL 3,287 (H) 11/01/2021   TROPONINI < 0.02 03/17/2012     Lab Results  Component Value Date   CHOL 120 11/01/2021   CHOL 138 06/28/2020   CHOL 136 06/15/2020   Lab Results  Component Value Date   HDL 43 11/01/2021   HDL 53 06/28/2020   HDL 40 (L) 06/15/2020   Lab Results  Component Value Date   LDLCALC 63 11/01/2021   LDLCALC 73 06/28/2020   LDLCALC 76 06/15/2020   Lab Results  Component Value Date   TRIG 71 11/01/2021   TRIG 58 06/28/2020   TRIG 99 06/15/2020   Lab Results  Component Value Date   CHOLHDL 2.8 11/01/2021   CHOLHDL 2.6 06/28/2020   CHOLHDL 3.4 06/15/2020   No results found for: LDLDIRECT    Radiology: MR ANGIO HEAD WO CONTRAST  Result Date: 10/31/2021 CLINICAL DATA:  Neuro deficit, acute, stroke suspected EXAM: MRI HEAD WITHOUT CONTRAST MRA HEAD WITHOUT CONTRAST TECHNIQUE: Multiplanar, multi-echo pulse sequences of the brain and surrounding structures were acquired without intravenous contrast. Angiographic images of the Circle of Willis were acquired using MRA technique without intravenous contrast. COMPARISON:  10/15/2021 FINDINGS: MRI HEAD FINDINGS Brain: No acute infarction or hemorrhage. Encephalomalacia and  gliosis in the left cerebral hemisphere with involvement of frontal, parietal, temporal, and occipital lobes. Associated chronic blood products particularly in the temporal lobe. Ex vacuo dilatation of the left lateral ventricle. Chronic small vessel infarcts of the central gray nuclei, central white matter, pons, and cerebellum. Few additional scattered foci of susceptibility likely reflecting chronic microhemorrhages. Other stable confluent areas of T2 hyperintensity in the supratentorial and pontine white matter are nonspecific but probably reflect advanced chronic microvascular ischemic changes. Prominence of the ventricles and sulci reflects parenchymal volume loss. Vascular: Susceptibility artifact related to left MCA aneurysm coiling. Skull and upper cervical spine: Marrow signal is within normal limits. Sinuses/Orbits:  Chronic left sphenoid sinusitis. Bilateral lens replacements. Other: Suprasellar mass along pituitary infundibulum is not well evaluated on this study. Persistent bilateral mastoid effusions. MRA HEAD FINDINGS Anterior circulation: Intracranial internal carotid arteries are patent with atherosclerotic irregularity. Anterior cerebral arteries are patent. Middle cerebral arteries are patent. There is susceptibility artifact at site of left MCA bifurcation aneurysm coiling. No evidence of recurrence. Posterior circulation: Intracranial vertebral arteries are patent. The right vertebral artery terminates as a PICA. Basilar artery is patent. Posterior cerebral arteries are patent. Bilateral posterior communicating arteries are present. Fetal origin of the left PCA. IMPRESSION: No acute infarction or other acute abnormality. Stable chronic findings compared to recent prior study. No significant vascular abnormality. 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 Electronically Signed   By: Macy Mis M.D.   On: 10/31/2021 12:42   MR BRAIN WO CONTRAST  Result  Date: 10/31/2021 CLINICAL DATA:  Neuro deficit, acute, stroke suspected EXAM: MRI HEAD WITHOUT CONTRAST MRA HEAD WITHOUT CONTRAST TECHNIQUE: Multiplanar, multi-echo pulse sequences of the brain and surrounding structures were acquired without intravenous contrast. Angiographic images of the Circle of Willis were acquired using MRA technique without intravenous contrast. COMPARISON:  10/15/2021 FINDINGS: MRI HEAD FINDINGS Brain: No acute infarction or hemorrhage. Encephalomalacia and gliosis in the left cerebral hemisphere with involvement of frontal, parietal, temporal, and occipital lobes. Associated chronic blood products particularly in the temporal lobe. Ex vacuo dilatation of the left lateral ventricle. Chronic small vessel infarcts of the central gray nuclei, central white matter, pons, and cerebellum. Few additional scattered foci of susceptibility likely reflecting chronic microhemorrhages. Other stable confluent areas of T2 hyperintensity in the supratentorial and pontine white matter are nonspecific but probably reflect advanced chronic microvascular ischemic changes. Prominence of the ventricles and sulci reflects parenchymal volume loss. Vascular: Susceptibility artifact related to left MCA aneurysm coiling. Skull and upper cervical spine: Marrow signal is within normal limits. Sinuses/Orbits: Chronic left sphenoid sinusitis. Bilateral lens replacements. Other: Suprasellar mass along pituitary infundibulum is not well evaluated on this study. Persistent bilateral mastoid effusions. MRA HEAD FINDINGS Anterior circulation: Intracranial internal carotid arteries are patent with atherosclerotic irregularity. Anterior cerebral arteries are patent. Middle cerebral arteries are patent. There is susceptibility artifact at site of left MCA bifurcation aneurysm coiling. No evidence of recurrence. Posterior circulation: Intracranial vertebral arteries are patent. The right vertebral artery terminates as a PICA.  Basilar artery is patent. Posterior cerebral arteries are patent. Bilateral posterior communicating arteries are present. Fetal origin of the left PCA. IMPRESSION: No acute infarction or other acute abnormality. Stable chronic findings compared to recent prior study. No significant vascular abnormality. 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 Electronically Signed   By: Macy Mis M.D.   On: 10/31/2021 12:42   MR BRAIN W WO CONTRAST  Result Date: 10/15/2021 CLINICAL DATA:  Follow-up suprasellar mass. EXAM: MRI HEAD WITHOUT AND WITH CONTRAST TECHNIQUE: Multiplanar, multiecho pulse sequences of the brain and surrounding structures were obtained without and with intravenous contrast. 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 This is in the midline and is in the area of the infundibulum. This is just above the pituitary but appears separate from the pituitary which otherwise appears normal. Generalized atrophy with ventricular enlargement which is similar to the prior study. There is encephalomalacia and chronic infarction in the left frontal and  temporal lobe. Chronic blood products are present in the left temporal infarct. Prior aneurysm coiling in the left MCA. Advanced chronic microvascular ischemic changes in the white matter. Negative for acute infarct. Vascular: Normal arterial flow voids. Aneurysm coiling left MCA bifurcation region. Skull and upper cervical spine: No focal lesion. Sinuses/Orbits: Mucosal edema in the paranasal sinuses. Bilateral mastoid effusion. 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 This is just above the pituitary and appears separate from the pituitary. Extensive atrophy. Extensive chronic microvascular  ischemic change. Chronic left MCA infarct. Left MCA bifurcation aneurysm coiling. Electronically Signed   By: Franchot Gallo M.D.   On: 10/15/2021 16:05   US Carotid Bilateral (at [facility] and AP only)  Result Date: 10/31/2021 CLINICAL DATA:  Stroke, hyperlipidemia, former smoker EXAM: BILATERAL CAROTID DUPLEX ULTRASOUND TECHNIQUE: Pearline Cables scale imaging, color Doppler and duplex ultrasound were performed of bilateral carotid and vertebral arteries in the neck. COMPARISON:  None. FINDINGS: Criteria: Quantification of carotid stenosis is based on velocity parameters that correlate the residual internal carotid diameter with NASCET-based stenosis levels, using the diameter of the distal internal carotid lumen as the denominator for stenosis measurement. The following velocity measurements were obtained: RIGHT ICA: 71/27 cm/sec CCA: 02/72 cm/sec SYSTOLIC ICA/CCA RATIO:  1.0 ECA: 47 cm/sec LEFT ICA: 127/36 cm/sec CCA: 53/66 cm/sec SYSTOLIC ICA/CCA RATIO:  2.8 ECA: 46 cm/sec RIGHT CAROTID ARTERY: Moderate mixed echogenicity carotid bifurcation atherosclerosis. Despite this, no significant ICA stenosis, velocity elevation, turbulent flow. Degree of narrowing less than 50% by ultrasound criteria. RIGHT VERTEBRAL ARTERY:  Normal antegrade flow LEFT CAROTID ARTERY: Similar moderate mixed echogenicity irregular bifurcation atherosclerosis. Negative for significant stenosis, velocity elevation, turbulent flow. Degree of narrowing also less than 50% by ultrasound criteria. LEFT VERTEBRAL ARTERY:  Normal antegrade flow IMPRESSION: Bilateral carotid atherosclerosis. Negative for significant ICA stenosis. Degree of narrowing less than 50% bilaterally by ultrasound criteria. Patent antegrade vertebral flow bilaterally Electronically Signed   By: Jerilynn Mages.  Shick M.D.   On: 10/31/2021 14:25   DG Chest Portable 1 View  Result Date: 10/31/2021 CLINICAL DATA:  75 year old female code stroke presentation. Cough for 1 week. EXAM: PORTABLE  CHEST 1 VIEW COMPARISON:  Chest radiographs 03/17/2012. FINDINGS: Portable AP upright view at 0652 hours. Chronic large lung volumes. Mild cardiomegaly appears new since 2013. Other mediastinal contours are within normal limits. Visualized tracheal air column is within normal limits. Chronic but increased and coarse bilateral pulmonary interstitial opacity, fairly symmetric and with some basilar predominance. No pneumothorax, pleural effusion or confluent opacity. No acute osseous abnormality identified. Negative visible bowel gas. IMPRESSION: Chronic hyperinflation with acute on chronic bilateral pulmonary interstitial opacity. Top differential considerations include viral/atypical respiratory infection, pulmonary edema, progressed chronic lung disease. Mild cardiomegaly.  No pleural effusion. Electronically Signed   By: Genevie Ann M.D.   On: 10/31/2021 07:14   ECHOCARDIOGRAM COMPLETE  Result Date: 10/31/2021    ECHOCARDIOGRAM REPORT   Patient Name:   Linda [name] Date of Exam: 10/31/2021 Medical Rec #:  [id number]      Height:       66.0 in Accession #:    [id number]     Weight:       160.3 lb Date of Birth:  01-25-1946      BSA:          1.820 m Patient Age:    41 years       BP:  145/87 mmHg Patient Gender: F              HR:           85 bpm. Exam Location:  [facility] Procedure: 2D Echo, Cardiac Doppler and Color Doppler Indications:     Stroke I63.9  History:         Patient has no prior history of Echocardiogram examinations.                  Stroke; Risk Factors:Dyslipidemia. CKD.  Sonographer:     Sherrie Sport Referring Phys:  Unknown Foley NIU Diagnosing Phys: Donnelly Angelica  Sonographer Comments: Suboptimal apical window. IMPRESSIONS  1. Left ventricular ejection fraction, by estimation, is 30 to 35%. The left ventricle has moderately decreased function. The left ventricle demonstrates regional wall motion abnormalities (see scoring diagram/findings for description). Left ventricular  diastolic parameters  are consistent with Grade I diastolic dysfunction (impaired relaxation).  2. Right ventricular systolic function is normal. The right ventricular size is normal.  3. The mitral valve is normal in structure. Mild mitral valve regurgitation. No evidence of mitral stenosis.  4. The aortic valve was not well visualized. Aortic valve regurgitation is not visualized. No aortic stenosis is present. 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 FINDINGS  Left Ventricle: Left ventricular ejection fraction, by estimation, is 30 to 35%. The left ventricle has moderately decreased function. The left ventricle demonstrates regional wall motion abnormalities. The left ventricular internal cavity size was normal in size. There is no left ventricular hypertrophy. Left ventricular diastolic parameters are consistent with Grade I diastolic dysfunction (impaired relaxation).  LV Wall Scoring: The entire septum is akinetic. The basal anterior segment is hypokinetic. Right Ventricle: The right ventricular size is normal. No increase in right ventricular wall thickness. Right ventricular systolic function is normal. Left Atrium: Left atrial size was normal in size. Right Atrium: Right atrial size was normal in size. Pericardium: There is no evidence of pericardial effusion. Mitral Valve: The mitral valve is normal in structure. Mild mitral valve regurgitation. No evidence of mitral valve stenosis. MV peak gradient, 5.9 mmHg. The mean mitral valve gradient is 2.0 mmHg. Tricuspid Valve: The tricuspid valve is normal in structure. Tricuspid valve regurgitation is mild. Aortic Valve: The aortic valve was not well visualized. Aortic valve regurgitation is not visualized. No aortic stenosis is present. Aortic valve mean gradient measures 1.5 mmHg. Aortic valve peak gradient measures 3.3 mmHg. Aortic valve area, by VTI measures 4.42 cm. Pulmonic Valve: The pulmonic valve was not well  visualized. Pulmonic valve regurgitation is not visualized. Aorta: The aortic root was not well visualized. IAS/Shunts: The interatrial septum was not assessed.  LEFT VENTRICLE PLAX 2D LVIDd:         4.00 cm     Diastology LVIDs:         3.40 cm     LV e' medial:    3.92 cm/s LV PW:         1.20 cm     LV E/e' medial:  12.6 LV IVS:        0.80 cm     LV e' lateral:   5.00 cm/s LVOT diam:     2.10 cm     LV E/e' lateral: 9.9 LV SV:         63 LV SV Index:   35 LVOT Area:     3.46 cm  LV Volumes (MOD) LV vol d, MOD A2C: 84.5  ml LV vol d, MOD A4C: 64.1 ml LV vol s, MOD A2C: 71.1 ml LV vol s, MOD A4C: 34.9 ml LV SV MOD A2C:     13.4 ml LV SV MOD A4C:     64.1 ml LV SV MOD BP:      24.6 ml RIGHT VENTRICLE RV Basal diam:  3.50 cm RV S prime:     10.30 cm/s TAPSE (M-mode): 3.4 cm LEFT ATRIUM             Index        RIGHT ATRIUM           Index LA diam:        3.00 cm 1.65 cm/m   RA Area:     10.40 cm LA Vol (A2C):   48.2 ml 26.48 ml/m  RA Volume:   21.10 ml  11.59 ml/m LA Vol (A4C):   67.4 ml 37.03 ml/m LA Biplane Vol: 62.3 ml 34.23 ml/m  AORTIC VALVE                    PULMONIC VALVE AV Area (Vmax):    3.78 cm     PV Vmax:        0.59 m/s AV Area (Vmean):   3.81 cm     PV Vmean:       38.300 cm/s AV Area (VTI):     4.42 cm     PV VTI:         0.083 m AV Vmax:           90.35 cm/s   PV Peak grad:   1.4 mmHg AV Vmean:          59.950 cm/s  PV Mean grad:   1.0 mmHg AV VTI:            0.144 m      RVOT Peak grad: 2 mmHg AV Peak Grad:      3.3 mmHg AV Mean Grad:      1.5 mmHg LVOT Vmax:         98.60 cm/s LVOT Vmean:        66.000 cm/s LVOT VTI:          0.183 m LVOT/AV VTI ratio: 1.28  AORTA Ao Root diam: 2.70 cm MITRAL VALVE               TRICUSPID VALVE MV Area (PHT): 5.34 cm    TR Peak grad:   43.8 mmHg MV Area VTI:   3.88 cm    TR Vmax:        331.00 cm/s MV Peak grad:  5.9 mmHg MV Mean grad:  2.0 mmHg    SHUNTS MV Vmax:       1.21 m/s    Systemic VTI:  0.18 m MV Vmean:      70.4 cm/s   Systemic Diam: 2.10 cm  MV Decel Time: 142 msec    Pulmonic VTI:  0.116 m MV E velocity: 49.30 cm/s MV A velocity: 84.80 cm/s MV E/A ratio:  0.58 Donnelly Angelica Electronically signed by Donnelly Angelica Signature Date/Time: 10/31/2021/3:14:43 PM    Final    CT HEAD CODE STROKE WO CONTRAST  Result Date: 10/31/2021 CLINICAL DATA:  Code stroke.  75 year old female with aphasia. EXAM: CT HEAD WITHOUT CONTRAST TECHNIQUE: Contiguous axial images were obtained from the base of the skull through the vertex without intravenous contrast. COMPARISON:  Recent brain MRI 10/15/2021.  Head CT 06/27/2020. FINDINGS:  Brain: Previously embolized left MCA bifurcation region aneurysm with associated streak artifact. Fairly extensive chronic encephalomalacia in the left hemisphere involving both the MCA and PCA territories. Superimposed advanced bilateral cerebral white matter hypodensity. Small area of chronic encephalomalacia in the anterior right MCA territory. Advanced small vessel disease throughout the bilateral deep gray nuclei. And chronic small vessel disease also in the brainstem and bilateral cerebellar hemispheres. Ex vacuo ventricular enlargement is stable from earlier this month. No superimposed midline shift, mass effect, intracranial hemorrhage or evidence of cortically based acute infarction. 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 Vascular: Calcified atherosclerosis at the skull base. 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 No suspicious intracranial vascular hyperdensity. Skull: No acute osseous abnormality identified. Sinuses/Orbits: Chronic sinusitis with scattered mucoperiosteal thickening has not significantly changed from last year. Mild right mastoid effusion not significantly changed. Other: No acute orbit or scalp soft tissue finding. ASPECTS [facility] Stroke Program Early CT Score) Total score (0-10 with 10 being normal): 10 (chronic encephalomalacia) IMPRESSION: 1. Severe chronic ischemic disease. No  acute cortically based infarct or acute intracranial hemorrhage identified. 2. Previous left MCA aneurysm coil embolization. Study discussed by telephone with Dr. Pryor Curia on 10/31/2021 at 06:36 . Electronically Signed   By: Genevie Ann M.D.   On: 10/31/2021 06:37    EKG: Sinus rhythm with nonspecific ST and T wave abnormalities; there is 1 mm of ST elevation in lead V4 but isolated.  Echo 10/31/21- 1. Left ventricular ejection fraction, by estimation, is 30 to 35%. The  left ventricle has moderately decreased function. The left ventricle  demonstrates regional wall motion abnormalities (see scoring  diagram/findings for description). Left ventricular   diastolic parameters are consistent with Grade I diastolic dysfunction  (impaired relaxation).   2. Right ventricular systolic function is normal. The right ventricular  size is normal.   3. The mitral valve is normal in structure. Mild mitral valve  regurgitation. No evidence of mitral stenosis.   4. The aortic valve was not well visualized. Aortic valve regurgitation  is not visualized. No aortic stenosis is present.   ASSESSMENT AND PLAN:  Linda [name] is a 75 year old female with history of CVA with residual aphasia, hypertension, hyperlipidemia, dementia, CKD 4, chronic diastolic heart failure who was admitted with worse facial droop and difficulty walking.  Cardiology is consulted because the troponin is elevated to 7000 and echocardiogram shows a hypokinetic anterior wall and apex.  # Elevated troponin d/t missed MI versus Stress cardiomyopathy # Apical/ anterior akinesis The patient is admitted with speech abnormality and weakness.  As part of her stroke evaluation echocardiogram was completed that showed an akinetic anteroseptal wall and apex and a troponin that was elevated to 6500 and decreasing therafter.  Per report she did not have any chest pain prior to this, and she very clearly denies chest pain at this time.  Most likely this  is Takotsubo in the setting of her stroke/TIA, but but it is also reasonable to consider missed MI in the last several days.  Given her renal insufficiency and neurologic findings we will manage conservatively.  We will consider an ischemic evaluation depending on how her renal function improves. -Continue aspirin and Plavix -Agree with heparin infusion x48 hours ending tomorrow. -Continue Lipitor 40 mg daily -Metoprolol XL 12.5 mg daily started -Pending clinical course she may need an ischemic evaluation prior to discharge but renal function is a major barrier. This can likely be completed as an outpatient  depending on clinical condition.  Signed: Andrez Grime MD 11/01/2021, 8:29 AM

## 2021-11-02 ENCOUNTER — Inpatient Hospital Stay: Payer: Medicare Other

## 2021-11-02 DIAGNOSIS — J9601 Acute respiratory failure with hypoxia: Secondary | ICD-10-CM | POA: Diagnosis not present

## 2021-11-02 DIAGNOSIS — J189 Pneumonia, unspecified organism: Secondary | ICD-10-CM | POA: Diagnosis not present

## 2021-11-02 DIAGNOSIS — I5181 Takotsubo syndrome: Secondary | ICD-10-CM | POA: Diagnosis not present

## 2021-11-02 DIAGNOSIS — I5022 Chronic systolic (congestive) heart failure: Secondary | ICD-10-CM | POA: Diagnosis not present

## 2021-11-02 LAB — BASIC METABOLIC PANEL
Anion gap: 9 (ref 5–15)
BUN: 58 mg/dL — ABNORMAL HIGH (ref 8–23)
CO2: 17 mmol/L — ABNORMAL LOW (ref 22–32)
Calcium: 8.2 mg/dL — ABNORMAL LOW (ref 8.9–10.3)
Chloride: 112 mmol/L — ABNORMAL HIGH (ref 98–111)
Creatinine, Ser: 2.74 mg/dL — ABNORMAL HIGH (ref 0.44–1.00)
GFR, Estimated: 17 mL/min — ABNORMAL LOW (ref >60)
Glucose, Bld: 102 mg/dL — ABNORMAL HIGH (ref 70–99)
Potassium: 4.2 mmol/L (ref 3.5–5.1)
Sodium: 138 mmol/L (ref 135–145)

## 2021-11-02 LAB — HEPARIN LEVEL (UNFRACTIONATED): Heparin Unfractionated: 0.2 IU/mL — ABNORMAL LOW (ref 0.30–0.70)

## 2021-11-02 LAB — CBC
HCT: 25.6 % — ABNORMAL LOW (ref 36.0–46.0)
Hemoglobin: 8 g/dL — ABNORMAL LOW (ref 12.0–15.0)
MCH: 31.3 pg (ref 26.0–34.0)
MCHC: 31.3 g/dL (ref 30.0–36.0)
MCV: 100 fL (ref 80.0–100.0)
Platelets: 238 10*3/uL (ref 150–400)
RBC: 2.56 MIL/uL — ABNORMAL LOW (ref 3.87–5.11)
RDW: 13.8 % (ref 11.5–15.5)
WBC: 13.3 10*3/uL — ABNORMAL HIGH (ref 4.0–10.5)
nRBC: 0 % (ref 0.0–0.2)

## 2021-11-02 LAB — CK: Total CK: 3392 U/L — ABNORMAL HIGH (ref 38–234)

## 2021-11-02 LAB — LEGIONELLA PNEUMOPHILA SEROGP 1 UR AG: L. pneumophila Serogp 1 Ur Ag: NEGATIVE

## 2021-11-02 MED ORDER — HEPARIN BOLUS VIA INFUSION
1100.0000 [IU] | INTRAVENOUS | Status: AC
  Administered 2021-11-02: 1100 [IU] via INTRAVENOUS
  Filled 2021-11-02: qty 1100

## 2021-11-02 NOTE — Assessment & Plan Note (Signed)
Elevated troponin.  Echo showing akinetic anteroseptal wall and apex.  Troponin peaked at 6500 and now trending down.  Patient is not having any chest pain.  Cardiology recommends conservative management with heparin infusion 48 hours.  Continue low-dose metoprolol.  Due to her renal dysfunction ischemic evaluation will be deferred while here in the hospital per cardiology.  Cannot give statin due to elevated CK/rhabdo.  Continue IV hydration for now

## 2021-11-02 NOTE — Assessment & Plan Note (Signed)
Due to atypical pneumonia and or possible aspiration.  Normal lactic acid.  Continue IV hydration

## 2021-11-02 NOTE — Assessment & Plan Note (Signed)
Symptoms resolved.  Continue aspirin and statin.  Added Plavix.  Stroke work-up essentially negative including MRI of the brain, carotid Dopplers and echo.

## 2021-11-02 NOTE — Assessment & Plan Note (Signed)
Continue Rocephin, Flagyl and Zithromax.

## 2021-11-02 NOTE — Assessment & Plan Note (Signed)
Continue metoprolol, as needed hydralazine.

## 2021-11-02 NOTE — Plan of Care (Signed)
°  Problem: Education: °Goal: Knowledge of disease or condition will improve °Outcome: Adequate for Discharge °Goal: Knowledge of secondary prevention will improve (SELECT ALL) °Outcome: Adequate for Discharge °Goal: Knowledge of patient specific risk factors will improve (INDIVIDUALIZE FOR PATIENT) °Outcome: Adequate for Discharge °  °

## 2021-11-02 NOTE — Progress Notes (Signed)
Progress Note    Linda [name]   [id number]  DOB: [birth year]  DOA: 10/31/2021     2 Date of Service: 11/02/2021   Clinical Course  75 y.o. female with medical history significant of stroke with aphasia, hypertension, hyperlipidemia, depression with anxiety, dementia, CKD-IV, sCHF, brain aneurysm, former smoker, who presents with worsening facial and difficulty walking.   11/19: Negative stroke work-up and her speech is back to baseline per family.  Her right side weakness is resolved. Cardiology recommends 48 hours of heparin infusion with uncertainty of possible MI versus Takotsubo cardiomyopathy.  Worsening CK.  Nephrology consultation 11/20: CK still trending up.  Nephrology seen, cardiology not planning for cath considering renal dysfunction   Assessment and Plan * Takotsubo cardiomyopathy Elevated troponin.  Echo showing akinetic anteroseptal wall and apex.  Troponin peaked at 6500 and now trending down.  Patient is not having any chest pain.  Cardiology recommends conservative management with heparin infusion 48 hours.  Continue low-dose metoprolol.  Due to her renal dysfunction ischemic evaluation will be deferred while here in the hospital per cardiology.  Cannot give statin due to elevated CK/rhabdo.  Continue IV hydration for now  TIA (transient ischemic attack) Symptoms resolved.  Continue aspirin and statin.  Added Plavix.  Stroke work-up essentially negative including MRI of the brain, carotid Dopplers and echo.  UTI (urinary tract infection) On Rocephin.  Pending urine culture.  CKD (chronic kidney disease), stage IV (HCC) Baseline creatinine around 1.6-1.9.  Admission creatinine of 2.94> 2.74  Continue IV hydration. nephrology consulted and seen  Sepsis [facility]) Due to atypical pneumonia and or possible aspiration.  Normal lactic acid.  Continue IV hydration  Rhabdomyolysis CK of 3287> 3392.  Monitor.  Hold statin.  Continue IV fluids and monitor CK  Atypical  pneumonia Continue Rocephin, Flagyl and Zithromax.  Depression with anxiety Continue Celexa.  Chronic systolic CHF (congestive heart failure) (HCC) Echo shows EF of 30 to 35%.  Continue metoprolol, aspirin.  Heparin infusion for 48 hours.  Unable to restart losartan due to renal dysfunction.  Cannot give Lipitor due to rhabdo.  Stop heparin if cardiology is okay  Essential hypertension Continue metoprolol, as needed hydralazine.  Hyperlipidemia Lipitor on hold due to elevated CK/rhabdo.     Subjective:  Wants to go home and not happy for her needing to stay.  Complains of right hip pain  Objective Vitals:   11/01/21 2111 11/02/21 0145 11/02/21 0414 11/02/21 0732  BP: 128/68 137/67 124/69 (!) 154/72  Pulse: 72 70 69 76  Resp: 16 16 20 18   Temp: 97.7 F (36.5 C) 98.1 F (36.7 C) 98 F (36.7 C) 98.1 F (36.7 C)  TempSrc: Oral Oral Oral   SpO2: 95% 95% 96% 99%  Weight:      Height:       72.7 kg  Vital signs were reviewed and unremarkable.   Exam Physical Exam   General:Not in acute distress HEENT: Eyes: PERRL, EOMI, no scleral icterus. Neck:No JVD, no bruit, no mass felt. Heme:No neck lymph node enlargement. Cardiac:S1/S2, RRR, No murmurs, No gallops or rubs. Respiratory: Rhonchi at the bases.  Decreased breath sounds.  No crackles or rales VE:LFYB, nondistended, nontender, no rebound pain, no organomegaly, BS present. GU: No hematuria Ext:has traceleg edema bilaterally.1+DP/PT pulse bilaterally. Musculoskeletal:Right hip tenderness Skin: No rashes.  Neuro: Alert, cranial nerves II-XII grossly intact.Muscle strength4/5 inright let and 5/5 in all otherextremities. Psych: She looks emotional and somewhat anxious  Labs / Other Information My  review of labs, imaging, notes and other tests is significant for Elevated CK     Disposition Plan: Status is: Inpatient  Remains inpatient appropriate because: CK trending up.  Right hip pain  with negative hip x-ray for fracture.  Cardio and nephro work-up in progress   Discharge in next 1 to 2 days depending on clinical condition.  Patient refused SNF     Time spent: 35 minutes [facility] 11/02/2021, 10:54 AM

## 2021-11-02 NOTE — Plan of Care (Signed)
  Problem: Education: Goal: Knowledge of disease or condition will improve Outcome: Progressing   

## 2021-11-02 NOTE — Plan of Care (Signed)
  Problem: Education: Goal: Knowledge of disease or condition will improve Outcome: Not Progressing Goal: Knowledge of secondary prevention will improve (SELECT ALL) Outcome: Not Progressing Goal: Knowledge of patient specific risk factors will improve (INDIVIDUALIZE FOR PATIENT) Outcome: Not Progressing  Patient needs reinforcement and family education. Unable to determine if learning occurred

## 2021-11-02 NOTE — Assessment & Plan Note (Signed)
Continue Celexa

## 2021-11-02 NOTE — Consult Note (Signed)
[facility] Cardiology  CARDIOLOGY CONSULT NOTE  Patient ID: Linda [name] MRN: [id number] DOB/AGE: 1946-08-22 75 y.o.  Admit date: 10/31/2021 Referring Physician Ivor Costa Primary Physician Feldpausch, Chrissie Noa, MD Primary Cardiologist None Reason for Consultation NSTEMI  HPI:  Linda [name] is a 75 year old female with history of CVA with residual aphasia, hypertension, hyperlipidemia, dementia, CKD 4, chronic diastolic heart failure who was admitted with worse facial droop and difficulty walking.  Cardiology is consulted because the troponin is elevated to 7000 and echocardiogram shows a hypokinetic anterior wall and apex.  Troponin subsequently decreased.  The patient has no chest pain.  Interval history:  - Denies chest pain or shortness of breath. \ - Begging to go home.  - No acute events   Review of systems complete and found to be negative unless listed above     Past Medical History:  Diagnosis Date   Anxiety    Benign neoplasm of colon 03/27/2014   Brain aneurysm 2006   Cerebrovascular disease 06/28/2014   Chronic kidney disease    Dementia, vascular ([place]) 06/19/2014   Depression    Hyperlipidemia 02/20/2019   Pure hypercholesterolemia 03/27/2014   Receptive aphasia    Sleep apnea    Stroke [facility])    Vascular dementia ([place])    Wears dentures     Past Surgical History:  Procedure Laterality Date   CATARACT EXTRACTION W/PHACO Left 04/27/2018   Procedure: CATARACT EXTRACTION PHACO AND INTRAOCULAR LENS PLACEMENT ([place]) LEFT;  Surgeon: Leandrew Koyanagi, MD;  Location: [place];  Service: Ophthalmology;  Laterality: Left;   CATARACT EXTRACTION W/PHACO Right 06/14/2018   Procedure: CATARACT EXTRACTION PHACO AND INTRAOCULAR LENS PLACEMENT ([place])  RIGHT;  Surgeon: Leandrew Koyanagi, MD;  Location: [place];  Service: Ophthalmology;  Laterality: Right;   CEREBRAL ANEURYSM REPAIR      Medications Prior to Admission  Medication Sig Dispense Refill Last Dose    amLODipine (NORVASC) 2.5 MG tablet Take 2.5 mg by mouth daily.   10/30/2021 at 0800   atorvastatin (LIPITOR) 40 MG tablet Take 1 tablet (40 mg total) by mouth daily. 30 tablet 2 10/30/2021 at 0800   citalopram (CELEXA) 20 MG tablet Take 20 mg by mouth daily.   10/30/2021 at 0800   clopidogrel (PLAVIX) 75 MG tablet Take 1 tablet (75 mg total) by mouth daily. 30 tablet 2 10/30/2021 at 0800   losartan (COZAAR) 100 MG tablet Take 100 mg by mouth daily.   10/30/2021 at 0800   MYRBETRIQ 50 MG TB24 tablet Take 1 tablet by mouth once daily 30 tablet 11 10/30/2021 at 0800   Vitamin D, Ergocalciferol, (DRISDOL) 50000 units CAPS capsule Take 50,000 Units by mouth once a week.    Past Week at Unknown    Social History   Socioeconomic History   Marital status: Married    Spouse name: Not on file   Number of children: Not on file   Years of education: Not on file   Highest education level: Not on file  Occupational History   Not on file  Tobacco Use   Smoking status: Former    Packs/day: 1.00    Types: Cigarettes   Smokeless tobacco: Never  Vaping Use   Vaping Use: Never used  Substance and Sexual Activity   Alcohol use: Not Currently   Drug use: Never   Sexual activity: Not on file  Other Topics Concern   Not on file  Social History Narrative   Not on file   Social  Determinants of Health   Financial Resource Strain: Not on file  Food Insecurity: Not on file  Transportation Needs: Not on file  Physical Activity: Not on file  Stress: Not on file  Social Connections: Not on file  Intimate Partner Violence: Not on file    Family History  Problem Relation Age of Onset   Dementia Mother    Heart attack Brother       Review of systems complete and found to be negative unless listed above      PHYSICAL EXAM  General: Well developed, well nourished, in no acute distress HEENT:  Normocephalic and atramatic Neck:  No JVD.  Lungs: Clear bilaterally to auscultation and  percussion. Heart: HRRR . Normal S1 and S2 without gallops or murmurs.  Abdomen: Bowel sounds are positive, abdomen soft and non-tender  Msk:  Back normal, normal gait. Normal strength and tone for age. Extremities: No clubbing, cyanosis or edema.   Neuro: Alert and oriented X 3. Psych:  Good affect, responds appropriately  Labs:   Lab Results  Component Value Date   WBC 13.3 (H) 11/02/2021   HGB 8.0 (L) 11/02/2021   HCT 25.6 (L) 11/02/2021   MCV 100.0 11/02/2021   PLT 238 11/02/2021    Recent Labs  Lab 10/31/21 0648 11/02/21 0451  NA 136 138  K 5.1 4.2  CL 106 112*  CO2 20* 17*  BUN 52* 58*  CREATININE 2.94* 2.74*  CALCIUM 9.2 8.2*  PROT 8.2*  --   BILITOT 0.5  --   ALKPHOS 48  --   ALT 23  --   AST 59*  --   GLUCOSE 118* 102*    Lab Results  Component Value Date   CKTOTAL 3,392 (H) 11/02/2021   TROPONINI < 0.02 03/17/2012     Lab Results  Component Value Date   CHOL 120 11/01/2021   CHOL 138 06/28/2020   CHOL 136 06/15/2020   Lab Results  Component Value Date   HDL 43 11/01/2021   HDL 53 06/28/2020   HDL 40 (L) 06/15/2020   Lab Results  Component Value Date   LDLCALC 63 11/01/2021   LDLCALC 73 06/28/2020   LDLCALC 76 06/15/2020   Lab Results  Component Value Date   TRIG 71 11/01/2021   TRIG 58 06/28/2020   TRIG 99 06/15/2020   Lab Results  Component Value Date   CHOLHDL 2.8 11/01/2021   CHOLHDL 2.6 06/28/2020   CHOLHDL 3.4 06/15/2020   No results found for: LDLDIRECT    Radiology: MR ANGIO HEAD WO CONTRAST  Result Date: 10/31/2021 CLINICAL DATA:  Neuro deficit, acute, stroke suspected EXAM: MRI HEAD WITHOUT CONTRAST MRA HEAD WITHOUT CONTRAST TECHNIQUE: Multiplanar, multi-echo pulse sequences of the brain and surrounding structures were acquired without intravenous contrast. Angiographic images of the Circle of Willis were acquired using MRA technique without intravenous contrast. COMPARISON:  10/15/2021 FINDINGS: MRI HEAD FINDINGS Brain:  No acute infarction or hemorrhage. Encephalomalacia and gliosis in the left cerebral hemisphere with involvement of frontal, parietal, temporal, and occipital lobes. Associated chronic blood products particularly in the temporal lobe. Ex vacuo dilatation of the left lateral ventricle. Chronic small vessel infarcts of the central gray nuclei, central white matter, pons, and cerebellum. Few additional scattered foci of susceptibility likely reflecting chronic microhemorrhages. Other stable confluent areas of T2 hyperintensity in the supratentorial and pontine white matter are nonspecific but probably reflect advanced chronic microvascular ischemic changes. Prominence of the ventricles and sulci reflects parenchymal volume loss.  Vascular: Susceptibility artifact related to left MCA aneurysm coiling. Skull and upper cervical spine: Marrow signal is within normal limits. Sinuses/Orbits: Chronic left sphenoid sinusitis. Bilateral lens replacements. Other: Suprasellar mass along pituitary infundibulum is not well evaluated on this study. Persistent bilateral mastoid effusions. MRA HEAD FINDINGS Anterior circulation: Intracranial internal carotid arteries are patent with atherosclerotic irregularity. Anterior cerebral arteries are patent. Middle cerebral arteries are patent. There is susceptibility artifact at site of left MCA bifurcation aneurysm coiling. No evidence of recurrence. Posterior circulation: Intracranial vertebral arteries are patent. The right vertebral artery terminates as a PICA. Basilar artery is patent. Posterior cerebral arteries are patent. Bilateral posterior communicating arteries are present. Fetal origin of the left PCA. IMPRESSION: No acute infarction or other acute abnormality. Stable chronic findings compared to recent prior study. No significant vascular abnormality. 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 Electronically Signed   By: Macy Mis M.D.    On: 10/31/2021 12:42   MR BRAIN WO CONTRAST  Result Date: 10/31/2021 CLINICAL DATA:  Neuro deficit, acute, stroke suspected EXAM: MRI HEAD WITHOUT CONTRAST MRA HEAD WITHOUT CONTRAST TECHNIQUE: Multiplanar, multi-echo pulse sequences of the brain and surrounding structures were acquired without intravenous contrast. Angiographic images of the Circle of Willis were acquired using MRA technique without intravenous contrast. COMPARISON:  10/15/2021 FINDINGS: MRI HEAD FINDINGS Brain: No acute infarction or hemorrhage. Encephalomalacia and gliosis in the left cerebral hemisphere with involvement of frontal, parietal, temporal, and occipital lobes. Associated chronic blood products particularly in the temporal lobe. Ex vacuo dilatation of the left lateral ventricle. Chronic small vessel infarcts of the central gray nuclei, central white matter, pons, and cerebellum. Few additional scattered foci of susceptibility likely reflecting chronic microhemorrhages. Other stable confluent areas of T2 hyperintensity in the supratentorial and pontine white matter are nonspecific but probably reflect advanced chronic microvascular ischemic changes. Prominence of the ventricles and sulci reflects parenchymal volume loss. Vascular: Susceptibility artifact related to left MCA aneurysm coiling. Skull and upper cervical spine: Marrow signal is within normal limits. Sinuses/Orbits: Chronic left sphenoid sinusitis. Bilateral lens replacements. Other: Suprasellar mass along pituitary infundibulum is not well evaluated on this study. Persistent bilateral mastoid effusions. MRA HEAD FINDINGS Anterior circulation: Intracranial internal carotid arteries are patent with atherosclerotic irregularity. Anterior cerebral arteries are patent. Middle cerebral arteries are patent. There is susceptibility artifact at site of left MCA bifurcation aneurysm coiling. No evidence of recurrence. Posterior circulation: Intracranial vertebral arteries are  patent. The right vertebral artery terminates as a PICA. Basilar artery is patent. Posterior cerebral arteries are patent. Bilateral posterior communicating arteries are present. Fetal origin of the left PCA. IMPRESSION: No acute infarction or other acute abnormality. Stable chronic findings compared to recent prior study. No significant vascular abnormality. 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 Electronically Signed   By: Macy Mis M.D.   On: 10/31/2021 12:42   MR BRAIN W WO CONTRAST  Result Date: 10/15/2021 CLINICAL DATA:  Follow-up suprasellar mass. EXAM: MRI HEAD WITHOUT AND WITH CONTRAST TECHNIQUE: Multiplanar, multiecho pulse sequences of the brain and surrounding structures were obtained without and with intravenous contrast. 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 This is in the midline and is in the area of the infundibulum. This is just above the pituitary but appears separate from the pituitary which otherwise appears normal. Generalized atrophy  with ventricular enlargement which is similar to the prior study. There is encephalomalacia and chronic infarction in the left frontal and temporal lobe. Chronic blood products are present in the left temporal infarct. Prior aneurysm coiling in the left MCA. Advanced chronic microvascular ischemic changes in the white matter. Negative for acute infarct. Vascular: Normal arterial flow voids. Aneurysm coiling left MCA bifurcation region. Skull and upper cervical spine: No focal lesion. Sinuses/Orbits: Mucosal edema in the paranasal sinuses. Bilateral mastoid effusion. 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 This is just above the pituitary and appears separate from the pituitary.  Extensive atrophy. Extensive chronic microvascular ischemic change. Chronic left MCA infarct. Left MCA bifurcation aneurysm coiling. Electronically Signed   By: Franchot Gallo M.D.   On: 10/15/2021 16:05   US Carotid Bilateral (at [facility] and [facility] only)  Result Date: 10/31/2021 CLINICAL DATA:  Stroke, hyperlipidemia, former smoker EXAM: BILATERAL CAROTID DUPLEX ULTRASOUND TECHNIQUE: Pearline Cables scale imaging, color Doppler and duplex ultrasound were performed of bilateral carotid and vertebral arteries in the neck. COMPARISON:  None. FINDINGS: Criteria: Quantification of carotid stenosis is based on velocity parameters that correlate the residual internal carotid diameter with NASCET-based stenosis levels, using the diameter of the distal internal carotid lumen as the denominator for stenosis measurement. The following velocity measurements were obtained: RIGHT ICA: 71/27 cm/sec CCA: 30/07 cm/sec SYSTOLIC ICA/CCA RATIO:  1.0 ECA: 47 cm/sec LEFT ICA: 127/36 cm/sec CCA: 62/26 cm/sec SYSTOLIC ICA/CCA RATIO:  2.8 ECA: 46 cm/sec RIGHT CAROTID ARTERY: Moderate mixed echogenicity carotid bifurcation atherosclerosis. Despite this, no significant ICA stenosis, velocity elevation, turbulent flow. Degree of narrowing less than 50% by ultrasound criteria. RIGHT VERTEBRAL ARTERY:  Normal antegrade flow LEFT CAROTID ARTERY: Similar moderate mixed echogenicity irregular bifurcation atherosclerosis. Negative for significant stenosis, velocity elevation, turbulent flow. Degree of narrowing also less than 50% by ultrasound criteria. LEFT VERTEBRAL ARTERY:  Normal antegrade flow IMPRESSION: Bilateral carotid atherosclerosis. Negative for significant ICA stenosis. Degree of narrowing less than 50% bilaterally by ultrasound criteria. Patent antegrade vertebral flow bilaterally Electronically Signed   By: Jerilynn Mages.  Shick M.D.   On: 10/31/2021 14:25   DG Chest Portable 1 View  Result Date: 10/31/2021 CLINICAL DATA:  75 year old female code stroke  presentation. Cough for 1 week. EXAM: PORTABLE CHEST 1 VIEW COMPARISON:  Chest radiographs 03/17/2012. FINDINGS: Portable AP upright view at 0652 hours. Chronic large lung volumes. Mild cardiomegaly appears new since 2013. Other mediastinal contours are within normal limits. Visualized tracheal air column is within normal limits. Chronic but increased and coarse bilateral pulmonary interstitial opacity, fairly symmetric and with some basilar predominance. No pneumothorax, pleural effusion or confluent opacity. No acute osseous abnormality identified. Negative visible bowel gas. IMPRESSION: Chronic hyperinflation with acute on chronic bilateral pulmonary interstitial opacity. Top differential considerations include viral/atypical respiratory infection, pulmonary edema, progressed chronic lung disease. Mild cardiomegaly.  No pleural effusion. Electronically Signed   By: Genevie Ann M.D.   On: 10/31/2021 07:14   ECHOCARDIOGRAM COMPLETE  Result Date: 10/31/2021    ECHOCARDIOGRAM REPORT   Patient Name:   Linda [name] Date of Exam: 10/31/2021 Medical Rec #:  [id number]      Height:       66.0 in Accession #:    [id number]     Weight:       160.3 lb Date of Birth:  Mar 03, 1946      BSA:          1.820  m Patient Age:    76 years       BP:           145/87 mmHg Patient Gender: F              HR:           85 bpm. Exam Location:  [facility] Procedure: 2D Echo, Cardiac Doppler and Color Doppler Indications:     Stroke I63.9  History:         Patient has no prior history of Echocardiogram examinations.                  Stroke; Risk Factors:Dyslipidemia. CKD.  Sonographer:     Sherrie Sport Referring Phys:  Unknown Foley NIU Diagnosing Phys: Donnelly Angelica  Sonographer Comments: Suboptimal apical window. IMPRESSIONS  1. Left ventricular ejection fraction, by estimation, is 30 to 35%. The left ventricle has moderately decreased function. The left ventricle demonstrates regional wall motion abnormalities (see scoring diagram/findings for  description). Left ventricular  diastolic parameters are consistent with Grade I diastolic dysfunction (impaired relaxation).  2. Right ventricular systolic function is normal. The right ventricular size is normal.  3. The mitral valve is normal in structure. Mild mitral valve regurgitation. No evidence of mitral stenosis.  4. The aortic valve was not well visualized. Aortic valve regurgitation is not visualized. No aortic stenosis is present. 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 FINDINGS  Left Ventricle: Left ventricular ejection fraction, by estimation, is 30 to 35%. The left ventricle has moderately decreased function. The left ventricle demonstrates regional wall motion abnormalities. The left ventricular internal cavity size was normal in size. There is no left ventricular hypertrophy. Left ventricular diastolic parameters are consistent with Grade I diastolic dysfunction (impaired relaxation).  LV Wall Scoring: The entire septum is akinetic. The basal anterior segment is hypokinetic. Right Ventricle: The right ventricular size is normal. No increase in right ventricular wall thickness. Right ventricular systolic function is normal. Left Atrium: Left atrial size was normal in size. Right Atrium: Right atrial size was normal in size. Pericardium: There is no evidence of pericardial effusion. Mitral Valve: The mitral valve is normal in structure. Mild mitral valve regurgitation. No evidence of mitral valve stenosis. MV peak gradient, 5.9 mmHg. The mean mitral valve gradient is 2.0 mmHg. Tricuspid Valve: The tricuspid valve is normal in structure. Tricuspid valve regurgitation is mild. Aortic Valve: The aortic valve was not well visualized. Aortic valve regurgitation is not visualized. No aortic stenosis is present. Aortic valve mean gradient measures 1.5 mmHg. Aortic valve peak gradient measures 3.3 mmHg. Aortic valve area, by VTI measures 4.42  cm. Pulmonic Valve: The pulmonic valve was not well visualized. Pulmonic valve regurgitation is not visualized. Aorta: The aortic root was not well visualized. IAS/Shunts: The interatrial septum was not assessed.  LEFT VENTRICLE PLAX 2D LVIDd:         4.00 cm     Diastology LVIDs:         3.40 cm     LV e' medial:    3.92 cm/s LV PW:         1.20 cm     LV E/e' medial:  12.6 LV IVS:        0.80 cm     LV e' lateral:   5.00 cm/s LVOT diam:     2.10 cm     LV E/e' lateral: 9.9 LV SV:  63 LV SV Index:   35 LVOT Area:     3.46 cm  LV Volumes (MOD) LV vol d, MOD A2C: 84.5 ml LV vol d, MOD A4C: 64.1 ml LV vol s, MOD A2C: 71.1 ml LV vol s, MOD A4C: 34.9 ml LV SV MOD A2C:     13.4 ml LV SV MOD A4C:     64.1 ml LV SV MOD BP:      24.6 ml RIGHT VENTRICLE RV Basal diam:  3.50 cm RV S prime:     10.30 cm/s TAPSE (M-mode): 3.4 cm LEFT ATRIUM             Index        RIGHT ATRIUM           Index LA diam:        3.00 cm 1.65 cm/m   RA Area:     10.40 cm LA Vol (A2C):   48.2 ml 26.48 ml/m  RA Volume:   21.10 ml  11.59 ml/m LA Vol (A4C):   67.4 ml 37.03 ml/m LA Biplane Vol: 62.3 ml 34.23 ml/m  AORTIC VALVE                    PULMONIC VALVE AV Area (Vmax):    3.78 cm     PV Vmax:        0.59 m/s AV Area (Vmean):   3.81 cm     PV Vmean:       38.300 cm/s AV Area (VTI):     4.42 cm     PV VTI:         0.083 m AV Vmax:           90.35 cm/s   PV Peak grad:   1.4 mmHg AV Vmean:          59.950 cm/s  PV Mean grad:   1.0 mmHg AV VTI:            0.144 m      RVOT Peak grad: 2 mmHg AV Peak Grad:      3.3 mmHg AV Mean Grad:      1.5 mmHg LVOT Vmax:         98.60 cm/s LVOT Vmean:        66.000 cm/s LVOT VTI:          0.183 m LVOT/AV VTI ratio: 1.28  AORTA Ao Root diam: 2.70 cm MITRAL VALVE               TRICUSPID VALVE MV Area (PHT): 5.34 cm    TR Peak grad:   43.8 mmHg MV Area VTI:   3.88 cm    TR Vmax:        331.00 cm/s MV Peak grad:  5.9 mmHg MV Mean grad:  2.0 mmHg    SHUNTS MV Vmax:       1.21 m/s    Systemic VTI:   0.18 m MV Vmean:      70.4 cm/s   Systemic Diam: 2.10 cm MV Decel Time: 142 msec    Pulmonic VTI:  0.116 m MV E velocity: 49.30 cm/s MV A velocity: 84.80 cm/s MV E/A ratio:  0.58 Donnelly Angelica Electronically signed by Donnelly Angelica Signature Date/Time: 10/31/2021/3:14:43 PM    Final    CT HEAD CODE STROKE WO CONTRAST  Result Date: 10/31/2021 CLINICAL DATA:  Code stroke.  75 year old female with aphasia. EXAM: CT HEAD WITHOUT CONTRAST TECHNIQUE: Contiguous axial images  were obtained from the base of the skull through the vertex without intravenous contrast. COMPARISON:  Recent brain MRI 10/15/2021.  Head CT 06/27/2020. 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 Fairly extensive chronic encephalomalacia in the left hemisphere involving both the MCA and PCA territories. Superimposed advanced bilateral cerebral white matter hypodensity. Small area of chronic encephalomalacia in the anterior right MCA territory. Advanced small vessel disease throughout the bilateral deep gray nuclei. And chronic small vessel disease also in the brainstem and bilateral cerebellar hemispheres. Ex vacuo ventricular enlargement is stable from earlier this month. No superimposed midline shift, mass effect, intracranial hemorrhage or evidence of cortically based acute infarction. 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 Vascular: Calcified atherosclerosis at the skull base. 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 No suspicious intracranial vascular hyperdensity. Skull: No acute osseous abnormality identified. Sinuses/Orbits: Chronic sinusitis with scattered mucoperiosteal thickening has not significantly changed from last year. Mild right mastoid effusion not significantly changed. Other: No acute orbit or scalp soft tissue finding. ASPECTS [facility] Stroke Program Early CT Score) Total score (0-10 with 10 being normal): 10 (chronic  encephalomalacia) IMPRESSION: 1. Severe chronic ischemic disease. No acute cortically based infarct or acute intracranial hemorrhage identified. 2. Previous left MCA aneurysm coil embolization. Study discussed by telephone with Dr. Pryor Curia on 10/31/2021 at 06:36 . Electronically Signed   By: Genevie Ann M.D.   On: 10/31/2021 06:37    EKG: Sinus rhythm with nonspecific ST and T wave abnormalities; there is 1 mm of ST elevation in lead V4 but isolated.  Echo 10/31/21- 1. Left ventricular ejection fraction, by estimation, is 30 to 35%. The  left ventricle has moderately decreased function. The left ventricle  demonstrates regional wall motion abnormalities (see scoring  diagram/findings for description). Left ventricular   diastolic parameters are consistent with Grade I diastolic dysfunction  (impaired relaxation).   2. Right ventricular systolic function is normal. The right ventricular  size is normal.   3. The mitral valve is normal in structure. Mild mitral valve  regurgitation. No evidence of mitral stenosis.   4. The aortic valve was not well visualized. Aortic valve regurgitation  is not visualized. No aortic stenosis is present.   ASSESSMENT AND PLAN:  Brendan Gruwell is a 75 year old female with history of CVA with residual aphasia, hypertension, hyperlipidemia, dementia, CKD 4, chronic diastolic heart failure who was admitted with worse facial droop and difficulty walking.  Cardiology is consulted because the troponin is elevated to 7000 and echocardiogram shows a hypokinetic anterior wall and apex.  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  As part of her stroke evaluation echocardiogram was completed that showed an akinetic anteroseptal wall and apex and a troponin that was elevated to 6500 and decreasing therafter.  Per report she did not have any chest pain prior to this, and she very  clearly denies chest pain at this time.  Most likely this is Takotsubo in the setting of her stroke/TIA, but but it is also reasonable to consider missed MI in the last several days.  Given her renal insufficiency and neurologic findings we will manage conservatively.  We will consider an ischemic evaluation depending on how her renal function improves. -Continue aspirin and Plavix for 12 months; aspirin indefinitely -Agree with heparin infusion x48 hours ending today. -Continue Lipitor 40 mg daily -Metoprolol XL 12.5 mg  daily  - No ACE/ARB d/t renal function -She will need an ischemic evaluation, but renal function is a barrier. Additionally patient is adamant to go home. Will plan for close outpatient follow up and determination for ischemic evaluation at that time.   Signed: Andrez Grime MD 11/02/2021, 8:31 AM

## 2021-11-02 NOTE — Progress Notes (Signed)
[facility]  ROUNDING NOTE   Subjective:   Ms. BRUNETTE LAVALLE was admitted to [facility] on 10/31/2021 for Aphasia [R47.01] TIA (transient ischemic attack) [G45.9] Stroke [facility]) [I63.9] NSTEMI (non-ST elevated myocardial infarction) ([place]) [I21.4] Fall, initial encounter [W19.XXXA] Altered mental status, unspecified altered mental status type [R41.82]  Patient was admitted to new onset facial droop and abnormal gait. Found to have acute coronary syndrome on admission. Patient was diagnosed with Takotsubo cardiomyopathy. Also found to have rhabdomyolysis.   Started on IV fluids: normal saline at 85mL/hr  Patient placed on empiric antibiotics for urinary tract infection and pneumonia.   Patient started on clopidogrel this admission.   Patient followed for chronic kidney disease stage IV by Dr. Holley Raring.    Objective:  Vital signs in last 24 hours:  Temp:  [97.7 F (36.5 C)-98.1 F (36.7 C)] 97.8 F (36.6 C) (11/20 1151) Pulse Rate:  [62-76] 65 (11/20 1151) Resp:  [16-20] 18 (11/20 1151) BP: (124-154)/(62-73) 134/73 (11/20 1151) SpO2:  [95 %-100 %] 100 % (11/20 1151)  Weight change:  Filed Weights   10/31/21 0703  Weight: 72.7 kg    Intake/Output: I/O last 3 completed shifts: In: 1814.2 [P.O.:600; I.V.:666.3; IV Piggyback:547.9] Out: 1450 [Urine:1450]   Intake/Output this shift:  Total I/O In: 1711 [P.O.:220; I.V.:1291; IV Piggyback:200] Out: 1225 [Urine:1225]  Physical Exam: General: NAD, sitting up in bed  Head: Normocephalic, atraumatic. Moist oral mucosal membranes  Eyes: Anicteric, PERRL  Neck: Supple, trachea midline  Lungs:  Clear to auscultation, room air  Heart: Regular rate and rhythm  Abdomen:  Soft, nontender,   Extremities:  trace peripheral edema.  Neurologic: Nonfocal, moving all four extremities, following commands. Right sided weakness.   Skin: No lesions        Basic Metabolic Panel: Recent Labs  Lab 10/31/21 0648 11/02/21 0451   NA 136 138  K 5.1 4.2  CL 106 112*  CO2 20* 17*  GLUCOSE 118* 102*  BUN 52* 58*  CREATININE 2.94* 2.74*  CALCIUM 9.2 8.2*    Liver Function Tests: Recent Labs  Lab 10/31/21 0648  AST 59*  ALT 23  ALKPHOS 48  BILITOT 0.5  PROT 8.2*  ALBUMIN 3.9   No results for input(s): LIPASE, AMYLASE in the last 168 hours. No results for input(s): AMMONIA in the last 168 hours.  CBC: Recent Labs  Lab 10/31/21 0648 11/01/21 0606 11/02/21 0451  WBC 14.4* 14.0* 13.3*  NEUTROABS 12.4*  --   --   HGB 10.5* 9.8* 8.0*  HCT 33.0* 29.9* 25.6*  MCV 99.1 96.8 100.0  PLT 291 255 238    Cardiac Enzymes: Recent Labs  Lab 10/31/21 0648 11/01/21 0606 11/02/21 0451  CKTOTAL 2,335* 3,287* 3,392*    BNP: Invalid input(s): POCBNP  CBG: Recent Labs  Lab 10/31/21 0622  GLUCAP 127*    Microbiology: Results for orders placed or performed during the hospital encounter of 10/31/21  Resp Panel by RT-PCR (Flu A&B, Covid) Nasopharyngeal Swab     Status: None   Collection Time: 10/31/21 12:48 AM   Specimen: Nasopharyngeal Swab; Nasopharyngeal(NP) swabs in vial transport medium  Result Value Ref Range Status   SARS Coronavirus 2 by RT PCR NEGATIVE NEGATIVE Final    Comment: (NOTE) SARS-CoV-2 target nucleic acids are NOT DETECTED.  The SARS-CoV-2 RNA is generally detectable in upper respiratory specimens during the acute phase of infection. The lowest concentration of SARS-CoV-2 viral copies this assay can detect is 138 copies/mL. A negative result does  not preclude SARS-Cov-2 infection and should not be used as the sole basis for treatment or other patient management decisions. A negative result may occur with  improper specimen collection/handling, submission of specimen other than nasopharyngeal swab, presence of viral mutation(s) within the areas targeted by this assay, and inadequate number of viral copies(<138 copies/mL). A negative result must be combined with clinical  observations, patient history, and epidemiological information. The expected result is Negative.  Fact Sheet for Patients:  EntrepreneurPulse.com.au  Fact Sheet for Healthcare Providers:  IncredibleEmployment.be  This test is no t yet approved or cleared by the [place] FDA and  has been authorized for detection and/or diagnosis of SARS-CoV-2 by FDA under an Emergency Use Authorization (EUA). This EUA will remain  in effect (meaning this test can be used) for the duration of the COVID-19 declaration under Section 564(b)(1) of the Act, 21 U.S.C.section 360bbb-3(b)(1), unless the authorization is terminated  or revoked sooner.       Influenza A by PCR NEGATIVE NEGATIVE Final   Influenza B by PCR NEGATIVE NEGATIVE Final    Comment: (NOTE) The Xpert Xpress SARS-CoV-2/FLU/RSV plus assay is intended as an aid in the diagnosis of influenza from Nasopharyngeal swab specimens and should not be used as a sole basis for treatment. Nasal washings and aspirates are unacceptable for Xpert Xpress SARS-CoV-2/FLU/RSV testing.  Fact Sheet for Patients: EntrepreneurPulse.com.au  Fact Sheet for Healthcare Providers: IncredibleEmployment.be  This test is not yet approved or cleared by the [place] FDA and has been authorized for detection and/or diagnosis of SARS-CoV-2 by FDA under an Emergency Use Authorization (EUA). This EUA will remain in effect (meaning this test can be used) for the duration of the COVID-19 declaration under Section 564(b)(1) of the Act, 21 U.S.C. section 360bbb-3(b)(1), unless the authorization is terminated or revoked.  Performed at [facility], [address] [place], [place] [address]   Culture, blood (x 2)     Status: None (Preliminary result)   Collection Time: 10/31/21 12:49 PM   Specimen: BLOOD  Result Value Ref Range Status   Specimen Description BLOOD  RIGHT [facility]   Final   Special Requests BOTTLES DRAWN AEROBIC AND ANAEROBIC BCLV  Final   Culture   Final    NO GROWTH 2 DAYS Performed at [facility], [address], [place], [place] [address]    Report Status PENDING  Incomplete  Culture, blood (x 2)     Status: None (Preliminary result)   Collection Time: 10/31/21 12:49 PM   Specimen: BLOOD  Result Value Ref Range Status   Specimen Description BLOOD LEFT HAND  Final   Special Requests BOTTLES DRAWN AEROBIC AND ANAEROBIC BCLV  Final   Culture   Final    NO GROWTH 2 DAYS Performed at [facility], [address] [place], [place] [address]    Report Status PENDING  Incomplete  Urine Culture     Status: Abnormal (Preliminary result)   Collection Time: 10/31/21  3:53 PM   Specimen: Urine, Random  Result Value Ref Range Status   Specimen Description   Final    URINE, RANDOM Performed at [facility], [address], [place], [place] [address]    Special Requests   Final    NONE Performed at [facility], [address] [place],  [address]    Culture (A)  Final    >=100,000 COLONIES/mL ESCHERICHIA COLI SUSCEPTIBILITIES TO FOLLOW Performed at [facility], [place]  [address], [place], [place] [address]    Report Status PENDING  Incomplete    Coagulation Studies: Recent Labs    10/31/21 0648  LABPROT 14.0  INR 1.1    Urinalysis: Recent Labs    10/31/21 0946  COLORURINE YELLOW*  LABSPEC 1.011  PHURINE 5.0  GLUCOSEU NEGATIVE  HGBUR LARGE*  BILIRUBINUR NEGATIVE  KETONESUR NEGATIVE  PROTEINUR 30*  NITRITE NEGATIVE  LEUKOCYTESUR TRACE*      Imaging: US Carotid Bilateral (at [facility] and [facility] only)  Result Date: 10/31/2021 CLINICAL DATA:  Stroke, hyperlipidemia, former smoker EXAM: BILATERAL CAROTID DUPLEX ULTRASOUND TECHNIQUE: Pearline Cables scale imaging, color Doppler and duplex ultrasound were performed of bilateral carotid and vertebral arteries in the neck. COMPARISON:  None.  FINDINGS: Criteria: Quantification of carotid stenosis is based on velocity parameters that correlate the residual internal carotid diameter with NASCET-based stenosis levels, using the diameter of the distal internal carotid lumen as the denominator for stenosis measurement. The following velocity measurements were obtained: RIGHT ICA: 71/27 cm/sec CCA: 91/47 cm/sec SYSTOLIC ICA/CCA RATIO:  1.0 ECA: 47 cm/sec LEFT ICA: 127/36 cm/sec CCA: 82/95 cm/sec SYSTOLIC ICA/CCA RATIO:  2.8 ECA: 46 cm/sec RIGHT CAROTID ARTERY: Moderate mixed echogenicity carotid bifurcation atherosclerosis. Despite this, no significant ICA stenosis, velocity elevation, turbulent flow. Degree of narrowing less than 50% by ultrasound criteria. RIGHT VERTEBRAL ARTERY:  Normal antegrade flow LEFT CAROTID ARTERY: Similar moderate mixed echogenicity irregular bifurcation atherosclerosis. Negative for significant stenosis, velocity elevation, turbulent flow. Degree of narrowing also less than 50% by ultrasound criteria. LEFT VERTEBRAL ARTERY:  Normal antegrade flow IMPRESSION: Bilateral carotid atherosclerosis. Negative for significant ICA stenosis. Degree of narrowing less than 50% bilaterally by ultrasound criteria. Patent antegrade vertebral flow bilaterally Electronically Signed   By: Jerilynn Mages.  Shick M.D.   On: 10/31/2021 14:25   DG HIP UNILAT WITH PELVIS 2-3 VIEWS RIGHT  Result Date: 11/02/2021 CLINICAL DATA:  Right hip pain of unknown origin.  Dementia. EXAM: DG HIP (WITH OR WITHOUT PELVIS) 2-3V RIGHT COMPARISON:  None. FINDINGS: Exam demonstrates diffuse decreased bone mineralization. There mild symmetric degenerative changes of the hips. No evidence of acute fracture or dislocation. No focal bony abnormality over the right hip. Degenerative changes of the spine and sacroiliac joints. IMPRESSION: 1. No acute findings. 2. Mild symmetric degenerative change of the hips. Electronically Signed   By: Marin Olp M.D.   On: 11/02/2021 08:45      Medications:    sodium chloride Stopped (11/02/21 1004)   azithromycin 500 mg (11/02/21 1124)   cefTRIAXone (ROCEPHIN)  IV 1 g (11/02/21 1004)   metronidazole 500 mg (11/02/21 1249)     stroke: mapping our early stages of recovery book   Does not apply Once   aspirin EC  81 mg Oral Daily   citalopram  20 mg Oral Daily   clopidogrel  75 mg Oral Daily   metoprolol succinate  12.5 mg Oral Daily   mirabegron ER  50 mg Oral Daily   acetaminophen **OR** acetaminophen (TYLENOL) oral liquid 160 mg/5 mL **OR** acetaminophen, albuterol, dextromethorphan-guaiFENesin, hydrALAZINE, HYDROcodone-acetaminophen, ondansetron (ZOFRAN) IV, senna-docusate  Assessment/ Plan:  Ms. KIMIMILA TAUZIN is a 75 y.o. white female with CVA with residual receptive aphasia,  cerebral aneurysm, hypertension, hyperlipidemia, depression, over active bladder, and pituitary mass who is admitted to [facility] on 10/31/2021 for Aphasia [R47.01] TIA (transient ischemic attack) [G45.9] Stroke [facility]) [I63.9] NSTEMI (non-ST elevated myocardial infarction) ([place]) [I21.4] Fall, initial encounter [W19.XXXA] Altered mental status, unspecified altered mental status type [R41.82]  Acute  kidney injury on chronic kidney disease stage IV with proteinuria: baseline creatinine of 2.6, GFR of 19 on 08/19/2021. Chronic kidney disease secondary to hypertension and arthrosclerotic disease. Acute kidney injury secondary to rhabdomyolysis.  - holding losartan - Continue IV fluids  Hypertension: 134/73. Home regimen of losartan and amlodipine. Holding both agents currently.   Anemia of chronic kidney disease: hemoglobin 8. Normocytic.  - Check iron studies.   Cardiomyopathy: diagnosed with Takotsubo cardiomyopathy. Echocardiogram with EF of 30-35% and grade I diastolic congestive heart failure.  - Appreciate cardiology input.  - monitor volume status closely with IV fluids running.   Urinary tract infection: E. Coli growing from urine culture.  Current empiric antibiotics of ceftriaxone, metronidazole and azithromycin.   Rhabdomyolysis: trending up.  - holding atorvastatin - Continue IV fluids as above.   Pneumonia: possibly secondary to aspiration. Empiric antibiotics of ceftriaxone, metronidazole, and azithromycin.  - Continue supportive care and oxygen. Breathing room air this morning.    LOS: 2 Ludell Zacarias 11/20/20221:43 PM

## 2021-11-02 NOTE — Progress Notes (Signed)
ANTICOAGULATION CONSULT NOTE  Pharmacy Consult for heparin infusion Indication: chest pain/ACS  No Known Allergies  Patient Measurements: Height: 5\' 6"  (167.6 cm) Weight: 72.7 kg (160 lb 4.4 oz) IBW/kg (Calculated) : 59.3 Heparin Dosing Weight: 72.7 kg  Vital Signs: Temp: 98 F (36.7 C) (11/20 0414) Temp Source: Oral (11/20 0414) BP: 124/69 (11/20 0414) Pulse Rate: 69 (11/20 0414)  Labs: Recent Labs    10/31/21 0648 10/31/21 1522 10/31/21 2300 10/31/21 2355 10/31/21 2356 11/01/21 0606 11/01/21 1113 11/01/21 1948 11/02/21 0451  HGB 10.5*  --   --   --   --  9.8*  --   --  8.0*  HCT 33.0*  --   --   --   --  29.9*  --   --  25.6*  PLT 291  --   --   --   --  255  --   --  238  APTT 28  --   --   --   --   --   --   --   --   LABPROT 14.0  --   --   --   --   --   --   --   --   INR 1.1  --   --   --   --   --   --   --   --   HEPARINUNFRC  --   --   --   --    < >  --  0.65 0.43 0.20*  CREATININE 2.94*  --   --   --   --   --   --   --   --   CKTOTAL 2,335*  --   --   --   --  3,287*  --   --   --   TROPONINIHS  --    < > 4,565* 4,267*  --  3,743*  --   --   --    < > = values in this interval not displayed.     Estimated Creatinine Clearance: 16.6 mL/min (A) (by C-G formula based on SCr of 2.94 mg/dL (H)).   Medical History: Past Medical History:  Diagnosis Date   Anxiety    Benign neoplasm of colon 03/27/2014   Brain aneurysm 2006   Cerebrovascular disease 06/28/2014   Chronic kidney disease    Dementia, vascular ([place]) 06/19/2014   Depression    Hyperlipidemia 02/20/2019   Pure hypercholesterolemia 03/27/2014   Receptive aphasia    Sleep apnea    Stroke [facility])    Vascular dementia ([place])    Wears dentures     Medications:  Per chart review, no anticoagulation PTA  Assessment: 75 y.o. female with history of hypertension, hyperlipidemia, chronic kidney disease, CVA, left MCA aneurysm status post coiling, and dementia. Pt was on SQ heparin and last dose was  11/18 1325. Pharmacy has been consulted for heparin. HS Trop [id number].   Baseline labs: aPTT, PT-INR and Plt WNL; Hgb 10.5, Hct 33  Goal of Therapy:  Heparin level 0.3-0.7 units/ml Monitor platelets by anticoagulation protocol: Yes  11/18 2356 HL 0.95, supratherapeutic 11/19 1113 HL 0.65, therapeutic 11/19 1948 HL 0.43, therapeutic x 2 11/20 0451 HL 0.20, subtherapeutic   Plan:  Bolus 1100 units x 1 Increase heparin infusion to 850 units/hr Recheck HL in 8 hr after rate change. Monitor daily heparin level, CBC, s/s of bleed  Renda Rolls, PharmD, [facility] 11/02/2021 6:07 AM

## 2021-11-02 NOTE — Assessment & Plan Note (Signed)
Echo shows EF of 30 to 35%.  Continue metoprolol, aspirin.  Heparin infusion for 48 hours.  Unable to restart losartan due to renal dysfunction.  Cannot give Lipitor due to rhabdo.  Stop heparin if cardiology is okay

## 2021-11-02 NOTE — Assessment & Plan Note (Signed)
CK of 3287> 3392.  Monitor.  Hold statin.  Continue IV fluids and monitor CK

## 2021-11-02 NOTE — Assessment & Plan Note (Signed)
On Rocephin.  Pending urine culture.

## 2021-11-02 NOTE — Assessment & Plan Note (Signed)
Baseline creatinine around 1.6-1.9.  Admission creatinine of 2.94> 2.74  Continue IV hydration. nephrology consulted and seen

## 2021-11-02 NOTE — Assessment & Plan Note (Signed)
Lipitor on hold due to elevated CK/rhabdo.

## 2021-11-03 DIAGNOSIS — I5181 Takotsubo syndrome: Secondary | ICD-10-CM | POA: Diagnosis not present

## 2021-11-03 DIAGNOSIS — D6489 Other specified anemias: Secondary | ICD-10-CM

## 2021-11-03 LAB — CBC WITH DIFFERENTIAL/PLATELET
Abs Immature Granulocytes: 0.13 10*3/uL — ABNORMAL HIGH (ref 0.00–0.07)
Basophils Absolute: 0.1 10*3/uL (ref 0.0–0.1)
Basophils Relative: 1 %
Eosinophils Absolute: 0.2 10*3/uL (ref 0.0–0.5)
Eosinophils Relative: 2 %
HCT: 23.6 % — ABNORMAL LOW (ref 36.0–46.0)
Hemoglobin: 7.7 g/dL — ABNORMAL LOW (ref 12.0–15.0)
Immature Granulocytes: 1 %
Lymphocytes Relative: 23 %
Lymphs Abs: 2.7 10*3/uL (ref 0.7–4.0)
MCH: 32.1 pg (ref 26.0–34.0)
MCHC: 32.6 g/dL (ref 30.0–36.0)
MCV: 98.3 fL (ref 80.0–100.0)
Monocytes Absolute: 0.8 10*3/uL (ref 0.1–1.0)
Monocytes Relative: 7 %
Neutro Abs: 7.9 10*3/uL — ABNORMAL HIGH (ref 1.7–7.7)
Neutrophils Relative %: 66 %
Platelets: 242 10*3/uL (ref 150–400)
RBC: 2.4 MIL/uL — ABNORMAL LOW (ref 3.87–5.11)
RDW: 14 % (ref 11.5–15.5)
WBC: 11.8 10*3/uL — ABNORMAL HIGH (ref 4.0–10.5)
nRBC: 0.2 % (ref 0.0–0.2)

## 2021-11-03 LAB — RETICULOCYTES
Immature Retic Fract: 36.1 % — ABNORMAL HIGH (ref 2.3–15.9)
RBC.: 2.39 MIL/uL — ABNORMAL LOW (ref 3.87–5.11)
Retic Count, Absolute: 55.9 10*3/uL (ref 19.0–186.0)
Retic Ct Pct: 2.3 % (ref 0.4–3.1)

## 2021-11-03 LAB — FOLATE: Folate: 6.4 ng/mL (ref >5.9)

## 2021-11-03 LAB — CBC
HCT: 23.2 % — ABNORMAL LOW (ref 36.0–46.0)
HCT: 26.8 % — ABNORMAL LOW (ref 36.0–46.0)
Hemoglobin: 7.5 g/dL — ABNORMAL LOW (ref 12.0–15.0)
Hemoglobin: 8.8 g/dL — ABNORMAL LOW (ref 12.0–15.0)
MCH: 31.9 pg (ref 26.0–34.0)
MCH: 32.6 pg (ref 26.0–34.0)
MCHC: 32.3 g/dL (ref 30.0–36.0)
MCHC: 32.8 g/dL (ref 30.0–36.0)
MCV: 98.7 fL (ref 80.0–100.0)
MCV: 99.3 fL (ref 80.0–100.0)
Platelets: 226 10*3/uL (ref 150–400)
Platelets: 204 10*3/uL (ref 150–400)
RBC: 2.35 MIL/uL — ABNORMAL LOW (ref 3.87–5.11)
RBC: 2.7 MIL/uL — ABNORMAL LOW (ref 3.87–5.11)
RDW: 14.2 % (ref 11.5–15.5)
RDW: 13.9 % (ref 11.5–15.5)
WBC: 11.6 10*3/uL — ABNORMAL HIGH (ref 4.0–10.5)
WBC: 11.2 10*3/uL — ABNORMAL HIGH (ref 4.0–10.5)
nRBC: 0.3 % — ABNORMAL HIGH (ref 0.0–0.2)
nRBC: 0.4 % — ABNORMAL HIGH (ref 0.0–0.2)

## 2021-11-03 LAB — BASIC METABOLIC PANEL
Anion gap: 6 (ref 5–15)
BUN: 53 mg/dL — ABNORMAL HIGH (ref 8–23)
CO2: 16 mmol/L — ABNORMAL LOW (ref 22–32)
Calcium: 8.1 mg/dL — ABNORMAL LOW (ref 8.9–10.3)
Chloride: 115 mmol/L — ABNORMAL HIGH (ref 98–111)
Creatinine, Ser: 2.66 mg/dL — ABNORMAL HIGH (ref 0.44–1.00)
GFR, Estimated: 18 mL/min — ABNORMAL LOW (ref >60)
Glucose, Bld: 105 mg/dL — ABNORMAL HIGH (ref 70–99)
Potassium: 3.5 mmol/L (ref 3.5–5.1)
Sodium: 137 mmol/L (ref 135–145)

## 2021-11-03 LAB — CK: Total CK: 3079 U/L — ABNORMAL HIGH (ref 38–234)

## 2021-11-03 LAB — URINE CULTURE: Culture: >=100000 — AB

## 2021-11-03 LAB — ABO/RH: ABO/RH(D): A POS

## 2021-11-03 LAB — PREPARE RBC (CROSSMATCH)

## 2021-11-03 LAB — IRON AND TIBC
Iron: 74 ug/dL (ref 28–170)
Saturation Ratios: 36 % — ABNORMAL HIGH (ref 10.4–31.8)
TIBC: 204 ug/dL — ABNORMAL LOW (ref 250–450)
UIBC: 130 ug/dL

## 2021-11-03 LAB — TECHNOLOGIST SMEAR REVIEW

## 2021-11-03 LAB — FERRITIN: Ferritin: 186 ng/mL (ref 11–307)

## 2021-11-03 LAB — T3, FREE: T3, Free: 2.6 pg/mL (ref 2.0–4.4)

## 2021-11-03 LAB — VITAMIN B12: Vitamin B-12: 89 pg/mL — ABNORMAL LOW (ref 180–914)

## 2021-11-03 MED ORDER — SODIUM BICARBONATE 650 MG PO TABS
650.0000 mg | ORAL_TABLET | Freq: Two times a day (BID) | ORAL | Status: DC
  Administered 2021-11-03 – 2021-11-05 (×4): 650 mg via ORAL
  Filled 2021-11-03 (×4): qty 1

## 2021-11-03 MED ORDER — SODIUM CHLORIDE 0.9 % IV SOLN: INTRAVENOUS | Status: DC

## 2021-11-03 MED ORDER — ASPIRIN EC 81 MG PO TBEC
81.0000 mg | DELAYED_RELEASE_TABLET | Freq: Every day | ORAL | Status: DC
  Administered 2021-11-03 – 2021-11-05 (×3): 81 mg via ORAL
  Filled 2021-11-03 (×3): qty 1

## 2021-11-03 MED ORDER — DOXYCYCLINE HYCLATE 100 MG PO TABS
100.0000 mg | ORAL_TABLET | Freq: Two times a day (BID) | ORAL | Status: AC
  Administered 2021-11-03 – 2021-11-04 (×3): 100 mg via ORAL
  Filled 2021-11-03 (×3): qty 1

## 2021-11-03 MED ORDER — CEPHALEXIN 500 MG PO CAPS
500.0000 mg | ORAL_CAPSULE | Freq: Three times a day (TID) | ORAL | Status: AC
  Administered 2021-11-03 – 2021-11-04 (×4): 500 mg via ORAL
  Filled 2021-11-03 (×4): qty 1

## 2021-11-03 MED ORDER — SODIUM CHLORIDE 0.9% IV SOLUTION: Freq: Once | INTRAVENOUS | Status: AC

## 2021-11-03 NOTE — Progress Notes (Signed)
[facility]  ROUNDING NOTE   Subjective:   [name] was admitted to [facility] on 10/31/2021 for Aphasia [R47.01] TIA (transient ischemic attack) [G45.9] Stroke [facility]) [I63.9] NSTEMI (non-ST elevated myocardial infarction) ([place]) [I21.4] Fall, initial encounter [W19.XXXA] Altered mental status, unspecified altered mental status type [R41.82]  Patient was admitted to new onset facial droop and abnormal gait. Found to have acute coronary syndrome on admission. Patient was diagnosed with Takotsubo cardiomyopathy. Also found to have rhabdomyolysis.   Continued on IV fluids: normal saline at 77mL/hr  Patient on antibiotics for urinary tract infection and pneumonia.   Patient started on clopidogrel this admission.   Patient followed for chronic kidney disease stage IV by Dr. Holley Raring. Baseline Creatinine 2.60/GFR 19 from 08/19/21  Asking about going home   Objective:  Vital signs in last 24 hours:  Temp:  [97.7 F (36.5 C)-98.7 F (37.1 C)] 97.8 F (36.6 C) (11/21 0807) Pulse Rate:  [63-86] 73 (11/21 0807) Resp:  [18-19] 18 (11/21 0807) BP: (108-134)/(58-98) 117/75 (11/21 0807) SpO2:  [98 %-100 %] 100 % (11/21 0807)  Weight change:  Filed Weights   10/31/21 0703  Weight: 72.7 kg    Intake/Output: I/O last 3 completed shifts: In: 0623 [P.O.:660; I.V.:2240; IV Piggyback:650] Out: 2925 [Urine:2925]   Intake/Output this shift:  Total I/O In: 240 [P.O.:240] Out: -   Physical Exam: General: NAD, sitting up in bed  Head: Normocephalic, atraumatic. Moist oral mucosal membranes  Neck: Supple,   Lungs:  Clear to auscultation, room air  Heart: Irregular rhythm  Abdomen:  Soft, nontender,   Extremities:  trace peripheral edema.  Neurologic: Nonfocal, moving all four extremities, following commands. Right sided weakness.   Skin: No lesions        Basic Metabolic Panel: Recent Labs  Lab 10/31/21 0648 11/02/21 0451 11/03/21 0504  NA 136 138 137  K 5.1  4.2 3.5  CL 106 112* 115*  CO2 20* 17* 16*  GLUCOSE 118* 102* 105*  BUN 52* 58* 53*  CREATININE 2.94* 2.74* 2.66*  CALCIUM 9.2 8.2* 8.1*     Liver Function Tests: Recent Labs  Lab 10/31/21 0648  AST 59*  ALT 23  ALKPHOS 48  BILITOT 0.5  PROT 8.2*  ALBUMIN 3.9    No results for input(s): LIPASE, AMYLASE in the last 168 hours. No results for input(s): AMMONIA in the last 168 hours.  CBC: Recent Labs  Lab 10/31/21 0648 11/01/21 0606 11/02/21 0451 11/03/21 0504 11/03/21 0758  WBC 14.4* 14.0* 13.3* 11.6* 11.8*  NEUTROABS 12.4*  --   --   --  7.9*  HGB 10.5* 9.8* 8.0* 7.5* 7.7*  HCT 33.0* 29.9* 25.6* 23.2* 23.6*  MCV 99.1 96.8 100.0 98.7 98.3  PLT 291 255 238 226 242     Cardiac Enzymes: Recent Labs  Lab 10/31/21 0648 11/01/21 0606 11/02/21 0451 11/03/21 0504  CKTOTAL 2,335* 3,287* 3,392* 3,079*     BNP: Invalid input(s): POCBNP  CBG: Recent Labs  Lab 10/31/21 0622  GLUCAP 127*     Microbiology: Results for orders placed or performed during the hospital encounter of 10/31/21  Resp Panel by RT-PCR (Flu A&B, Covid) Nasopharyngeal Swab     Status: None   Collection Time: 10/31/21 12:48 AM   Specimen: Nasopharyngeal Swab; Nasopharyngeal(NP) swabs in vial transport medium  Result Value Ref Range Status   SARS Coronavirus 2 by RT PCR NEGATIVE NEGATIVE Final    Comment: (NOTE) SARS-CoV-2 target nucleic acids are NOT DETECTED.  The SARS-CoV-2 RNA is generally detectable in upper respiratory specimens during the acute phase of infection. The lowest concentration of SARS-CoV-2 viral copies this assay can detect is 138 copies/mL. A negative result does not preclude SARS-Cov-2 infection and should not be used as the sole basis for treatment or other patient management decisions. A negative result may occur with  improper specimen collection/handling, submission of specimen other than nasopharyngeal swab, presence of viral mutation(s) within the areas  targeted by this assay, and inadequate number of viral copies(<138 copies/mL). A negative result must be combined with clinical observations, patient history, and epidemiological information. The expected result is Negative.  Fact Sheet for Patients:  EntrepreneurPulse.com.au  Fact Sheet for Healthcare Providers:  IncredibleEmployment.be  This test is no t yet approved or cleared by the [place] FDA and  has been authorized for detection and/or diagnosis of SARS-CoV-2 by FDA under an Emergency Use Authorization (EUA). This EUA will remain  in effect (meaning this test can be used) for the duration of the COVID-19 declaration under Section 564(b)(1) of the Act, 21 U.S.C.section 360bbb-3(b)(1), unless the authorization is terminated  or revoked sooner.       Influenza A by PCR NEGATIVE NEGATIVE Final   Influenza B by PCR NEGATIVE NEGATIVE Final    Comment: (NOTE) The Xpert Xpress SARS-CoV-2/FLU/RSV plus assay is intended as an aid in the diagnosis of influenza from Nasopharyngeal swab specimens and should not be used as a sole basis for treatment. Nasal washings and aspirates are unacceptable for Xpert Xpress SARS-CoV-2/FLU/RSV testing.  Fact Sheet for Patients: EntrepreneurPulse.com.au  Fact Sheet for Healthcare Providers: IncredibleEmployment.be  This test is not yet approved or cleared by the [place] FDA and has been authorized for detection and/or diagnosis of SARS-CoV-2 by FDA under an Emergency Use Authorization (EUA). This EUA will remain in effect (meaning this test can be used) for the duration of the COVID-19 declaration under Section 564(b)(1) of the Act, 21 U.S.C. section 360bbb-3(b)(1), unless the authorization is terminated or revoked.  Performed at [facility], [address] [place], [place] [address]   Culture, blood (x 2)     Status: None (Preliminary result)    Collection Time: 10/31/21 12:49 PM   Specimen: BLOOD  Result Value Ref Range Status   Specimen Description BLOOD  RIGHT [facility]  Final   Special Requests BOTTLES DRAWN AEROBIC AND ANAEROBIC BCLV  Final   Culture   Final    NO GROWTH 3 DAYS Performed at [facility], [address], [place], [place] [address]    Report Status PENDING  Incomplete  Culture, blood (x 2)     Status: None (Preliminary result)   Collection Time: 10/31/21 12:49 PM   Specimen: BLOOD  Result Value Ref Range Status   Specimen Description BLOOD LEFT HAND  Final   Special Requests BOTTLES DRAWN AEROBIC AND ANAEROBIC BCLV  Final   Culture   Final    NO GROWTH 3 DAYS Performed at [facility], [address] [place], [place] [address]    Report Status PENDING  Incomplete  Urine Culture     Status: Abnormal   Collection Time: 10/31/21  3:53 PM   Specimen: Urine, Random  Result Value Ref Range Status   Specimen Description   Final    URINE, RANDOM Performed at [facility], [address] [place], [place] [address]    Special Requests   Final    NONE Performed at [facility], [phone] [place], [place] [address]    Culture >=100,000 COLONIES/mL ESCHERICHIA COLI (A)  Final   Report Status 11/03/2021 FINAL  Final   Organism ID, Bacteria ESCHERICHIA COLI (A)  Final      Susceptibility   Escherichia coli - MIC*    AMPICILLIN 8 SENSITIVE Sensitive     CEFAZOLIN <=4 SENSITIVE Sensitive     CEFEPIME <=0.12 SENSITIVE Sensitive     CEFTRIAXONE <=0.25 SENSITIVE Sensitive     CIPROFLOXACIN <=0.25 SENSITIVE Sensitive     GENTAMICIN <=1 SENSITIVE Sensitive     IMIPENEM <=0.25 SENSITIVE Sensitive     NITROFURANTOIN <=16 SENSITIVE Sensitive     TRIMETH/SULFA <=20 SENSITIVE Sensitive     AMPICILLIN/SULBACTAM 4 SENSITIVE Sensitive     PIP/TAZO <=4 SENSITIVE Sensitive     * >=100,000 COLONIES/mL ESCHERICHIA COLI    Coagulation Studies: No results for input(s): LABPROT,  INR in the last 72 hours.   Urinalysis: No results for input(s): COLORURINE, LABSPEC, PHURINE, GLUCOSEU, HGBUR, BILIRUBINUR, KETONESUR, PROTEINUR, UROBILINOGEN, NITRITE, LEUKOCYTESUR in the last 72 hours.  Invalid input(s): APPERANCEUR     Imaging: DG HIP UNILAT WITH PELVIS 2-3 VIEWS RIGHT  Result Date: 11/02/2021 CLINICAL DATA:  Right hip pain of unknown origin.  Dementia. EXAM: DG HIP (WITH OR WITHOUT PELVIS) 2-3V RIGHT COMPARISON:  None. FINDINGS: Exam demonstrates diffuse decreased bone mineralization. There mild symmetric degenerative changes of the hips. No evidence of acute fracture or dislocation. No focal bony abnormality over the right hip. Degenerative changes of the spine and sacroiliac joints. IMPRESSION: 1. No acute findings. 2. Mild symmetric degenerative change of the hips. Electronically Signed   By: Marin Olp M.D.   On: 11/02/2021 08:45     Medications:    sodium chloride 75 mL/hr at 11/03/21 0820   azithromycin Stopped (11/02/21 1239)   cefTRIAXone (ROCEPHIN)  IV Stopped (11/02/21 1035)   metronidazole 500 mg (11/03/21 0432)     stroke: mapping our early stages of recovery book   Does not apply Once   aspirin EC  81 mg Oral Daily   citalopram  20 mg Oral Daily   metoprolol succinate  12.5 mg Oral Daily   mirabegron ER  50 mg Oral Daily   acetaminophen **OR** acetaminophen (TYLENOL) oral liquid 160 mg/5 mL **OR** acetaminophen, albuterol, dextromethorphan-guaiFENesin, hydrALAZINE, HYDROcodone-acetaminophen, ondansetron (ZOFRAN) IV, senna-docusate  Assessment/ Plan:  [name] is a 75 y.o. white female with CVA with residual receptive aphasia,  cerebral aneurysm, hypertension, hyperlipidemia, depression, over active bladder, and pituitary mass who is admitted to [facility] on 10/31/2021 for Aphasia [R47.01] TIA (transient ischemic attack) [G45.9] Stroke [facility]) [I63.9] NSTEMI (non-ST elevated myocardial infarction) ([place]) [I21.4] Fall, initial encounter  [W19.XXXA] Altered mental status, unspecified altered mental status type [R41.82]  Acute kidney injury on chronic kidney disease stage IV with proteinuria: baseline creatinine of 2.6, GFR of 19 on 08/19/2021. Chronic kidney disease secondary to hypertension and arthrosclerotic disease. Acute kidney injury secondary to rhabdomyolysis.  - holding losartan - Continue IV fluids - started oral bicarb for acidosis   UTI  E Coli in urine 11/18. Tx with ceftriaxone iv  Anemia of chronic kidney disease:  -  hgb 7.7 range 7.5-9.8  Cardiomyopathy: diagnosed with Takotsubo cardiomyopathy. Echocardiogram with EF of 30-35% and grade I diastolic congestive heart failure.  - Appreciate cardiology input.  - monitor volume status closely with IV fluids running.      Rhabdomyolysis: CK trending lower - holding atorvastatin - Continue IV fluids as  above.   Pneumonia: possibly secondary to aspiration. Empiric antibiotics of ceftriaxone, metronidazole, and azithromycin.  - Continue supportive care and oxygen. Breathing room air this morning.       LOS: 3 Verenice Westrich 11/21/202210:24 AM

## 2021-11-03 NOTE — Assessment & Plan Note (Signed)
Oxygenation worsening to 84% on room air in ER.  Placed on 4 L of oxygen.  Currently resolving.  On room air.

## 2021-11-03 NOTE — Assessment & Plan Note (Signed)
Continue Celexa

## 2021-11-03 NOTE — Assessment & Plan Note (Addendum)
Appears to be euvolemic. Echo shows EF of 30 to 35%.  Continue metoprolol, aspirin.  Treated with heparin infusion for 48 hours.   Unable to restart losartan due to renal dysfunction.  Cannot give Lipitor due to rhabdo.

## 2021-11-03 NOTE — Assessment & Plan Note (Addendum)
Present on admission.  Hemoglobin 10.5 at the time of admission.  Progressively worsening to hemoglobin of 7.5 right now.  We transfuse 1 PRBC in the setting of elevated troponin and concern for Takotsubo.   H&H remained stable. FOBT was positive, hemorrhoids?.  This appears to be multifactorial.

## 2021-11-03 NOTE — Assessment & Plan Note (Signed)
Symptoms resolved.  Continue aspirin and statin.  Added Plavix. Which is currently on hold due to concern with anemia. Stroke work-up essentially negative including MRI of the brain, carotid Dopplers and echo.

## 2021-11-03 NOTE — Consult Note (Signed)
[name] Cardiology  CARDIOLOGY CONSULT NOTE  Patient ID: [name] MRN: [id number] DOB/AGE: January 29, 1946 75 y.o.  Admit date: 10/31/2021 Referring Physician Ivor Costa Primary Physician Feldpausch, Chrissie Noa, MD Primary Cardiologist None Reason for Consultation NSTEMI  HPI:  Linda [name] is a 75 year old female with history of CVA with residual aphasia, hypertension, hyperlipidemia, dementia, CKD 4, chronic diastolic heart failure who was admitted with worse facial droop and difficulty walking.  Cardiology is consulted because the troponin is elevated to 7000 and echocardiogram shows a hypokinetic anterior wall and apex.  Troponin subsequently decreased.  The patient has no chest pain.  Interval history:  - Hgb continues to decrease, 7.5 this morning.  - Denies shortness of breath or chest pain.   Review of systems complete and found to be negative unless listed above     Past Medical History:  Diagnosis Date   Anxiety    Benign neoplasm of colon 03/27/2014   Brain aneurysm 2006   Cerebrovascular disease 06/28/2014   Chronic kidney disease    Dementia, vascular ([place]) 06/19/2014   Depression    Hyperlipidemia 02/20/2019   Pure hypercholesterolemia 03/27/2014   Receptive aphasia    Sleep apnea    Stroke [facility])    Vascular dementia ([place])    Wears dentures     Past Surgical History:  Procedure Laterality Date   CATARACT EXTRACTION W/PHACO Left 04/27/2018   Procedure: CATARACT EXTRACTION PHACO AND INTRAOCULAR LENS PLACEMENT ([place]) LEFT;  Surgeon: Leandrew Koyanagi, MD;  Location: [place];  Service: Ophthalmology;  Laterality: Left;   CATARACT EXTRACTION W/PHACO Right 06/14/2018   Procedure: CATARACT EXTRACTION PHACO AND INTRAOCULAR LENS PLACEMENT ([place])  RIGHT;  Surgeon: Leandrew Koyanagi, MD;  Location: [place];  Service: Ophthalmology;  Laterality: Right;   CEREBRAL ANEURYSM REPAIR      Medications Prior to Admission  Medication Sig Dispense Refill Last Dose    amLODipine (NORVASC) 2.5 MG tablet Take 2.5 mg by mouth daily.   10/30/2021 at 0800   atorvastatin (LIPITOR) 40 MG tablet Take 1 tablet (40 mg total) by mouth daily. 30 tablet 2 10/30/2021 at 0800   citalopram (CELEXA) 20 MG tablet Take 20 mg by mouth daily.   10/30/2021 at 0800   clopidogrel (PLAVIX) 75 MG tablet Take 1 tablet (75 mg total) by mouth daily. 30 tablet 2 10/30/2021 at 0800   losartan (COZAAR) 100 MG tablet Take 100 mg by mouth daily.   10/30/2021 at 0800   MYRBETRIQ 50 MG TB24 tablet Take 1 tablet by mouth once daily 30 tablet 11 10/30/2021 at 0800   Vitamin D, Ergocalciferol, (DRISDOL) 50000 units CAPS capsule Take 50,000 Units by mouth once a week.    Past Week at Unknown    Social History   Socioeconomic History   Marital status: Married    Spouse name: Not on file   Number of children: Not on file   Years of education: Not on file   Highest education level: Not on file  Occupational History   Not on file  Tobacco Use   Smoking status: Former    Packs/day: 1.00    Types: Cigarettes   Smokeless tobacco: Never  Vaping Use   Vaping Use: Never used  Substance and Sexual Activity   Alcohol use: Not Currently   Drug use: Never   Sexual activity: Not on file  Other Topics Concern   Not on file  Social History Narrative   Not on file   Social Determinants of  Health   Financial Resource Strain: Not on file  Food Insecurity: Not on file  Transportation Needs: Not on file  Physical Activity: Not on file  Stress: Not on file  Social Connections: Not on file  Intimate Partner Violence: Not on file    Family History  Problem Relation Age of Onset   Dementia Mother    Heart attack Brother       Review of systems complete and found to be negative unless listed above      PHYSICAL EXAM  General: Well developed, well nourished, in no acute distress HEENT:  Normocephalic and atramatic Neck:  No JVD.  Lungs: Clear bilaterally to auscultation and  percussion. Heart: HRRR . Normal S1 and S2 without gallops or murmurs.  Abdomen: Bowel sounds are positive, abdomen soft and non-tender  Msk:  Back normal, normal gait. Normal strength and tone for age. Extremities: No clubbing, cyanosis or edema.   Neuro: Alert and oriented X 3. Psych:  Good affect, responds appropriately  Labs:   Lab Results  Component Value Date   WBC 11.6 (H) 11/03/2021   HGB 7.5 (L) 11/03/2021   HCT 23.2 (L) 11/03/2021   MCV 98.7 11/03/2021   PLT 226 11/03/2021    Recent Labs  Lab 10/31/21 0648 11/02/21 0451 11/03/21 0504  NA 136   < > 137  K 5.1   < > 3.5  CL 106   < > 115*  CO2 20*   < > 16*  BUN 52*   < > 53*  CREATININE 2.94*   < > 2.66*  CALCIUM 9.2   < > 8.1*  PROT 8.2*  --   --   BILITOT 0.5  --   --   ALKPHOS 48  --   --   ALT 23  --   --   AST 59*  --   --   GLUCOSE 118*   < > 105*   < > = values in this interval not displayed.    Lab Results  Component Value Date   CKTOTAL 3,079 (H) 11/03/2021   TROPONINI < 0.02 03/17/2012     Lab Results  Component Value Date   CHOL 120 11/01/2021   CHOL 138 06/28/2020   CHOL 136 06/15/2020   Lab Results  Component Value Date   HDL 43 11/01/2021   HDL 53 06/28/2020   HDL 40 (L) 06/15/2020   Lab Results  Component Value Date   LDLCALC 63 11/01/2021   LDLCALC 73 06/28/2020   LDLCALC 76 06/15/2020   Lab Results  Component Value Date   TRIG 71 11/01/2021   TRIG 58 06/28/2020   TRIG 99 06/15/2020   Lab Results  Component Value Date   CHOLHDL 2.8 11/01/2021   CHOLHDL 2.6 06/28/2020   CHOLHDL 3.4 06/15/2020   No results found for: LDLDIRECT    Radiology: MR ANGIO HEAD WO CONTRAST  Result Date: 10/31/2021 CLINICAL DATA:  Neuro deficit, acute, stroke suspected EXAM: MRI HEAD WITHOUT CONTRAST MRA HEAD WITHOUT CONTRAST TECHNIQUE: Multiplanar, multi-echo pulse sequences of the brain and surrounding structures were acquired without intravenous contrast. Angiographic images of the  Circle of Willis were acquired using MRA technique without intravenous contrast. COMPARISON:  10/15/2021 FINDINGS: MRI HEAD FINDINGS Brain: No acute infarction or hemorrhage. Encephalomalacia and gliosis in the left cerebral hemisphere with involvement of frontal, parietal, temporal, and occipital lobes. Associated chronic blood products particularly in the temporal lobe. Ex vacuo dilatation of the left lateral ventricle. Chronic  small vessel infarcts of the central gray nuclei, central white matter, pons, and cerebellum. Few additional scattered foci of susceptibility likely reflecting chronic microhemorrhages. Other stable confluent areas of T2 hyperintensity in the supratentorial and pontine white matter are nonspecific but probably reflect advanced chronic microvascular ischemic changes. Prominence of the ventricles and sulci reflects parenchymal volume loss. Vascular: Susceptibility artifact related to left MCA aneurysm coiling. Skull and upper cervical spine: Marrow signal is within normal limits. Sinuses/Orbits: Chronic left sphenoid sinusitis. Bilateral lens replacements. Other: Suprasellar mass along pituitary infundibulum is not well evaluated on this study. Persistent bilateral mastoid effusions. MRA HEAD FINDINGS Anterior circulation: Intracranial internal carotid arteries are patent with atherosclerotic irregularity. Anterior cerebral arteries are patent. Middle cerebral arteries are patent. There is susceptibility artifact at site of left MCA bifurcation aneurysm coiling. No evidence of recurrence. Posterior circulation: Intracranial vertebral arteries are patent. The right vertebral artery terminates as a PICA. Basilar artery is patent. Posterior cerebral arteries are patent. Bilateral posterior communicating arteries are present. Fetal origin of the left PCA. IMPRESSION: No acute infarction or other acute abnormality. Stable chronic findings compared to recent prior study. No significant vascular  abnormality. 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 Electronically Signed   By: Macy Mis M.D.   On: 10/31/2021 12:42   MR BRAIN WO CONTRAST  Result Date: 10/31/2021 CLINICAL DATA:  Neuro deficit, acute, stroke suspected EXAM: MRI HEAD WITHOUT CONTRAST MRA HEAD WITHOUT CONTRAST TECHNIQUE: Multiplanar, multi-echo pulse sequences of the brain and surrounding structures were acquired without intravenous contrast. Angiographic images of the Circle of Willis were acquired using MRA technique without intravenous contrast. COMPARISON:  10/15/2021 FINDINGS: MRI HEAD FINDINGS Brain: No acute infarction or hemorrhage. Encephalomalacia and gliosis in the left cerebral hemisphere with involvement of frontal, parietal, temporal, and occipital lobes. Associated chronic blood products particularly in the temporal lobe. Ex vacuo dilatation of the left lateral ventricle. Chronic small vessel infarcts of the central gray nuclei, central white matter, pons, and cerebellum. Few additional scattered foci of susceptibility likely reflecting chronic microhemorrhages. Other stable confluent areas of T2 hyperintensity in the supratentorial and pontine white matter are nonspecific but probably reflect advanced chronic microvascular ischemic changes. Prominence of the ventricles and sulci reflects parenchymal volume loss. Vascular: Susceptibility artifact related to left MCA aneurysm coiling. Skull and upper cervical spine: Marrow signal is within normal limits. Sinuses/Orbits: Chronic left sphenoid sinusitis. Bilateral lens replacements. Other: Suprasellar mass along pituitary infundibulum is not well evaluated on this study. Persistent bilateral mastoid effusions. MRA HEAD FINDINGS Anterior circulation: Intracranial internal carotid arteries are patent with atherosclerotic irregularity. Anterior cerebral arteries are patent. Middle cerebral arteries are patent. There is susceptibility  artifact at site of left MCA bifurcation aneurysm coiling. No evidence of recurrence. Posterior circulation: Intracranial vertebral arteries are patent. The right vertebral artery terminates as a PICA. Basilar artery is patent. Posterior cerebral arteries are patent. Bilateral posterior communicating arteries are present. Fetal origin of the left PCA. IMPRESSION: No acute infarction or other acute abnormality. Stable chronic findings compared to recent prior study. No significant vascular abnormality. 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 Electronically Signed   By: Macy Mis M.D.   On: 10/31/2021 12:42   MR BRAIN W WO CONTRAST  Result Date: 10/15/2021 CLINICAL DATA:  Follow-up suprasellar mass. EXAM: MRI HEAD WITHOUT AND WITH CONTRAST TECHNIQUE: Multiplanar, multiecho pulse sequences of the brain and surrounding structures were obtained without and with intravenous contrast.  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 This is in the midline and is in the area of the infundibulum. This is just above the pituitary but appears separate from the pituitary which otherwise appears normal. Generalized atrophy with ventricular enlargement which is similar to the prior study. There is encephalomalacia and chronic infarction in the left frontal and temporal lobe. Chronic blood products are present in the left temporal infarct. Prior aneurysm coiling in the left MCA. Advanced chronic microvascular ischemic changes in the white matter. Negative for acute infarct. Vascular: Normal arterial flow voids. Aneurysm coiling left MCA bifurcation region. Skull and upper cervical spine: No focal lesion. Sinuses/Orbits: Mucosal edema in the paranasal sinuses. Bilateral mastoid effusion. 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 This is just above the pituitary and appears separate from the pituitary. Extensive atrophy. Extensive chronic microvascular ischemic change. Chronic left MCA infarct. Left MCA bifurcation aneurysm coiling. Electronically Signed   By: Franchot Gallo M.D.   On: 10/15/2021 16:05   US Carotid Bilateral (at [facility] and AP only)  Result Date: 10/31/2021 CLINICAL DATA:  Stroke, hyperlipidemia, former smoker EXAM: BILATERAL CAROTID DUPLEX ULTRASOUND TECHNIQUE: Pearline Cables scale imaging, color Doppler and duplex ultrasound were performed of bilateral carotid and vertebral arteries in the neck. COMPARISON:  None. FINDINGS: Criteria: Quantification of carotid stenosis is based on velocity parameters that correlate the residual internal carotid diameter with NASCET-based stenosis levels, using the diameter of the distal internal carotid lumen as the denominator for stenosis measurement. The following velocity measurements were obtained: RIGHT ICA: 71/27 cm/sec CCA: 30/86 cm/sec SYSTOLIC ICA/CCA RATIO:  1.0 ECA: 47 cm/sec LEFT ICA: 127/36 cm/sec CCA: 57/84 cm/sec SYSTOLIC ICA/CCA RATIO:  2.8 ECA: 46 cm/sec RIGHT CAROTID ARTERY: Moderate mixed echogenicity carotid bifurcation atherosclerosis. Despite this, no significant ICA stenosis, velocity elevation, turbulent flow. Degree of narrowing less than 50% by ultrasound criteria. RIGHT VERTEBRAL ARTERY:  Normal antegrade flow LEFT CAROTID ARTERY: Similar moderate mixed echogenicity irregular bifurcation atherosclerosis. Negative for significant stenosis, velocity elevation, turbulent flow. Degree of narrowing also less than 50% by ultrasound criteria. LEFT VERTEBRAL ARTERY:  Normal antegrade flow IMPRESSION: Bilateral carotid atherosclerosis. Negative for significant ICA stenosis. Degree of narrowing less than 50% bilaterally by ultrasound criteria. Patent antegrade vertebral flow bilaterally Electronically  Signed   By: Jerilynn Mages.  Shick M.D.   On: 10/31/2021 14:25   DG Chest Portable 1 View  Result Date: 10/31/2021 CLINICAL DATA:  75 year old female code stroke presentation. Cough for 1 week. EXAM: PORTABLE CHEST 1 VIEW COMPARISON:  Chest radiographs 03/17/2012. FINDINGS: Portable AP upright view at 0652 hours. Chronic large lung volumes. Mild cardiomegaly appears new since 2013. Other mediastinal contours are within normal limits. Visualized tracheal air column is within normal limits. Chronic but increased and coarse bilateral pulmonary interstitial opacity, fairly symmetric and with some basilar predominance. No pneumothorax, pleural effusion or confluent opacity. No acute osseous abnormality identified. Negative visible bowel gas. IMPRESSION: Chronic hyperinflation with acute on chronic bilateral pulmonary interstitial opacity. Top differential considerations include viral/atypical respiratory infection, pulmonary edema, progressed chronic lung disease. Mild cardiomegaly.  No pleural effusion. Electronically Signed   By: Genevie Ann M.D.   On: 10/31/2021 07:14   ECHOCARDIOGRAM COMPLETE  Result Date: 10/31/2021    ECHOCARDIOGRAM REPORT   Patient Name:   Linda [name] Date of Exam: 10/31/2021 Medical  Rec #:  [id number]      Height:       66.0 in Accession #:    [id number]     Weight:       160.3 lb Date of Birth:  04-Oct-1946      BSA:          1.820 m Patient Age:    32 years       BP:           145/87 mmHg Patient Gender: F              HR:           85 bpm. Exam Location:  [facility] Procedure: 2D Echo, Cardiac Doppler and Color Doppler Indications:     Stroke I63.9  History:         Patient has no prior history of Echocardiogram examinations.                  Stroke; Risk Factors:Dyslipidemia. CKD.  Sonographer:     Sherrie Sport Referring Phys:  Unknown Foley NIU Diagnosing Phys: Donnelly Angelica  Sonographer Comments: Suboptimal apical window. IMPRESSIONS  1. Left ventricular ejection fraction, by estimation, is 30 to 35%. The  left ventricle has moderately decreased function. The left ventricle demonstrates regional wall motion abnormalities (see scoring diagram/findings for description). Left ventricular  diastolic parameters are consistent with Grade I diastolic dysfunction (impaired relaxation).  2. Right ventricular systolic function is normal. The right ventricular size is normal.  3. The mitral valve is normal in structure. Mild mitral valve regurgitation. No evidence of mitral stenosis.  4. The aortic valve was not well visualized. Aortic valve regurgitation is not visualized. No aortic stenosis is present. 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 FINDINGS  Left Ventricle: Left ventricular ejection fraction, by estimation, is 30 to 35%. The left ventricle has moderately decreased function. The left ventricle demonstrates regional wall motion abnormalities. The left ventricular internal cavity size was normal in size. There is no left ventricular hypertrophy. Left ventricular diastolic parameters are consistent with Grade I diastolic dysfunction (impaired relaxation).  LV Wall Scoring: The entire septum is akinetic. The basal anterior segment is hypokinetic. Right Ventricle: The right ventricular size is normal. No increase in right ventricular wall thickness. Right ventricular systolic function is normal. Left Atrium: Left atrial size was normal in size. Right Atrium: Right atrial size was normal in size. Pericardium: There is no evidence of pericardial effusion. Mitral Valve: The mitral valve is normal in structure. Mild mitral valve regurgitation. No evidence of mitral valve stenosis. MV peak gradient, 5.9 mmHg. The mean mitral valve gradient is 2.0 mmHg. Tricuspid Valve: The tricuspid valve is normal in structure. Tricuspid valve regurgitation is mild. Aortic Valve: The aortic valve was not well visualized. Aortic valve regurgitation is not visualized. No aortic  stenosis is present. Aortic valve mean gradient measures 1.5 mmHg. Aortic valve peak gradient measures 3.3 mmHg. Aortic valve area, by VTI measures 4.42 cm. Pulmonic Valve: The pulmonic valve was not well visualized. Pulmonic valve regurgitation is not visualized. Aorta: The aortic root was not well visualized. IAS/Shunts: The interatrial septum was not assessed.  LEFT VENTRICLE PLAX 2D LVIDd:         4.00 cm     Diastology LVIDs:         3.40 cm     LV e' medial:    3.92 cm/s LV PW:         1.20  cm     LV E/e' medial:  12.6 LV IVS:        0.80 cm     LV e' lateral:   5.00 cm/s LVOT diam:     2.10 cm     LV E/e' lateral: 9.9 LV SV:         63 LV SV Index:   35 LVOT Area:     3.46 cm  LV Volumes (MOD) LV vol d, MOD A2C: 84.5 ml LV vol d, MOD A4C: 64.1 ml LV vol s, MOD A2C: 71.1 ml LV vol s, MOD A4C: 34.9 ml LV SV MOD A2C:     13.4 ml LV SV MOD A4C:     64.1 ml LV SV MOD BP:      24.6 ml RIGHT VENTRICLE RV Basal diam:  3.50 cm RV S prime:     10.30 cm/s TAPSE (M-mode): 3.4 cm LEFT ATRIUM             Index        RIGHT ATRIUM           Index LA diam:        3.00 cm 1.65 cm/m   RA Area:     10.40 cm LA Vol (A2C):   48.2 ml 26.48 ml/m  RA Volume:   21.10 ml  11.59 ml/m LA Vol (A4C):   67.4 ml 37.03 ml/m LA Biplane Vol: 62.3 ml 34.23 ml/m  AORTIC VALVE                    PULMONIC VALVE AV Area (Vmax):    3.78 cm     PV Vmax:        0.59 m/s AV Area (Vmean):   3.81 cm     PV Vmean:       38.300 cm/s AV Area (VTI):     4.42 cm     PV VTI:         0.083 m AV Vmax:           90.35 cm/s   PV Peak grad:   1.4 mmHg AV Vmean:          59.950 cm/s  PV Mean grad:   1.0 mmHg AV VTI:            0.144 m      RVOT Peak grad: 2 mmHg AV Peak Grad:      3.3 mmHg AV Mean Grad:      1.5 mmHg LVOT Vmax:         98.60 cm/s LVOT Vmean:        66.000 cm/s LVOT VTI:          0.183 m LVOT/AV VTI ratio: 1.28  AORTA Ao Root diam: 2.70 cm MITRAL VALVE               TRICUSPID VALVE MV Area (PHT): 5.34 cm    TR Peak grad:   43.8 mmHg MV  Area VTI:   3.88 cm    TR Vmax:        331.00 cm/s MV Peak grad:  5.9 mmHg MV Mean grad:  2.0 mmHg    SHUNTS MV Vmax:       1.21 m/s    Systemic VTI:  0.18 m MV Vmean:      70.4 cm/s   Systemic Diam: 2.10 cm MV Decel Time: 142 msec    Pulmonic VTI:  0.116 m MV E velocity: 49.30  cm/s MV A velocity: 84.80 cm/s MV E/A ratio:  0.58 Donnelly Angelica Electronically signed by Donnelly Angelica Signature Date/Time: 10/31/2021/3:14:43 PM    Final    DG HIP UNILAT WITH PELVIS 2-3 VIEWS RIGHT  Result Date: 11/02/2021 CLINICAL DATA:  Right hip pain of unknown origin.  Dementia. EXAM: DG HIP (WITH OR WITHOUT PELVIS) 2-3V RIGHT COMPARISON:  None. FINDINGS: Exam demonstrates diffuse decreased bone mineralization. There mild symmetric degenerative changes of the hips. No evidence of acute fracture or dislocation. No focal bony abnormality over the right hip. Degenerative changes of the spine and sacroiliac joints. IMPRESSION: 1. No acute findings. 2. Mild symmetric degenerative change of the hips. Electronically Signed   By: Marin Olp M.D.   On: 11/02/2021 08:45   CT HEAD CODE STROKE WO CONTRAST  Result Date: 10/31/2021 CLINICAL DATA:  Code stroke.  75 year old female with aphasia. EXAM: CT HEAD WITHOUT CONTRAST TECHNIQUE: Contiguous axial images were obtained from the base of the skull through the vertex without intravenous contrast. COMPARISON:  Recent brain MRI 10/15/2021.  Head CT 06/27/2020. 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 Fairly extensive chronic encephalomalacia in the left hemisphere involving both the MCA and PCA territories. Superimposed advanced bilateral cerebral white matter hypodensity. Small area of chronic encephalomalacia in the anterior right MCA territory. Advanced small vessel disease throughout the bilateral deep gray nuclei. And chronic small vessel disease also in the brainstem and bilateral cerebellar hemispheres. Ex vacuo  ventricular enlargement is stable from earlier this month. No superimposed midline shift, mass effect, intracranial hemorrhage or evidence of cortically based acute infarction. 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 Vascular: Calcified atherosclerosis at the skull base. 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 No suspicious intracranial vascular hyperdensity. Skull: No acute osseous abnormality identified. Sinuses/Orbits: Chronic sinusitis with scattered mucoperiosteal thickening has not significantly changed from last year. Mild right mastoid effusion not significantly changed. Other: No acute orbit or scalp soft tissue finding. ASPECTS [facility] Stroke Program Early CT Score) Total score (0-10 with 10 being normal): 10 (chronic encephalomalacia) IMPRESSION: 1. Severe chronic ischemic disease. No acute cortically based infarct or acute intracranial hemorrhage identified. 2. Previous left MCA aneurysm coil embolization. Study discussed by telephone with Dr. Pryor Curia on 10/31/2021 at 06:36 . Electronically Signed   By: Genevie Ann M.D.   On: 10/31/2021 06:37    EKG: Sinus rhythm with nonspecific ST and T wave abnormalities; there is 1 mm of ST elevation in lead V4 but isolated.  Echo 10/31/21- 1. Left ventricular ejection fraction, by estimation, is 30 to 35%. The  left ventricle has moderately decreased function. The left ventricle  demonstrates regional wall motion abnormalities (see scoring  diagram/findings for description). Left ventricular   diastolic parameters are consistent with Grade I diastolic dysfunction  (impaired relaxation).   2. Right ventricular systolic function is normal. The right ventricular  size is normal.   3. The mitral valve is normal in structure. Mild mitral valve  regurgitation. No evidence of mitral stenosis.   4. The aortic valve was not well visualized. Aortic valve regurgitation  is not visualized. No aortic stenosis is present.    ASSESSMENT AND PLAN:  Linda [name] is a 75 year old female with history of CVA with residual aphasia, hypertension, hyperlipidemia, dementia, CKD 4, chronic diastolic heart failure who was admitted with worse facial droop and difficulty walking.  Cardiology is consulted because the troponin is elevated to 7000 and echocardiogram  shows a hypokinetic anterior wall and apex.  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  As part of her stroke evaluation echocardiogram was completed that showed an akinetic anteroseptal wall and apex and a troponin that was elevated to 6500 and decreasing therafter.  Per report she did not have any chest pain prior to this, and she very clearly denies chest pain at this time.  Most likely this is Takotsubo in the setting of her stroke/TIA, but but it is also reasonable to consider missed MI in the last several days.  Given her renal insufficiency and neurologic findings we will manage conservatively.  We will consider an ischemic evaluation depending on how her renal function improves and trend in Hgb; likely outpatient. -Continue aspirin indefinitely - Plavix for 12 months; Hgb trending down to 7.5 today. If concern for bleeding ok to hold this; may be dilutional from IVF. -s/p heparin x 48 hrs -Continue Lipitor 40 mg daily -Metoprolol XL 12.5 mg daily  - No ACE/ARB d/t renal function -She will need an ischemic evaluation, but renal function and and anemia are major barriers. Additionally patient is adamant to go home. Will plan for close outpatient follow up and determination for ischemic evaluation at that time.   Signed: Andrez Grime MD 11/03/2021, 7:01 AM

## 2021-11-03 NOTE — Assessment & Plan Note (Signed)
Continue metoprolol, as needed hydralazine.

## 2021-11-03 NOTE — Assessment & Plan Note (Addendum)
Baseline creatinine around 1.6-1.9.  Admission creatinine of 2.94> 2.74  Was given IV hydration. nephrology consulted and seen

## 2021-11-03 NOTE — Assessment & Plan Note (Signed)
Lipitor on hold due to elevated CK/rhabdo.

## 2021-11-03 NOTE — Progress Notes (Signed)
OT Cancellation Note  Patient Details Name: Linda [name] MRN: [id number] DOB: 1946-08-15   Cancelled Treatment:    Reason Eval/Treat Not Completed: Medical issues which prohibited therapy. Chart reviewed, pt noted be receiving blood at this time with request to hold therapies. Will follow up at later date/time as able.  Dessie Coma, M.S. OTR/L  11/03/21, 3:29 PM  ascom [phone]

## 2021-11-03 NOTE — Progress Notes (Signed)
PT Cancellation Note  Patient Details Name: Linda [name] MRN: [id number] DOB: 10/27/1946   Cancelled Treatment:    Reason Eval/Treat Not Completed: Other (comment) Attempted to see pt for PT tx but nurse requests to hold PT at this time as she just started giving pt a blood transfusion. Will f/u as able.  Lavone Nian, PT, DPT 11/03/21, 2:17 PM    [name] 11/03/2021, 2:16 PM

## 2021-11-03 NOTE — Assessment & Plan Note (Addendum)
Treated with Rocephin, Flagyl and Zithromax. We will switch to doxycycline and Keflex.  Completed antibiotic treatment course in the hospital.

## 2021-11-03 NOTE — Progress Notes (Signed)
[facility] Progress Note  Patient: Linda [name]    QTM:[id number]  DOA: 10/31/2021    Date of Service: the patient was seen and examined on 11/03/2021  Brief hospital course: 75 y.o. female with medical history significant of stroke with aphasia, hypertension, hyperlipidemia, depression with anxiety, dementia, CKD-IV, sCHF, brain aneurysm, former smoker, who presents with worsening facial and difficulty walking.   11/19: Negative stroke work-up and her speech is back to baseline per family.  Her right side weakness is resolved. Cardiology recommends 48 hours of heparin infusion with uncertainty of possible MI versus Takotsubo cardiomyopathy.  Worsening CK.  Nephrology consultation 11/20: CK still trending up.  Nephrology seen, cardiology not planning for cath considering renal dysfunction 11/21: 1 PRBC ordered.  Assessment and Plan: * Takotsubo cardiomyopathy Elevated troponin.  Echo showing akinetic anteroseptal wall and apex.  Troponin peaked at 6500 and now trending down.  Patient is not having any chest pain.  Cardiology recommends conservative management with heparin infusion 48 hours.  Continue low-dose metoprolol.  Due to her renal dysfunction ischemic evaluation will be deferred while here in the hospital per cardiology.  Cannot give statin due to elevated CK/rhabdo.  Continue IV hydration for now  Acute respiratory failure with hypoxia (HCC) Oxygenation worsening to 84% on room air in ER.  Placed on 4 L of oxygen.  Currently resolving.  On room air.  Atypical pneumonia Treated with Rocephin, Flagyl and Zithromax. We will switch to doxycycline and Keflex.  E. coli UTI (urinary tract infection)-resolved as of 11/03/2021 On Rocephin.  Cultures growing pansensitive E. coli. Will transition to Keflex.  Elevated troponin Cardiology consulted.  Manage conservatively.  Was given IV heparin and on aspirin and Plavix.  Plavix and heparin currently on hold due to concern with  anemia.  TIA (transient ischemic attack) Symptoms resolved.  Continue aspirin and statin.  Added Plavix. Which is currently on hold due to concern with anemia. Stroke work-up essentially negative including MRI of the brain, carotid Dopplers and echo.  Chronic systolic CHF (congestive heart failure) (HCC) Appears to be euvolemic. Echo shows EF of 30 to 35%.  Continue metoprolol, aspirin.  Treated with heparin infusion for 48 hours.  Currently on hold secondary to anemia. Unable to restart losartan due to renal dysfunction.  Cannot give Lipitor due to rhabdo.   CKD (chronic kidney disease), stage IV (HCC) Baseline creatinine around 1.6-1.9.  Admission creatinine of 2.94> 2.74  Continue IV hydration. nephrology consulted and seen  Sepsis ([place]) Met SIRS criteria on admission with organ damage with AKI and hypoxia. Due to atypical pneumonia and or possible aspiration.  Normal lactic acid.  Continue IV hydration Sepsis physiology resolving.  Rhabdomyolysis CK of 3287> 3392.  Monitor.  Hold statin.  Continue IV fluids and monitor CK  Other specified anemias Present on admission.  Hemoglobin 10.5 at the time of admission.  Progressively worsening to hemoglobin of 7.5 right now.  We will transfuse 1 PRBC in the setting of elevated troponin and concern for Takotsubo.  Monitor postprocedure CBC.  Depression with anxiety Continue Celexa.  Essential hypertension Continue metoprolol, as needed hydralazine.  Hyperlipidemia Lipitor on hold due to elevated CK/rhabdo.    Body mass index is 25.87 kg/m.        Subjective: Denies any acute complaint.  Wants to go home.  No nausea vomiting or chest pain.  No shortness of breath.  No dizziness or lightheadedness.  Objective: Vital signs shows soft blood pressure.  General: Appear in mild  distress, no Rash; Oral Mucosa Clear, moist. no Abnormal Neck Mass Or lumps, Conjunctiva normal  Cardiovascular: S1 and S2 Present, no Murmur, Respiratory:  good respiratory effort, Bilateral Air entry present and CTA, no Crackles, no wheezes Abdomen: Bowel Sound present, Soft and no tenderness Extremities: no Pedal edema Neurology: alert and oriented to time, place, and person affect appropriate. no new focal deficit Gait not checked due to patient safety concerns   Data Reviewed: My review of labs, imaging, notes and other tests is significant for     anemia.  Disposition:  Status is: Inpatient  Remains inpatient appropriate because: Needs blood transfusion.  Monitor CBC for stability.   Family Communication: No family at bedside.  DVT Prophylaxis: Place and maintain sequential compression device Start: 11/03/21 1500   Time spent: 35 minutes.   Author: Berle Mull  11/03/2021 7:34 PM  To reach On-call, see care teams to locate the attending and reach out via [email or web address]. Between 7PM-7AM, please contact night-coverage If you still have difficulty reaching the attending provider, please page the [facility] (Director on Call) for [facility] on amion for assistance.

## 2021-11-03 NOTE — Assessment & Plan Note (Addendum)
Elevated troponin.  Echo showing akinetic anteroseptal wall and apex.  Troponin peaked at 6500 and now trending down.  Patient is not having any chest pain.  Cardiology recommends conservative management with heparin infusion 48 hours.  Continue low-dose metoprolol.  Due to her renal dysfunction ischemic evaluation will be deferred while here in the hospital per cardiology.  Cannot give statin due to elevated CK/rhabdo.

## 2021-11-03 NOTE — Assessment & Plan Note (Addendum)
Cardiology consulted.  Manage conservatively.  Was given IV heparin and on aspirin and Plavix.  Plavix will be resumed.

## 2021-11-03 NOTE — Assessment & Plan Note (Addendum)
Met SIRS criteria on admission with organ damage with AKI and hypoxia. Due to atypical pneumonia and or possible aspiration.  Normal lactic acid.Sepsis physiology resolving.

## 2021-11-03 NOTE — Assessment & Plan Note (Addendum)
On Rocephin.  Cultures growing pansensitive E. coli. Will transition to Keflex.

## 2021-11-03 NOTE — Assessment & Plan Note (Addendum)
Nontraumatic.  Monitor.  Hold statin.

## 2021-11-04 DIAGNOSIS — I5181 Takotsubo syndrome: Secondary | ICD-10-CM | POA: Diagnosis not present

## 2021-11-04 DIAGNOSIS — E538 Deficiency of other specified B group vitamins: Secondary | ICD-10-CM | POA: Diagnosis present

## 2021-11-04 LAB — CBC WITH DIFFERENTIAL/PLATELET
Abs Immature Granulocytes: 0.13 10*3/uL — ABNORMAL HIGH (ref 0.00–0.07)
Basophils Absolute: 0.1 10*3/uL (ref 0.0–0.1)
Basophils Relative: 1 %
Eosinophils Absolute: 0.4 10*3/uL (ref 0.0–0.5)
Eosinophils Relative: 4 %
HCT: 25.9 % — ABNORMAL LOW (ref 36.0–46.0)
Hemoglobin: 8.5 g/dL — ABNORMAL LOW (ref 12.0–15.0)
Immature Granulocytes: 1 %
Lymphocytes Relative: 24 %
Lymphs Abs: 2.2 10*3/uL (ref 0.7–4.0)
MCH: 32.4 pg (ref 26.0–34.0)
MCHC: 32.8 g/dL (ref 30.0–36.0)
MCV: 98.9 fL (ref 80.0–100.0)
Monocytes Absolute: 0.7 10*3/uL (ref 0.1–1.0)
Monocytes Relative: 8 %
Neutro Abs: 6 10*3/uL (ref 1.7–7.7)
Neutrophils Relative %: 62 %
Platelets: 198 10*3/uL (ref 150–400)
RBC: 2.62 MIL/uL — ABNORMAL LOW (ref 3.87–5.11)
RDW: 14.4 % (ref 11.5–15.5)
WBC: 9.5 10*3/uL (ref 4.0–10.5)
nRBC: 0.3 % — ABNORMAL HIGH (ref 0.0–0.2)

## 2021-11-04 LAB — BPAM RBC
Blood Product Expiration Date: 202212192359
ISSUE DATE / TIME: 202211211355
Unit Type and Rh: 6200

## 2021-11-04 LAB — TYPE AND SCREEN
ABO/RH(D): A POS
Antibody Screen: NEGATIVE
Unit division: 0

## 2021-11-04 LAB — BASIC METABOLIC PANEL
Anion gap: 6 (ref 5–15)
BUN: 42 mg/dL — ABNORMAL HIGH (ref 8–23)
CO2: 15 mmol/L — ABNORMAL LOW (ref 22–32)
Calcium: 8.1 mg/dL — ABNORMAL LOW (ref 8.9–10.3)
Chloride: 117 mmol/L — ABNORMAL HIGH (ref 98–111)
Creatinine, Ser: 2.27 mg/dL — ABNORMAL HIGH (ref 0.44–1.00)
GFR, Estimated: 22 mL/min — ABNORMAL LOW (ref >60)
Glucose, Bld: 148 mg/dL — ABNORMAL HIGH (ref 70–99)
Potassium: 3.7 mmol/L (ref 3.5–5.1)
Sodium: 138 mmol/L (ref 135–145)

## 2021-11-04 LAB — OCCULT BLOOD X 1 CARD TO LAB, STOOL: Fecal Occult Bld: POSITIVE — AB

## 2021-11-04 LAB — CK: Total CK: 2075 U/L — ABNORMAL HIGH (ref 38–234)

## 2021-11-04 LAB — TROPONIN I (HIGH SENSITIVITY): Troponin I (High Sensitivity): 4267 ng/L (ref <18)

## 2021-11-04 MED ORDER — CLOPIDOGREL BISULFATE 75 MG PO TABS
75.0000 mg | ORAL_TABLET | Freq: Every day | ORAL | Status: DC
  Administered 2021-11-04 – 2021-11-05 (×2): 75 mg via ORAL
  Filled 2021-11-04 (×2): qty 1

## 2021-11-04 MED ORDER — VITAMIN B-12 1000 MCG PO TABS
1000.0000 ug | ORAL_TABLET | Freq: Every day | ORAL | Status: DC
  Administered 2021-11-05: 1000 ug via ORAL
  Filled 2021-11-04: qty 1

## 2021-11-04 MED ORDER — CYANOCOBALAMIN 1000 MCG/ML IJ SOLN
1000.0000 ug | Freq: Once | INTRAMUSCULAR | Status: AC
  Administered 2021-11-05: 1000 ug via SUBCUTANEOUS
  Filled 2021-11-04: qty 1

## 2021-11-04 MED ORDER — CYANOCOBALAMIN 1000 MCG/ML IJ SOLN
1000.0000 ug | Freq: Once | INTRAMUSCULAR | Status: AC
  Administered 2021-11-04: 1000 ug via SUBCUTANEOUS
  Filled 2021-11-04: qty 1

## 2021-11-04 NOTE — Progress Notes (Signed)
Physical Therapy Treatment Patient Details Name: Linda [name] MRN: [id number] DOB: February 08, 1946 Today's Date: 11/04/2021   History of Present Illness Pt is a 75 y/o F admitted on 10/31/21 after presenting from home with c/o worsening aphasia, weakness, cough & SOB. Head imaging showed no acute infarct. Pt was also found to have elevated troponin & cardiology was consulted.   PMH: stroke with aphasia, HTN, HLD, depression with anxiety, dementia, CKD IV, sCHF, brain aneurysm    PT Comments    Pt performed well with therapist today, noting an increase in activity level and consciousness.   Pt abel to ambulate further distances in room and perform STS x4.  Pt did have an episode of posterior leaning that required minA from therapist to correct.  Pt also developed wheezing after ambulating in the room.  Pt's nurse notified of wheezing and would likely be recommending a breathing treatment.  Pt otherwise steady throughout gait and pt and daughter educated on importance of ambulating and exercising in order to prevent muscle wasting.  Daughter educated on use of Gait Belt at home when necessary for assisting pt with ambulation/upright activities.  Pt and daughter agreeable with discharge to home with HHPT.  Current discharge plans to home with HHPT remain appropriate at this time.  Pt will continue to benefit from skilled therapy in order to address deficits listed below.     Recommendations for follow up therapy are one component of a multi-disciplinary discharge planning process, led by the attending physician.  Recommendations may be updated based on patient status, additional functional criteria and insurance authorization.  Follow Up Recommendations  Home health PT     Assistance Recommended at Discharge Frequent or constant Supervision/Assistance  Equipment Recommendations   (none - pt already has RW, [facility] & family will get w/c)    Recommendations for Other Services       Precautions /  Restrictions Precautions Precautions: Fall Precaution Comments: expressive difficulties Restrictions Weight Bearing Restrictions: No     Mobility  Bed Mobility               General bed mobility comments: Pt upright in recliner upon arrival to room.    Transfers Overall transfer level: Needs assistance Equipment used: Rolling walker (2 wheels) Transfers: Sit to/from Stand Sit to Stand: Min guard           General transfer comment: Pt requires verbal cuing for safe and effective hand placement to come upright into standing position.    Ambulation/Gait Ambulation/Gait assistance: Min guard Gait Distance (Feet): 120 Feet Assistive device: Rolling walker (2 wheels) Gait Pattern/deviations: Decreased step length - right;Decreased step length - left;Decreased stride length Gait velocity: decreased     General Gait Details: Pt able to [facility] well with shortened strides throughout session, but progressed to being much more comfortable taking steps.   Stairs             Wheelchair Mobility    Modified Rankin (Stroke Patients Only)       Balance Overall balance assessment: Needs assistance Sitting-balance support: Feet supported;No upper extremity supported;Bilateral upper extremity supported Sitting balance-Leahy Scale: Fair Sitting balance - Comments: supervision static sitting   Standing balance support: Bilateral upper extremity supported;During functional activity Standing balance-Leahy Scale: Fair Standing balance comment: BUE support on RW                            Cognition Arousal/Alertness: Awake/alert Behavior During Therapy:  WFL for tasks assessed/performed Overall Cognitive Status: Difficult to assess                                 General Comments: Pt communicating effectively throughout session today.        Exercises      General Comments        Pertinent Vitals/Pain Pain Assessment: No/denies  pain    Home Living                          Prior Function            PT Goals (current goals can now be found in the care plan section) Acute Rehab PT Goals Patient Stated Goal: to take pt home PT Goal Formulation: With family Time For Goal Achievement: 11/15/21 Potential to Achieve Goals: Good Progress towards PT goals: Progressing toward goals    Frequency    Min 2X/week      PT Plan Current plan remains appropriate    Co-evaluation              AM-PAC PT "6 Clicks" Mobility   Outcome Measure  Help needed turning from your back to your side while in a flat bed without using bedrails?: None Help needed moving from lying on your back to sitting on the side of a flat bed without using bedrails?: None Help needed moving to and from a bed to a chair (including a wheelchair)?: A Little Help needed standing up from a chair using your arms (e.g., wheelchair or bedside chair)?: A Little Help needed to walk in hospital room?: A Little Help needed climbing 3-5 steps with a railing? : A Lot 6 Click Score: 19    End of Session Equipment Utilized During Treatment: Gait belt Activity Tolerance: Patient limited by fatigue Patient left: in chair;with chair alarm set;with call bell/phone within reach;with family/visitor present;with nursing/sitter in room Nurse Communication: Mobility status PT Visit Diagnosis: Unsteadiness on feet (R26.81);Pain;Muscle weakness (generalized) (M62.81);Difficulty in walking, not elsewhere classified (R26.2);History of falling (Z91.81)     Time: 2336-1224 PT Time Calculation (min) (ACUTE ONLY): 25 min  Charges:  $Gait Training: 23-37 mins                     [name], PT, DPT 11/04/21, 2:12 PM    [name] 11/04/2021, 2:06 PM

## 2021-11-04 NOTE — Progress Notes (Signed)
Progress Note    Linda [name]   BSJ:[id number]  DOB: [birth year]  DOA: 10/31/2021     4 Date of Service: 11/04/2021   Clinical Course 75 y.o. female with medical history significant of stroke with aphasia, hypertension, hyperlipidemia, depression with anxiety, dementia, CKD-IV, sCHF, brain aneurysm, former smoker, who presents with worsening facial and difficulty walking.   11/19: Negative stroke work-up and her speech is back to baseline per family.  Her right side weakness is resolved. Cardiology recommends 48 hours of heparin infusion with uncertainty of possible MI versus Takotsubo cardiomyopathy.  Worsening CK.  Nephrology consultation 11/20: CK still trending up.  Nephrology seen, cardiology not planning for cath considering renal dysfunction 11/21: 1 PRBC ordered. 11/22, B12 severely low.  Initiating injection B12.  H&H remained stable.  FOBT positive.   Assessment and Plan * Takotsubo cardiomyopathy Elevated troponin.  Echo showing akinetic anteroseptal wall and apex.  Troponin peaked at 6500 and now trending down.  Patient is not having any chest pain.  Cardiology recommends conservative management with heparin infusion 48 hours.  Continue low-dose metoprolol.  Due to her renal dysfunction ischemic evaluation will be deferred while here in the hospital per cardiology.  Cannot give statin due to elevated CK/rhabdo.    Acute respiratory failure with hypoxia (HCC) Oxygenation worsening to 84% on room air in ER.  Placed on 4 L of oxygen.  Currently resolving.  On room air.  Atypical pneumonia Treated with Rocephin, Flagyl and Zithromax. We will switch to doxycycline and Keflex.  Completed antibiotic treatment course in the hospital.  E. coli UTI (urinary tract infection)-resolved as of 11/03/2021 On Rocephin.  Cultures growing pansensitive E. coli. Will transition to Keflex.  Elevated troponin Cardiology consulted.  Manage conservatively.  Was given IV heparin and on aspirin  and Plavix.  Plavix will be resumed.  TIA (transient ischemic attack) Symptoms resolved.  Continue aspirin and statin.  Added Plavix. Which is currently on hold due to concern with anemia. Stroke work-up essentially negative including MRI of the brain, carotid Dopplers and echo.  Chronic systolic CHF (congestive heart failure) (HCC) Appears to be euvolemic. Echo shows EF of 30 to 35%.  Continue metoprolol, aspirin.  Treated with heparin infusion for 48 hours.  Currently on hold secondary to anemia. Unable to restart losartan due to renal dysfunction.  Cannot give Lipitor due to rhabdo.   CKD (chronic kidney disease), stage IV (HCC) Baseline creatinine around 1.6-1.9.  Admission creatinine of 2.94> 2.74  Continue IV hydration. nephrology consulted and seen  Sepsis ([place]) Met SIRS criteria on admission with organ damage with AKI and hypoxia. Due to atypical pneumonia and or possible aspiration.  Normal lactic acid.  Continue IV hydration Sepsis physiology resolving.  Severe B12 deficiency, B12 level 91 Patient's B12 is severely low. This was checked in the response to anemia. Will provide subcutaneous B12 injections for 2 days. Will also replace other vitamins. Outpatient follow-up with PCP. Recommend injections B12 if PCP can arrange.   Rhabdomyolysis CK of 3287> 3392.  Monitor.  Hold statin.  Continue IV fluids and monitor CK  Other specified anemias Present on admission.  Hemoglobin 10.5 at the time of admission.  Progressively worsening to hemoglobin of 7.5 right now.  We will transfuse 1 PRBC in the setting of elevated troponin and concern for Takotsubo.   H&H remained stable. FOBT was positive but monitor.  This appears to be multifactorial.  Depression with anxiety Continue Celexa.  Essential hypertension Continue metoprolol, as needed  hydralazine.  Hyperlipidemia Lipitor on hold due to elevated CK/rhabdo.     Subjective:  Feeling better.  No nausea no vomiting.   Wants to go home.  Had a loose BM without any blood.  But Hemoccult was positive  Objective Vitals:   11/04/21 0858 11/04/21 0900 11/04/21 1100 11/04/21 1153  BP:    (!) 135/58  Pulse:    70  Resp: _0 Temp:    (!) 97.4 F (36.3 C)  TempSrc:    Oral  SpO2:    98%  Weight:      Height:       72.7 kg  Exam General: Appear in mild distress, no Rash; Oral Mucosa Clear, moist. no Abnormal Neck Mass Or lumps, Conjunctiva normal  Cardiovascular: S1 and S2 Present, no Murmur, Respiratory: good respiratory effort, Bilateral Air entry present and CTA, no Crackles, no wheezes Abdomen: Bowel Sound present, Soft and no tenderness Extremities: no Pedal edema Neurology: alert and oriented to time, place, and person affect appropriate. no new focal deficit Gait not checked due to patient safety concerns    Labs / Other Information My review of labs, imaging, notes and other tests is significant for     stable hemoglobin.  Improving renal function.   Disposition Plan: Status is: Inpatient  Remains inpatient appropriate because: Requires B12 injection therapy for severely low B12, FOBT positive.  Need Plavix therefore ensuring no further bleeding.  Time spent: 35 minutes [facility] 11/04/2021, 7:17 PM

## 2021-11-04 NOTE — Consult Note (Signed)
[facility] Cardiology  CARDIOLOGY CONSULT NOTE  Patient ID: Linda [name] MRN: [id number] DOB/AGE: 1946/02/02 75 y.o.  Admit date: 10/31/2021 Referring Physician Ivor Costa Primary Physician Feldpausch, Chrissie Noa, MD Primary Cardiologist None Reason for Consultation NSTEMI  HPI:  Linda [name] is a 75 year old female with history of CVA with residual aphasia, hypertension, hyperlipidemia, dementia, CKD 4, chronic diastolic heart failure who was admitted with worse facial droop and difficulty walking; found down in her bathroom with evidence of rhabdomyolysis (AKI elevated CK).  Cardiology is consulted because the troponin is elevated to 7000 and echocardiogram shows a hypokinetic anterior wall and apex.  Troponin subsequently decreased.  The patient has no chest pain.  Interval history:  - s/p 1 U PRBC yesterday. Hgb responded appropriately.  - CK decreasing.  - This morning she does not remember who I am.  She also cannot recall the conversation that we had less than 1 minute after having it. Unclear if this is her baseline.  - Denies shortness of breath or chest pain.   Review of systems complete and found to be negative unless listed above     Past Medical History:  Diagnosis Date   Anxiety    Benign neoplasm of colon 03/27/2014   Brain aneurysm 2006   Cerebrovascular disease 06/28/2014   Chronic kidney disease    Dementia, vascular ([place]) 06/19/2014   Depression    Hyperlipidemia 02/20/2019   Pure hypercholesterolemia 03/27/2014   Receptive aphasia    Sleep apnea    [facility]    Vascular dementia ([place])    Wears dentures     Past Surgical History:  Procedure Laterality Date   CATARACT EXTRACTION W/PHACO Left 04/27/2018   Procedure: CATARACT EXTRACTION PHACO AND INTRAOCULAR LENS PLACEMENT ([place]) LEFT;  Surgeon: Leandrew Koyanagi, MD;  Location: [place];  Service: Ophthalmology;  Laterality: Left;   CATARACT EXTRACTION W/PHACO Right 06/14/2018   Procedure: CATARACT  EXTRACTION PHACO AND INTRAOCULAR LENS PLACEMENT ([place])  RIGHT;  Surgeon: Leandrew Koyanagi, MD;  Location: [place];  Service: Ophthalmology;  Laterality: Right;   CEREBRAL ANEURYSM REPAIR      Medications Prior to Admission  Medication Sig Dispense Refill Last Dose   amLODipine (NORVASC) 2.5 MG tablet Take 2.5 mg by mouth daily.   10/30/2021 at 0800   atorvastatin (LIPITOR) 40 MG tablet Take 1 tablet (40 mg total) by mouth daily. 30 tablet 2 10/30/2021 at 0800   citalopram (CELEXA) 20 MG tablet Take 20 mg by mouth daily.   10/30/2021 at 0800   clopidogrel (PLAVIX) 75 MG tablet Take 1 tablet (75 mg total) by mouth daily. 30 tablet 2 10/30/2021 at 0800   losartan (COZAAR) 100 MG tablet Take 100 mg by mouth daily.   10/30/2021 at 0800   MYRBETRIQ 50 MG TB24 tablet Take 1 tablet by mouth once daily 30 tablet 11 10/30/2021 at 0800   Vitamin D, Ergocalciferol, (DRISDOL) 50000 units CAPS capsule Take 50,000 Units by mouth once a week.    Past Week at Unknown    Social History   Socioeconomic History   Marital status: Married    Spouse name: Not on file   Number of children: Not on file   Years of education: Not on file   Highest education level: Not on file  Occupational History   Not on file  Tobacco Use   Smoking status: Former    Packs/day: 1.00    Types: Cigarettes   Smokeless tobacco: Never  Vaping Use  Vaping Use: Never used  Substance and Sexual Activity   Alcohol use: Not Currently   Drug use: Never   Sexual activity: Not on file  Other Topics Concern   Not on file  Social History Narrative   Not on file   Social Determinants of Health   Financial Resource Strain: Not on file  Food Insecurity: Not on file  Transportation Needs: Not on file  Physical Activity: Not on file  Stress: Not on file  Social Connections: Not on file  Intimate Partner Violence: Not on file    Family History  Problem Relation Age of Onset   Dementia Mother    Heart attack  Brother       Review of systems complete and found to be negative unless listed above      PHYSICAL EXAM  General: Well developed, well nourished, in no acute distress HEENT:  Normocephalic and atramatic Neck:  No JVD.  Lungs: Clear bilaterally to auscultation and percussion. Heart: HRRR . Normal S1 and S2 without gallops or murmurs.  Abdomen: Bowel sounds are positive, abdomen soft and non-tender  Msk:  Back normal, normal gait. Normal strength and tone for age. Extremities: No clubbing, cyanosis or edema.   Neuro: Alert and oriented X 3. Psych:  Good affect, responds appropriately  Labs:   Lab Results  Component Value Date   WBC 11.2 (H) 11/03/2021   HGB 8.8 (L) 11/03/2021   HCT 26.8 (L) 11/03/2021   MCV 99.3 11/03/2021   PLT 204 11/03/2021    Recent Labs  Lab 10/31/21 0648 11/02/21 0451 11/03/21 0504  NA 136   < > 137  K 5.1   < > 3.5  CL 106   < > 115*  CO2 20*   < > 16*  BUN 52*   < > 53*  CREATININE 2.94*   < > 2.66*  CALCIUM 9.2   < > 8.1*  PROT 8.2*  --   --   BILITOT 0.5  --   --   ALKPHOS 48  --   --   ALT 23  --   --   AST 59*  --   --   GLUCOSE 118*   < > 105*   < > = values in this interval not displayed.    Lab Results  Component Value Date   CKTOTAL 2,075 (H) 11/04/2021   TROPONINI < 0.02 03/17/2012     Lab Results  Component Value Date   CHOL 120 11/01/2021   CHOL 138 06/28/2020   CHOL 136 06/15/2020   Lab Results  Component Value Date   HDL 43 11/01/2021   HDL 53 06/28/2020   HDL 40 (L) 06/15/2020   Lab Results  Component Value Date   LDLCALC 63 11/01/2021   LDLCALC 73 06/28/2020   LDLCALC 76 06/15/2020   Lab Results  Component Value Date   TRIG 71 11/01/2021   TRIG 58 06/28/2020   TRIG 99 06/15/2020   Lab Results  Component Value Date   CHOLHDL 2.8 11/01/2021   CHOLHDL 2.6 06/28/2020   CHOLHDL 3.4 06/15/2020   No results found for: LDLDIRECT    Radiology: MR ANGIO HEAD WO CONTRAST  Result Date:  10/31/2021 CLINICAL DATA:  Neuro deficit, acute, stroke suspected EXAM: MRI HEAD WITHOUT CONTRAST MRA HEAD WITHOUT CONTRAST TECHNIQUE: Multiplanar, multi-echo pulse sequences of the brain and surrounding structures were acquired without intravenous contrast. Angiographic images of the Circle of Willis were acquired using MRA technique without  intravenous contrast. COMPARISON:  10/15/2021 FINDINGS: MRI HEAD FINDINGS Brain: No acute infarction or hemorrhage. Encephalomalacia and gliosis in the left cerebral hemisphere with involvement of frontal, parietal, temporal, and occipital lobes. Associated chronic blood products particularly in the temporal lobe. Ex vacuo dilatation of the left lateral ventricle. Chronic small vessel infarcts of the central gray nuclei, central white matter, pons, and cerebellum. Few additional scattered foci of susceptibility likely reflecting chronic microhemorrhages. Other stable confluent areas of T2 hyperintensity in the supratentorial and pontine white matter are nonspecific but probably reflect advanced chronic microvascular ischemic changes. Prominence of the ventricles and sulci reflects parenchymal volume loss. Vascular: Susceptibility artifact related to left MCA aneurysm coiling. Skull and upper cervical spine: Marrow signal is within normal limits. Sinuses/Orbits: Chronic left sphenoid sinusitis. Bilateral lens replacements. Other: Suprasellar mass along pituitary infundibulum is not well evaluated on this study. Persistent bilateral mastoid effusions. MRA HEAD FINDINGS Anterior circulation: Intracranial internal carotid arteries are patent with atherosclerotic irregularity. Anterior cerebral arteries are patent. Middle cerebral arteries are patent. There is susceptibility artifact at site of left MCA bifurcation aneurysm coiling. No evidence of recurrence. Posterior circulation: Intracranial vertebral arteries are patent. The right vertebral artery terminates as a PICA. Basilar  artery is patent. Posterior cerebral arteries are patent. Bilateral posterior communicating arteries are present. Fetal origin of the left PCA. IMPRESSION: No acute infarction or other acute abnormality. Stable chronic findings compared to recent prior study. No significant vascular abnormality. 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 Electronically Signed   By: Macy Mis M.D.   On: 10/31/2021 12:42   MR BRAIN WO CONTRAST  Result Date: 10/31/2021 CLINICAL DATA:  Neuro deficit, acute, stroke suspected EXAM: MRI HEAD WITHOUT CONTRAST MRA HEAD WITHOUT CONTRAST TECHNIQUE: Multiplanar, multi-echo pulse sequences of the brain and surrounding structures were acquired without intravenous contrast. Angiographic images of the Circle of Willis were acquired using MRA technique without intravenous contrast. COMPARISON:  10/15/2021 FINDINGS: MRI HEAD FINDINGS Brain: No acute infarction or hemorrhage. Encephalomalacia and gliosis in the left cerebral hemisphere with involvement of frontal, parietal, temporal, and occipital lobes. Associated chronic blood products particularly in the temporal lobe. Ex vacuo dilatation of the left lateral ventricle. Chronic small vessel infarcts of the central gray nuclei, central white matter, pons, and cerebellum. Few additional scattered foci of susceptibility likely reflecting chronic microhemorrhages. Other stable confluent areas of T2 hyperintensity in the supratentorial and pontine white matter are nonspecific but probably reflect advanced chronic microvascular ischemic changes. Prominence of the ventricles and sulci reflects parenchymal volume loss. Vascular: Susceptibility artifact related to left MCA aneurysm coiling. Skull and upper cervical spine: Marrow signal is within normal limits. Sinuses/Orbits: Chronic left sphenoid sinusitis. Bilateral lens replacements. Other: Suprasellar mass along pituitary infundibulum is not well evaluated  on this study. Persistent bilateral mastoid effusions. MRA HEAD FINDINGS Anterior circulation: Intracranial internal carotid arteries are patent with atherosclerotic irregularity. Anterior cerebral arteries are patent. Middle cerebral arteries are patent. There is susceptibility artifact at site of left MCA bifurcation aneurysm coiling. No evidence of recurrence. Posterior circulation: Intracranial vertebral arteries are patent. The right vertebral artery terminates as a PICA. Basilar artery is patent. Posterior cerebral arteries are patent. Bilateral posterior communicating arteries are present. Fetal origin of the left PCA. IMPRESSION: No acute infarction or other acute abnormality. Stable chronic findings compared to recent prior study. No significant vascular abnormality. 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 Electronically Signed   By: Malachi Carl  Patel M.D.   On: 10/31/2021 12:42   MR BRAIN W WO CONTRAST  Result Date: 10/15/2021 CLINICAL DATA:  Follow-up suprasellar mass. EXAM: MRI HEAD WITHOUT AND WITH CONTRAST TECHNIQUE: Multiplanar, multiecho pulse sequences of the brain and surrounding structures were obtained without and with intravenous contrast. 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 This is in the midline and is in the area of the infundibulum. This is just above the pituitary but appears separate from the pituitary which otherwise appears normal. Generalized atrophy with ventricular enlargement which is similar to the prior study. There is encephalomalacia and chronic infarction in the left frontal and temporal lobe. Chronic blood products are present in the left temporal infarct. Prior aneurysm coiling in the left MCA. Advanced chronic microvascular ischemic changes in the white matter. Negative for acute infarct.  Vascular: Normal arterial flow voids. Aneurysm coiling left MCA bifurcation region. Skull and upper cervical spine: No focal lesion. Sinuses/Orbits: Mucosal edema in the paranasal sinuses. Bilateral mastoid effusion. 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 This is just above the pituitary and appears separate from the pituitary. Extensive atrophy. Extensive chronic microvascular ischemic change. Chronic left MCA infarct. Left MCA bifurcation aneurysm coiling. Electronically Signed   By: Franchot Gallo M.D.   On: 10/15/2021 16:05   US Carotid Bilateral (at [facility] and [facility] only)  Result Date: 10/31/2021 CLINICAL DATA:  Stroke, hyperlipidemia, former smoker EXAM: BILATERAL CAROTID DUPLEX ULTRASOUND TECHNIQUE: Pearline Cables scale imaging, color Doppler and duplex ultrasound were performed of bilateral carotid and vertebral arteries in the neck. COMPARISON:  None. FINDINGS: Criteria: Quantification of carotid stenosis is based on velocity parameters that correlate the residual internal carotid diameter with NASCET-based stenosis levels, using the diameter of the distal internal carotid lumen as the denominator for stenosis measurement. The following velocity measurements were obtained: RIGHT ICA: 71/27 cm/sec CCA: 81/19 cm/sec SYSTOLIC ICA/CCA RATIO:  1.0 ECA: 47 cm/sec LEFT ICA: 127/36 cm/sec CCA: 14/78 cm/sec SYSTOLIC ICA/CCA RATIO:  2.8 ECA: 46 cm/sec RIGHT CAROTID ARTERY: Moderate mixed echogenicity carotid bifurcation atherosclerosis. Despite this, no significant ICA stenosis, velocity elevation, turbulent flow. Degree of narrowing less than 50% by ultrasound criteria. RIGHT VERTEBRAL ARTERY:  Normal antegrade flow LEFT CAROTID ARTERY: Similar moderate mixed echogenicity irregular bifurcation atherosclerosis. Negative for significant stenosis, velocity elevation, turbulent flow. Degree of narrowing also less than 50% by  ultrasound criteria. LEFT VERTEBRAL ARTERY:  Normal antegrade flow IMPRESSION: Bilateral carotid atherosclerosis. Negative for significant ICA stenosis. Degree of narrowing less than 50% bilaterally by ultrasound criteria. Patent antegrade vertebral flow bilaterally Electronically Signed   By: Jerilynn Mages.  Shick M.D.   On: 10/31/2021 14:25   DG Chest Portable 1 View  Result Date: 10/31/2021 CLINICAL DATA:  75 year old female code stroke presentation. Cough for 1 week. EXAM: PORTABLE CHEST 1 VIEW COMPARISON:  Chest radiographs 03/17/2012. FINDINGS: Portable AP upright view at 0652 hours. Chronic large lung volumes. Mild cardiomegaly appears new since 2013. Other mediastinal contours are within normal limits. Visualized tracheal air column is within normal limits. Chronic but increased and coarse bilateral pulmonary interstitial opacity, fairly symmetric and with some basilar predominance. No pneumothorax, pleural effusion or confluent opacity. No acute osseous abnormality identified. Negative visible bowel gas. IMPRESSION: Chronic hyperinflation with acute on chronic bilateral pulmonary interstitial opacity. Top differential considerations include viral/atypical respiratory infection, pulmonary edema, progressed chronic  lung disease. Mild cardiomegaly.  No pleural effusion. Electronically Signed   By: Genevie Ann M.D.   On: 10/31/2021 07:14   ECHOCARDIOGRAM COMPLETE  Result Date: 10/31/2021    ECHOCARDIOGRAM REPORT   Patient Name:   Linda [name] Date of Exam: 10/31/2021 Medical Rec #:  [id number]      Height:       66.0 in Accession #:    [id number]     Weight:       160.3 lb Date of Birth:  03-07-1946      BSA:          1.820 m Patient Age:    8 years       BP:           145/87 mmHg Patient Gender: F              HR:           85 bpm. Exam Location:  [facility] Procedure: 2D Echo, Cardiac Doppler and Color Doppler Indications:     Stroke I63.9  History:         Patient has no prior history of Echocardiogram examinations.                   Stroke; Risk Factors:Dyslipidemia. CKD.  Sonographer:     Sherrie Sport Referring Phys:  Unknown Foley NIU Diagnosing Phys: Donnelly Angelica  Sonographer Comments: Suboptimal apical window. IMPRESSIONS  1. Left ventricular ejection fraction, by estimation, is 30 to 35%. The left ventricle has moderately decreased function. The left ventricle demonstrates regional wall motion abnormalities (see scoring diagram/findings for description). Left ventricular  diastolic parameters are consistent with Grade I diastolic dysfunction (impaired relaxation).  2. Right ventricular systolic function is normal. The right ventricular size is normal.  3. The mitral valve is normal in structure. Mild mitral valve regurgitation. No evidence of mitral stenosis.  4. The aortic valve was not well visualized. Aortic valve regurgitation is not visualized. No aortic stenosis is present. 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 FINDINGS  Left Ventricle: Left ventricular ejection fraction, by estimation, is 30 to 35%. The left ventricle has moderately decreased function. The left ventricle demonstrates regional wall motion abnormalities. The left ventricular internal cavity size was normal in size. There is no left ventricular hypertrophy. Left ventricular diastolic parameters are consistent with Grade I diastolic dysfunction (impaired relaxation).  LV Wall Scoring: The entire septum is akinetic. The basal anterior segment is hypokinetic. Right Ventricle: The right ventricular size is normal. No increase in right ventricular wall thickness. Right ventricular systolic function is normal. Left Atrium: Left atrial size was normal in size. Right Atrium: Right atrial size was normal in size. Pericardium: There is no evidence of pericardial effusion. Mitral Valve: The mitral valve is normal in structure. Mild mitral valve regurgitation. No evidence of mitral valve stenosis. MV peak  gradient, 5.9 mmHg. The mean mitral valve gradient is 2.0 mmHg. Tricuspid Valve: The tricuspid valve is normal in structure. Tricuspid valve regurgitation is mild. Aortic Valve: The aortic valve was not well visualized. Aortic valve regurgitation is not visualized. No aortic stenosis is present. Aortic valve mean gradient measures 1.5 mmHg. Aortic valve peak gradient measures 3.3 mmHg. Aortic valve area, by VTI measures 4.42 cm. Pulmonic Valve: The pulmonic valve was not well visualized. Pulmonic valve regurgitation is not visualized. Aorta: The aortic root was not well visualized. IAS/Shunts: The interatrial septum was not assessed.  LEFT VENTRICLE PLAX 2D LVIDd:  4.00 cm     Diastology LVIDs:         3.40 cm     LV e' medial:    3.92 cm/s LV PW:         1.20 cm     LV E/e' medial:  12.6 LV IVS:        0.80 cm     LV e' lateral:   5.00 cm/s LVOT diam:     2.10 cm     LV E/e' lateral: 9.9 LV SV:         63 LV SV Index:   35 LVOT Area:     3.46 cm  LV Volumes (MOD) LV vol d, MOD A2C: 84.5 ml LV vol d, MOD A4C: 64.1 ml LV vol s, MOD A2C: 71.1 ml LV vol s, MOD A4C: 34.9 ml LV SV MOD A2C:     13.4 ml LV SV MOD A4C:     64.1 ml LV SV MOD BP:      24.6 ml RIGHT VENTRICLE RV Basal diam:  3.50 cm RV S prime:     10.30 cm/s TAPSE (M-mode): 3.4 cm LEFT ATRIUM             Index        RIGHT ATRIUM           Index LA diam:        3.00 cm 1.65 cm/m   RA Area:     10.40 cm LA Vol (A2C):   48.2 ml 26.48 ml/m  RA Volume:   21.10 ml  11.59 ml/m LA Vol (A4C):   67.4 ml 37.03 ml/m LA Biplane Vol: 62.3 ml 34.23 ml/m  AORTIC VALVE                    PULMONIC VALVE AV Area (Vmax):    3.78 cm     PV Vmax:        0.59 m/s AV Area (Vmean):   3.81 cm     PV Vmean:       38.300 cm/s AV Area (VTI):     4.42 cm     PV VTI:         0.083 m AV Vmax:           90.35 cm/s   PV Peak grad:   1.4 mmHg AV Vmean:          59.950 cm/s  PV Mean grad:   1.0 mmHg AV VTI:            0.144 m      RVOT Peak grad: 2 mmHg AV Peak Grad:      3.3  mmHg AV Mean Grad:      1.5 mmHg LVOT Vmax:         98.60 cm/s LVOT Vmean:        66.000 cm/s LVOT VTI:          0.183 m LVOT/AV VTI ratio: 1.28  AORTA Ao Root diam: 2.70 cm MITRAL VALVE               TRICUSPID VALVE MV Area (PHT): 5.34 cm    TR Peak grad:   43.8 mmHg MV Area VTI:   3.88 cm    TR Vmax:        331.00 cm/s MV Peak grad:  5.9 mmHg MV Mean grad:  2.0 mmHg    SHUNTS MV Vmax:       1.21  m/s    Systemic VTI:  0.18 m MV Vmean:      70.4 cm/s   Systemic Diam: 2.10 cm MV Decel Time: 142 msec    Pulmonic VTI:  0.116 m MV E velocity: 49.30 cm/s MV A velocity: 84.80 cm/s MV E/A ratio:  0.58 Donnelly Angelica Electronically signed by Donnelly Angelica Signature Date/Time: 10/31/2021/3:14:43 PM    Final    DG HIP UNILAT WITH PELVIS 2-3 VIEWS RIGHT  Result Date: 11/02/2021 CLINICAL DATA:  Right hip pain of unknown origin.  Dementia. EXAM: DG HIP (WITH OR WITHOUT PELVIS) 2-3V RIGHT COMPARISON:  None. FINDINGS: Exam demonstrates diffuse decreased bone mineralization. There mild symmetric degenerative changes of the hips. No evidence of acute fracture or dislocation. No focal bony abnormality over the right hip. Degenerative changes of the spine and sacroiliac joints. IMPRESSION: 1. No acute findings. 2. Mild symmetric degenerative change of the hips. Electronically Signed   By: Marin Olp M.D.   On: 11/02/2021 08:45   CT HEAD CODE STROKE WO CONTRAST  Result Date: 10/31/2021 CLINICAL DATA:  Code stroke.  75 year old female with aphasia. EXAM: CT HEAD WITHOUT CONTRAST TECHNIQUE: Contiguous axial images were obtained from the base of the skull through the vertex without intravenous contrast. COMPARISON:  Recent brain MRI 10/15/2021.  Head CT 06/27/2020. 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 Fairly extensive chronic encephalomalacia in the left hemisphere involving both the MCA and PCA territories. Superimposed advanced bilateral cerebral white matter  hypodensity. Small area of chronic encephalomalacia in the anterior right MCA territory. Advanced small vessel disease throughout the bilateral deep gray nuclei. And chronic small vessel disease also in the brainstem and bilateral cerebellar hemispheres. Ex vacuo ventricular enlargement is stable from earlier this month. No superimposed midline shift, mass effect, intracranial hemorrhage or evidence of cortically based acute infarction. 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 Vascular: Calcified atherosclerosis at the skull base. 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 No suspicious intracranial vascular hyperdensity. Skull: No acute osseous abnormality identified. Sinuses/Orbits: Chronic sinusitis with scattered mucoperiosteal thickening has not significantly changed from last year. Mild right mastoid effusion not significantly changed. Other: No acute orbit or scalp soft tissue finding. ASPECTS [facility] Stroke Program Early CT Score) Total score (0-10 with 10 being normal): 10 (chronic encephalomalacia) IMPRESSION: 1. Severe chronic ischemic disease. No acute cortically based infarct or acute intracranial hemorrhage identified. 2. Previous left MCA aneurysm coil embolization. Study discussed by telephone with Dr. Pryor Curia on 10/31/2021 at 06:36 . Electronically Signed   By: Genevie Ann M.D.   On: 10/31/2021 06:37    EKG: Sinus rhythm with nonspecific ST and T wave abnormalities; there is 1 mm of ST elevation in lead V4 but isolated.  Echo 10/31/21- 1. Left ventricular ejection fraction, by estimation, is 30 to 35%. The  left ventricle has moderately decreased function. The left ventricle  demonstrates regional wall motion abnormalities (see scoring  diagram/findings for description). Left ventricular   diastolic parameters are consistent with Grade I diastolic dysfunction  (impaired relaxation).   2. Right ventricular systolic function is normal. The right ventricular   size is normal.   3. The mitral valve is normal in structure. Mild mitral valve  regurgitation. No evidence of mitral stenosis.   4. The aortic valve was not well visualized. Aortic valve regurgitation  is not visualized. No aortic stenosis is present.   ASSESSMENT AND PLAN:   Linda [name] is  a 75 year old female with history of CVA with residual aphasia, hypertension, hyperlipidemia, dementia, CKD 4, chronic diastolic heart failure who was admitted with worse facial droop and difficulty walking; found down in her bathroom with evidence of rhabdomyolysis (AKI elevated CK).  Cardiology is consulted because the troponin is elevated to 7000 and echocardiogram shows a hypokinetic anterior wall and apex.  Troponin subsequently decreased.  The patient has no chest pain.  # NSTEMI d/t missed MI versus Stress cardiomyopathy # Apical/ anterior akinesis # TIA # Rhabdomyolysis and AoCKD # Anemia requiring blood transfusion The patient is admitted with speech abnormality and weakness.  Her neuro deficits have resolved, but she has worsening renal function and AKI c/w rhabdo. As part of her stroke evaluation echocardiogram was completed that showed an akinetic anteroseptal wall and apex and a troponin that was elevated to 6500 and decreasing therafter.  Per report she did not have any chest pain prior to this, and she very clearly denies chest pain at this time.  Most likely this is Takotsubo in the setting of her stroke/TIA/rhabdo, but it is also reasonable to consider missed MI in the last several days.  Given her renal insufficiency, neurologic findings, and anemia that has developed we will manage conservatively.  We will consider an ischemic evaluation depending on how her renal function improves and trend in Hgb; likely outpatient.  Distantly it appears that she has relatively severe dementia and is high risk for procedural complications. -Continue aspirin indefinitely - Plavix for 12 months; anemia  thought to be dilutional -s/p heparin x 48 hrs -Holding Lipitor 40 mg daily d/t rhabdo. Will resume as outpatient -Metoprolol XL 12.5 mg daily  - No ACE/ARB d/t renal function -She will need an ischemic evaluation, but renal function and and anemia are major barriers. Additionally patient is adamant to go home. Will plan for close outpatient follow up and determination for ischemic evaluation at that time.  - f/u with me 1-2 weeks after DC  Signed: Andrez Grime MD 11/04/2021, 7:40 AM

## 2021-11-04 NOTE — Progress Notes (Signed)
[facility]  ROUNDING NOTE   Subjective:   [name] was admitted to [facility] on 10/31/2021 for Aphasia [R47.01] TIA (transient ischemic attack) [G45.9] Stroke [facility]) [I63.9] NSTEMI (non-ST elevated myocardial infarction) ([place]) [I21.4] Fall, initial encounter [W19.XXXA] Altered mental status, unspecified altered mental status type [R41.82]  Patient was admitted to new onset facial droop and abnormal gait. Found to have acute coronary syndrome on admission. Patient was diagnosed with Takotsubo cardiomyopathy. Also found to have rhabdomyolysis.   Patient seen sitting up in chair, daughter at bedside Alert with some confusion Ate a good meal, denies nausea and vomiting Denies shortness of breath Asking to go home  Objective:  Vital signs in last 24 hours:  Temp:  [97.4 F (36.3 C)-99.3 F (37.4 C)] 97.4 F (36.3 C) (11/22 1153) Pulse Rate:  [69-79] 70 (11/22 1153) Resp:  [16-21] 18 (11/22 1153) BP: (124-166)/(58-85) 135/58 (11/22 1153) SpO2:  [95 %-100 %] 98 % (11/22 1153)  Weight change:  Filed Weights   10/31/21 0703  Weight: 72.7 kg    Intake/Output: I/O last 3 completed shifts: In: 2420 [P.O.:720; I.V.:1349; Blood:351] Out: 2225 [Urine:2225]   Intake/Output this shift:  Total I/O In: -  Out: 200 [Urine:200]  Physical Exam: General: NAD, sitting up in chair  Head: Normocephalic, atraumatic. Moist oral mucosal membranes  Lungs:  Clear to auscultation, room air  Heart: Irregular rhythm  Abdomen:  Soft, nontender  Extremities:  trace peripheral edema.  Neurologic: Nonfocal, moving all four extremities, following commands. Right sided weakness.   Skin: No lesions        Basic Metabolic Panel: Recent Labs  Lab 10/31/21 0648 11/02/21 0451 11/03/21 0504 11/04/21 0907  NA 136 138 137 138  K 5.1 4.2 3.5 3.7  CL 106 112* 115* 117*  CO2 20* 17* 16* 15*  GLUCOSE 118* 102* 105* 148*  BUN 52* 58* 53* 42*  CREATININE 2.94* 2.74* 2.66* 2.27*   CALCIUM 9.2 8.2* 8.1* 8.1*     Liver Function Tests: Recent Labs  Lab 10/31/21 0648  AST 59*  ALT 23  ALKPHOS 48  BILITOT 0.5  PROT 8.2*  ALBUMIN 3.9    No results for input(s): LIPASE, AMYLASE in the last 168 hours. No results for input(s): AMMONIA in the last 168 hours.  CBC: Recent Labs  Lab 10/31/21 0648 11/01/21 0606 11/02/21 0451 11/03/21 0504 11/03/21 0758 11/03/21 1730 11/04/21 0907  WBC 14.4*   < > 13.3* 11.6* 11.8* 11.2* 9.5  NEUTROABS 12.4*  --   --   --  7.9*  --  6.0  HGB 10.5*   < > 8.0* 7.5* 7.7* 8.8* 8.5*  HCT 33.0*   < > 25.6* 23.2* 23.6* 26.8* 25.9*  MCV 99.1   < > 100.0 98.7 98.3 99.3 98.9  PLT 291   < > 238 226 242 204 198   < > = values in this interval not displayed.     Cardiac Enzymes: Recent Labs  Lab 10/31/21 0648 11/01/21 0606 11/02/21 0451 11/03/21 0504 11/04/21 0308  CKTOTAL 2,335* 3,287* 3,392* 3,079* 2,075*     BNP: Invalid input(s): POCBNP  CBG: Recent Labs  Lab 10/31/21 0622  GLUCAP 127*     Microbiology: Results for orders placed or performed during the hospital encounter of 10/31/21  Resp Panel by RT-PCR (Flu A&B, Covid) Nasopharyngeal Swab     Status: None   Collection Time: 10/31/21 12:48 AM   Specimen: Nasopharyngeal Swab; Nasopharyngeal(NP) swabs in vial transport medium  Result Value Ref Range Status   SARS Coronavirus 2 by RT PCR NEGATIVE NEGATIVE Final    Comment: (NOTE) SARS-CoV-2 target nucleic acids are NOT DETECTED.  The SARS-CoV-2 RNA is generally detectable in upper respiratory specimens during the acute phase of infection. The lowest concentration of SARS-CoV-2 viral copies this assay can detect is 138 copies/mL. A negative result does not preclude SARS-Cov-2 infection and should not be used as the sole basis for treatment or other patient management decisions. A negative result may occur with  improper specimen collection/handling, submission of specimen other than nasopharyngeal swab,  presence of viral mutation(s) within the areas targeted by this assay, and inadequate number of viral copies(<138 copies/mL). A negative result must be combined with clinical observations, patient history, and epidemiological information. The expected result is Negative.  Fact Sheet for Patients:  EntrepreneurPulse.com.au  Fact Sheet for Healthcare Providers:  IncredibleEmployment.be  This test is no t yet approved or cleared by the [place] FDA and  has been authorized for detection and/or diagnosis of SARS-CoV-2 by FDA under an Emergency Use Authorization (EUA). This EUA will remain  in effect (meaning this test can be used) for the duration of the COVID-19 declaration under Section 564(b)(1) of the Act, 21 U.S.C.section 360bbb-3(b)(1), unless the authorization is terminated  or revoked sooner.       Influenza A by PCR NEGATIVE NEGATIVE Final   Influenza B by PCR NEGATIVE NEGATIVE Final    Comment: (NOTE) The Xpert Xpress SARS-CoV-2/FLU/RSV plus assay is intended as an aid in the diagnosis of influenza from Nasopharyngeal swab specimens and should not be used as a sole basis for treatment. Nasal washings and aspirates are unacceptable for Xpert Xpress SARS-CoV-2/FLU/RSV testing.  Fact Sheet for Patients: EntrepreneurPulse.com.au  Fact Sheet for Healthcare Providers: IncredibleEmployment.be  This test is not yet approved or cleared by the [place] FDA and has been authorized for detection and/or diagnosis of SARS-CoV-2 by FDA under an Emergency Use Authorization (EUA). This EUA will remain in effect (meaning this test can be used) for the duration of the COVID-19 declaration under Section 564(b)(1) of the Act, 21 U.S.C. section 360bbb-3(b)(1), unless the authorization is terminated or revoked.  Performed at [facility], [address] [place], [place] [address]   Culture, blood  (x 2)     Status: None (Preliminary result)   Collection Time: 10/31/21 12:49 PM   Specimen: BLOOD  Result Value Ref Range Status   Specimen Description BLOOD  RIGHT [facility]  Final   Special Requests BOTTLES DRAWN AEROBIC AND ANAEROBIC BCLV  Final   Culture   Final    NO GROWTH 4 DAYS Performed at [facility], [address] [place], [place] [address]    Report Status PENDING  Incomplete  Culture, blood (x 2)     Status: None (Preliminary result)   Collection Time: 10/31/21 12:49 PM   Specimen: BLOOD  Result Value Ref Range Status   Specimen Description BLOOD LEFT HAND  Final   Special Requests BOTTLES DRAWN AEROBIC AND ANAEROBIC BCLV  Final   Culture   Final    NO GROWTH 4 DAYS Performed at [facility], [address], [place], [place] [address]    Report Status PENDING  Incomplete  Urine Culture     Status: Abnormal   Collection Time: 10/31/21  3:53 PM   Specimen: Urine, Random  Result Value Ref Range Status   Specimen Description   Final    URINE, RANDOM Performed  at [facility], [address] [place], [place] [address]    Special Requests   Final    NONE Performed at [facility], [place], [place] [address]    Culture >=100,000 COLONIES/mL ESCHERICHIA COLI (A)  Final   Report Status 11/03/2021 FINAL  Final   Organism ID, Bacteria ESCHERICHIA COLI (A)  Final      Susceptibility   Escherichia coli - MIC*    AMPICILLIN 8 SENSITIVE Sensitive     CEFAZOLIN <=4 SENSITIVE Sensitive     CEFEPIME <=0.12 SENSITIVE Sensitive     CEFTRIAXONE <=0.25 SENSITIVE Sensitive     CIPROFLOXACIN <=0.25 SENSITIVE Sensitive     GENTAMICIN <=1 SENSITIVE Sensitive     IMIPENEM <=0.25 SENSITIVE Sensitive     NITROFURANTOIN <=16 SENSITIVE Sensitive     TRIMETH/SULFA <=20 SENSITIVE Sensitive     AMPICILLIN/SULBACTAM 4 SENSITIVE Sensitive     PIP/TAZO <=4 SENSITIVE Sensitive     * >=100,000 COLONIES/mL ESCHERICHIA COLI    Coagulation  Studies: No results for input(s): LABPROT, INR in the last 72 hours.   Urinalysis: No results for input(s): COLORURINE, LABSPEC, PHURINE, GLUCOSEU, HGBUR, BILIRUBINUR, KETONESUR, PROTEINUR, UROBILINOGEN, NITRITE, LEUKOCYTESUR in the last 72 hours.  Invalid input(s): APPERANCEUR     Imaging: No results found.   Medications:       stroke: mapping our early stages of recovery book   Does not apply Once   aspirin EC  81 mg Oral Daily   cephALEXin  500 mg Oral Q8H   citalopram  20 mg Oral Daily   doxycycline  100 mg Oral Q12H   metoprolol succinate  12.5 mg Oral Daily   mirabegron ER  50 mg Oral Daily   sodium bicarbonate  650 mg Oral BID   [START ON 11/05/2021] vitamin B-12  1,000 mcg Oral Daily   acetaminophen **OR** acetaminophen (TYLENOL) oral liquid 160 mg/5 mL **OR** acetaminophen, albuterol, dextromethorphan-guaiFENesin, hydrALAZINE, HYDROcodone-acetaminophen, ondansetron (ZOFRAN) IV, senna-docusate  Assessment/ Plan:  [name] is a 75 y.o. 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 Chronic kidney disease secondary to hypertension and arthrosclerotic disease. Acute kidney injury secondary to rhabdomyolysis.  - Continue holding losartan - IVF stopped - Sodium bicarb twice daily - Creatinine improving - Cleared to discharge from renal stance with office follow up in 1-2 weeks   UTI  E Coli in urine 11/18. Tx with ceftriaxone iv  completed  Anemia of chronic kidney disease:  -  hgb 8.8 within range of 7.5-9.8  Cardiomyopathy: diagnosed with Takotsubo cardiomyopathy. Echocardiogram with EF of 30-35% and grade I diastolic congestive heart failure.  - Appreciate cardiology input.      Rhabdomyolysis: CK trending lower - holding atorvastatin  Pneumonia: possibly secondary to aspiration. Empiric antibiotics of ceftriaxone, metronidazole, and azithromycin.  - Continue supportive care      LOS: 4   11/22/20222:21 PM

## 2021-11-04 NOTE — Progress Notes (Signed)
Occupational Therapy Treatment Patient Details Name: Linda [name] MRN: [id number] DOB: 1946/01/07 Today's Date: 11/04/2021   History of present illness Pt is a 75 y/o F admitted on 10/31/21 after presenting from home with c/o worsening aphasia, weakness, cough & SOB. Head imaging showed no acute infarct. Pt was also found to have elevated troponin & cardiology was consulted.   PMH: stroke with aphasia, HTN, HLD, depression with anxiety, dementia, CKD IV, sCHF, brain aneurysm   OT comments  Linda [name] was seen for OT treatment on this date. Upon arrival to room pt reclined in chair with daughter at bedside. Pt requires CGA + RW for toilet t/f, noted to have x2 posterior LOBs requiring MAX A to correct (x1 stepping backwards to open door and x1 stepping back towards toilet). CGA don/doff undewear in standing. SUPERVISION perihygiene sitting. SBA + MIN cues hand washing/grooming standing sinkside. MOD I for LB access seated EOC. Pt making good progress toward goals. Pt continues to benefit from skilled OT services to maximize return to PLOF and minimize risk of future falls, injury, caregiver burden, and readmission. Will continue to follow POC. Discharge recommendation remains appropriate.     Recommendations for follow up therapy are one component of a multi-disciplinary discharge planning process, led by the attending physician.  Recommendations may be updated based on patient status, additional functional criteria and insurance authorization.    Follow Up Recommendations  Home health OT    Assistance Recommended at Discharge Frequent or constant Supervision/Assistance  Equipment Recommendations  None recommended by OT    Recommendations for Other Services      Precautions / Restrictions Precautions Precautions: Fall Precaution Comments: expressive difficulties baseline Restrictions Weight Bearing Restrictions: No       Mobility Bed Mobility               General bed  mobility comments: Pt upright in recliner upon arrival to room.    Transfers Overall transfer level: Needs assistance Equipment used: Rolling walker (2 wheels) Transfers: Sit to/from Stand Sit to Stand: Min guard           General transfer comment: x2 posterior LOBs requiring MAX A to correct     Balance Overall balance assessment: Needs assistance Sitting-balance support: Feet supported;No upper extremity supported;Bilateral upper extremity supported Sitting balance-Leahy Scale: Fair Sitting balance - Comments: supervision static sitting   Standing balance support: No upper extremity supported;During functional activity Standing balance-Leahy Scale: Fair Standing balance comment: reaching inside BOS                           ADL either performed or assessed with clinical judgement   ADL Overall ADL's : Needs assistance/impaired                                       General ADL Comments: CGA + RW for toilet t/f, x2 posterior LOBs requiring MAX A to correct. CGA don/doff undewear in standing. SUPERVISION perihygiene sitting. SBA + MIN cues hand washing/grooming standing sinkside. MOD I for LB access seated EOC.      Cognition Arousal/Alertness: Awake/alert Behavior During Therapy: WFL for tasks assessed/performed Overall Cognitive Status: History of cognitive impairments - at baseline  General Comments: communication improved, continues to require cues for multistep commands          Exercises Exercises: Other exercises Other Exercises Other Exercises: Pt and family educated re: OT role, DME recs, d/c recs, falls prevention, home/routines modifications Other Exercises: toileting, grooming, sit<>Stand x2, ~30 ft mobility, sitting/standing balance/tolerance   Shoulder Instructions       General Comments SpO2 high 90s during mobility however wheezing noted    Pertinent Vitals/ Pain        Pain Assessment: Faces Faces Pain Scale: Hurts little more Pain Location: RLE/hip/groin Pain Descriptors / Indicators: Discomfort;Dull;Grimacing Pain Intervention(s): Limited activity within patient's tolerance;Patient requesting pain meds-RN notified;Repositioned         Frequency  Min 3X/week        Progress Toward Goals  OT Goals(current goals can now be found in the care plan section)  Progress towards OT goals: Progressing toward goals  Acute Rehab OT Goals Patient Stated Goal: to go home OT Goal Formulation: With patient/family Time For Goal Achievement: 11/14/21 Potential to Achieve Goals: Good ADL Goals Pt Will Perform Grooming: with min assist;standing Pt Will Perform Lower Body Dressing: with min assist;sit to/from stand Pt Will Transfer to Toilet: with min guard assist;ambulating;bedside commode  Plan Discharge plan needs to be updated;Frequency remains appropriate    Co-evaluation                 AM-PAC OT "6 Clicks" Daily Activity     Outcome Measure   Help from another person eating meals?: A Little Help from another person taking care of personal grooming?: A Little Help from another person toileting, which includes using toliet, bedpan, or urinal?: A Little Help from another person bathing (including washing, rinsing, drying)?: A Little Help from another person to put on and taking off regular upper body clothing?: A Little Help from another person to put on and taking off regular lower body clothing?: A Little 6 Click Score: 18    End of Session Equipment Utilized During Treatment: Rolling walker (2 wheels)  OT Visit Diagnosis: Other abnormalities of gait and mobility (R26.89)   Activity Tolerance Patient tolerated treatment well   Patient Left in chair;with call bell/phone within reach;with chair alarm set;with family/visitor present   Nurse Communication Mobility status        Time: 1352-1420 OT Time Calculation (min): 28  min  Charges: OT General Charges $OT Visit: 1 Visit OT Treatments $Self Care/Home Management : 23-37 mins  Dessie Coma, M.S. OTR/L  11/04/21, 2:59 PM  ascom [phone]

## 2021-11-04 NOTE — Assessment & Plan Note (Signed)
Patient's B12 is severely low. This was checked in the response to anemia. Will provide subcutaneous B12 injections for 2 days. Will also replace other vitamins. Outpatient follow-up with PCP. Recommend injections B12 if PCP can arrange.

## 2021-11-05 DIAGNOSIS — I5181 Takotsubo syndrome: Secondary | ICD-10-CM | POA: Diagnosis not present

## 2021-11-05 LAB — CK: Total CK: 1127 U/L — ABNORMAL HIGH (ref 38–234)

## 2021-11-05 LAB — CULTURE, BLOOD (ROUTINE X 2)
Culture: NO GROWTH
Culture: NO GROWTH

## 2021-11-05 LAB — CBC WITH DIFFERENTIAL/PLATELET
Abs Immature Granulocytes: 0.15 10*3/uL — ABNORMAL HIGH (ref 0.00–0.07)
Basophils Absolute: 0.1 10*3/uL (ref 0.0–0.1)
Basophils Relative: 1 %
Eosinophils Absolute: 0.5 10*3/uL (ref 0.0–0.5)
Eosinophils Relative: 5 %
HCT: 26.8 % — ABNORMAL LOW (ref 36.0–46.0)
Hemoglobin: 8.9 g/dL — ABNORMAL LOW (ref 12.0–15.0)
Immature Granulocytes: 1 %
Lymphocytes Relative: 23 %
Lymphs Abs: 2.4 10*3/uL (ref 0.7–4.0)
MCH: 33 pg (ref 26.0–34.0)
MCHC: 33.2 g/dL (ref 30.0–36.0)
MCV: 99.3 fL (ref 80.0–100.0)
Monocytes Absolute: 0.8 10*3/uL (ref 0.1–1.0)
Monocytes Relative: 8 %
Neutro Abs: 6.5 10*3/uL (ref 1.7–7.7)
Neutrophils Relative %: 62 %
Platelets: 190 10*3/uL (ref 150–400)
RBC: 2.7 MIL/uL — ABNORMAL LOW (ref 3.87–5.11)
RDW: 14.6 % (ref 11.5–15.5)
WBC: 10.4 10*3/uL (ref 4.0–10.5)
nRBC: 0 % (ref 0.0–0.2)

## 2021-11-05 LAB — BASIC METABOLIC PANEL
Anion gap: 5 (ref 5–15)
BUN: 36 mg/dL — ABNORMAL HIGH (ref 8–23)
CO2: 18 mmol/L — ABNORMAL LOW (ref 22–32)
Calcium: 8.4 mg/dL — ABNORMAL LOW (ref 8.9–10.3)
Chloride: 113 mmol/L — ABNORMAL HIGH (ref 98–111)
Creatinine, Ser: 2.37 mg/dL — ABNORMAL HIGH (ref 0.44–1.00)
GFR, Estimated: 21 mL/min — ABNORMAL LOW (ref >60)
Glucose, Bld: 111 mg/dL — ABNORMAL HIGH (ref 70–99)
Potassium: 3.9 mmol/L (ref 3.5–5.1)
Sodium: 136 mmol/L (ref 135–145)

## 2021-11-05 MED ORDER — METOPROLOL SUCCINATE ER 25 MG PO TB24: 12.5000 mg | ORAL_TABLET | Freq: Every day | ORAL | 0 refills | Status: AC

## 2021-11-05 MED ORDER — ISOSORBIDE MONONITRATE ER 30 MG PO TB24: 30.0000 mg | ORAL_TABLET | Freq: Every day | ORAL | 0 refills | Status: AC

## 2021-11-05 MED ORDER — CYANOCOBALAMIN 1000 MCG PO TABS: 1000.0000 ug | ORAL_TABLET | Freq: Every day | ORAL | 0 refills | Status: AC

## 2021-11-05 MED ORDER — ASPIRIN 81 MG PO TBEC: 81.0000 mg | DELAYED_RELEASE_TABLET | Freq: Every day | ORAL | 11 refills | Status: AC

## 2021-11-05 MED ORDER — SODIUM BICARBONATE 650 MG PO TABS
650.0000 mg | ORAL_TABLET | Freq: Two times a day (BID) | ORAL | 0 refills | Status: AC
Start: 2021-11-05 — End: ?

## 2021-11-05 NOTE — Progress Notes (Signed)
Physical Therapy Treatment Patient Details Name: Linda [name] MRN: [id number] DOB: 1946-03-28 Today's Date: 11/05/2021   History of Present Illness Pt is a 75 y/o F admitted on 10/31/21 after presenting from home with c/o worsening aphasia, weakness, cough & SOB. Head imaging showed no acute infarct. Pt was also found to have elevated troponin & cardiology was consulted.   PMH: stroke with aphasia, HTN, HLD, depression with anxiety, dementia, CKD IV, sCHF, brain aneurysm    PT Comments    Pt was sitting EOB with supportive family member at bedside. She is eager to be DC home. Required min encouragement to participate but once willing was fully cooperative. She is extremely deconditioned however was safe with standing and ambulation 120 ft with RW. Pt has SOB noted but Sao2 stable. Heart rated in A fib ranging from 80-130s bpm. Overall pt tolerated well. Recommend continued skilled PT going forward to progress pt to PLOF while improving activity tolerance with ADLs.    Recommendations for follow up therapy are one component of a multi-disciplinary discharge planning process, led by the attending physician.  Recommendations may be updated based on patient status, additional functional criteria and insurance authorization.  Follow Up Recommendations  Home health PT     Assistance Recommended at Discharge Frequent or constant Supervision/Assistance  Equipment Recommendations  Other (comment) (per daughter, pt has all equipment needs met already.)       Precautions / Restrictions Precautions Precautions: Fall Precaution Comments: expressive difficulties baseline Restrictions Weight Bearing Restrictions: No     Mobility  Bed Mobility    General bed mobility comments: pt was seated EOB prior to session. Pt was in recliner post session.    Transfers Overall transfer level: Needs assistance Equipment used: Rolling walker (2 wheels) Transfers: Sit to/from Stand Sit to Stand: Min  guard;Supervision           General transfer comment: supervision required to stand from lowest bed height to RW    Ambulation/Gait Ambulation/Gait assistance: Min guard Gait Distance (Feet): 120 Feet Assistive device: Rolling walker (2 wheels) Gait Pattern/deviations: Decreased step length - right;Decreased step length - left;Decreased stride length Gait velocity: decreased     General Gait Details: pt is very deconditioned. Extremely fatigued form minimal physical activity.    Balance Overall balance assessment: Needs assistance Sitting-balance support: Feet supported;No upper extremity supported;Bilateral upper extremity supported Sitting balance-Leahy Scale: Fair     Standing balance support: Reliant on assistive device for balance;Bilateral upper extremity supported;During functional activity Standing balance-Leahy Scale: Fair      Cognition Arousal/Alertness: Awake/alert Behavior During Therapy: WFL for tasks assessed/performed Overall Cognitive Status: History of cognitive impairments - at baseline      General Comments: Pt is A and O but has expressive aphasia which requires increased time to respond with repetition to state               Pertinent Vitals/Pain Pain Assessment: No/denies pain Faces Pain Scale: No hurt           PT Goals (current goals can now be found in the care plan section) Acute Rehab PT Goals Patient Stated Goal: go home Progress towards PT goals: Progressing toward goals    Frequency    Min 2X/week      PT Plan Current plan remains appropriate          End of Session Equipment Utilized During Treatment: Gait belt Activity Tolerance: Patient limited by fatigue Patient left: in chair;with chair alarm set;with call  bell/phone within reach;with family/visitor present;with nursing/sitter in room Nurse Communication: Mobility status PT Visit Diagnosis: Unsteadiness on feet (R26.81);Pain;Muscle weakness (generalized)  (M62.81);Difficulty in walking, not elsewhere classified (R26.2);History of falling (Z91.81) Pain - Right/Left: Right Pain - part of body: Leg;Hip     Time: 4076-8088 PT Time Calculation (min) (ACUTE ONLY): 14 min  Charges:  $Gait Training: 8-22 mins                     Julaine Fusi PTA 11/05/21, 11:41 AM

## 2021-11-05 NOTE — Progress Notes (Signed)
Linda [name] to be D/C'd Home per MD order.  Discussed prescriptions and follow up appointments with the patient. Prescriptions given to patient, medication list explained in detail. Pt verbalized understanding.  Allergies as of 11/05/2021   No Known Allergies      Medication List     STOP taking these medications    losartan 100 MG tablet Commonly known as: COZAAR       TAKE these medications    amLODipine 2.5 MG tablet Commonly known as: NORVASC Take 2.5 mg by mouth daily.   aspirin 81 MG EC tablet Take 1 tablet (81 mg total) by mouth daily. Swallow whole. Start taking on: November 06, 2021   atorvastatin 40 MG tablet Commonly known as: LIPITOR Take 1 tablet (40 mg total) by mouth daily.   citalopram 20 MG tablet Commonly known as: CELEXA Take 20 mg by mouth daily.   clopidogrel 75 MG tablet Commonly known as: PLAVIX Take 1 tablet (75 mg total) by mouth daily.   cyanocobalamin 1000 MCG tablet Take 1 tablet (1,000 mcg total) by mouth daily. Start taking on: November 06, 2021   isosorbide mononitrate 30 MG 24 hr tablet Commonly known as: IMDUR Take 1 tablet (30 mg total) by mouth daily.   metoprolol succinate 25 MG 24 hr tablet Commonly known as: TOPROL-XL Take 0.5 tablets (12.5 mg total) by mouth daily. Start taking on: November 06, 2021   Myrbetriq 50 MG Tb24 tablet Generic drug: mirabegron ER Take 1 tablet by mouth once daily   sodium bicarbonate 650 MG tablet Take 1 tablet (650 mg total) by mouth 2 (two) times daily.   Vitamin D (Ergocalciferol) 1.25 MG (50000 UNIT) Caps capsule Commonly known as: DRISDOL Take 50,000 Units by mouth once a week.        Vitals:   11/05/21 0431 11/05/21 0814  BP: (!) 158/106 (!) 160/91  Pulse: 73 71  Resp: 19 17  Temp: 98.6 F (37 C) 98 F (36.7 C)  SpO2: 97% 99%    Tele box removed and returned.Skin clean, dry and intact without evidence of skin break down, no evidence of skin tears noted. IV catheter  discontinued intact. Site without signs and symptoms of complications. Dressing and pressure applied. Pt denies pain at this time. No complaints noted.  An After Visit Summary was printed and given to the patient. Patient escorted via [place], and D/C home via private auto.  Linda [name]

## 2021-11-05 NOTE — Progress Notes (Signed)
[facility]  ROUNDING NOTE   Subjective:   [name] was admitted to [facility] on 10/31/2021 for Aphasia [R47.01] TIA (transient ischemic attack) [G45.9] Stroke [facility]) [I63.9] NSTEMI (non-ST elevated myocardial infarction) ([place]) [I21.4] Fall, initial encounter [W19.XXXA] Altered mental status, unspecified altered mental status type [R41.82]  Patient was admitted to new onset facial droop and abnormal gait. Found to have acute coronary syndrome on admission. Patient was diagnosed with Takotsubo cardiomyopathy. Also found to have rhabdomyolysis.   Patient seen sitting up at the side of bed, alert and oriented Currently eating breakfast Denies shortness of breath, vomiting, nausea States she is ready to go home  Objective:  Vital signs in last 24 hours:  Temp:  [98 F (36.7 C)-98.7 F (37.1 C)] 98 F (36.7 C) (11/23 0814) Pulse Rate:  [70-73] 71 (11/23 0814) Resp:  [17-19] 17 (11/23 0814) BP: (130-160)/(64-106) 160/91 (11/23 0814) SpO2:  [92 %-99 %] 99 % (11/23 0814) Weight:  [75.8 kg] 75.8 kg (11/23 0431)  Weight change:  Filed Weights   10/31/21 0703 11/05/21 0431  Weight: 72.7 kg 75.8 kg    Intake/Output: I/O last 3 completed shifts: In: 951.7 [P.O.:120; I.V.:831.7] Out: 625 [Urine:625]   Intake/Output this shift:  Total I/O In: 240 [P.O.:240] Out: 200 [Urine:200]  Physical Exam: General: NAD, sitting at bedside  Head: Normocephalic, atraumatic. Moist oral mucosal membranes  Lungs:  Clear to auscultation, room air  Heart: Irregular rhythm  Abdomen:  Soft, nontender  Extremities:  trace peripheral edema.  Neurologic: Nonfocal, moving all four extremities, following commands. Right sided weakness.   Skin: No lesions        Basic Metabolic Panel: Recent Labs  Lab 10/31/21 0648 11/02/21 0451 11/03/21 0504 11/04/21 0907 11/05/21 0704  NA 136 138 137 138 136  K 5.1 4.2 3.5 3.7 3.9  CL 106 112* 115* 117* 113*  CO2 20* 17* 16* 15* 18*   GLUCOSE 118* 102* 105* 148* 111*  BUN 52* 58* 53* 42* 36*  CREATININE 2.94* 2.74* 2.66* 2.27* 2.37*  CALCIUM 9.2 8.2* 8.1* 8.1* 8.4*     Liver Function Tests: Recent Labs  Lab 10/31/21 0648  AST 59*  ALT 23  ALKPHOS 48  BILITOT 0.5  PROT 8.2*  ALBUMIN 3.9    No results for input(s): LIPASE, AMYLASE in the last 168 hours. No results for input(s): AMMONIA in the last 168 hours.  CBC: Recent Labs  Lab 10/31/21 0648 11/01/21 0606 11/03/21 0504 11/03/21 0758 11/03/21 1730 11/04/21 0907 11/05/21 0704  WBC 14.4*   < > 11.6* 11.8* 11.2* 9.5 10.4  NEUTROABS 12.4*  --   --  7.9*  --  6.0 6.5  HGB 10.5*   < > 7.5* 7.7* 8.8* 8.5* 8.9*  HCT 33.0*   < > 23.2* 23.6* 26.8* 25.9* 26.8*  MCV 99.1   < > 98.7 98.3 99.3 98.9 99.3  PLT 291   < > 226 242 204 198 190   < > = values in this interval not displayed.     Cardiac Enzymes: Recent Labs  Lab 11/01/21 0606 11/02/21 0451 11/03/21 0504 11/04/21 0308 11/05/21 0704  CKTOTAL 3,287* 3,392* 3,079* 2,075* 1,127*     BNP: Invalid input(s): POCBNP  CBG: Recent Labs  Lab 10/31/21 0622  GLUCAP 127*     Microbiology: Results for orders placed or performed during the hospital encounter of 10/31/21  Resp Panel by RT-PCR (Flu A&B, Covid) Nasopharyngeal Swab     Status: None  Collection Time: 10/31/21 12:48 AM   Specimen: Nasopharyngeal Swab; Nasopharyngeal(NP) swabs in vial transport medium  Result Value Ref Range Status   SARS Coronavirus 2 by RT PCR NEGATIVE NEGATIVE Final    Comment: (NOTE) SARS-CoV-2 target nucleic acids are NOT DETECTED.  The SARS-CoV-2 RNA is generally detectable in upper respiratory specimens during the acute phase of infection. The lowest concentration of SARS-CoV-2 viral copies this assay can detect is 138 copies/mL. A negative result does not preclude SARS-Cov-2 infection and should not be used as the sole basis for treatment or other patient management decisions. A negative result may  occur with  improper specimen collection/handling, submission of specimen other than nasopharyngeal swab, presence of viral mutation(s) within the areas targeted by this assay, and inadequate number of viral copies(<138 copies/mL). A negative result must be combined with clinical observations, patient history, and epidemiological information. The expected result is Negative.  Fact Sheet for Patients:  EntrepreneurPulse.com.au  Fact Sheet for Healthcare Providers:  IncredibleEmployment.be  This test is no t yet approved or cleared by the [place] FDA and  has been authorized for detection and/or diagnosis of SARS-CoV-2 by FDA under an Emergency Use Authorization (EUA). This EUA will remain  in effect (meaning this test can be used) for the duration of the COVID-19 declaration under Section 564(b)(1) of the Act, 21 U.S.C.section 360bbb-3(b)(1), unless the authorization is terminated  or revoked sooner.       Influenza A by PCR NEGATIVE NEGATIVE Final   Influenza B by PCR NEGATIVE NEGATIVE Final    Comment: (NOTE) The Xpert Xpress SARS-CoV-2/FLU/RSV plus assay is intended as an aid in the diagnosis of influenza from Nasopharyngeal swab specimens and should not be used as a sole basis for treatment. Nasal washings and aspirates are unacceptable for Xpert Xpress SARS-CoV-2/FLU/RSV testing.  Fact Sheet for Patients: EntrepreneurPulse.com.au  Fact Sheet for Healthcare Providers: IncredibleEmployment.be  This test is not yet approved or cleared by the [place] FDA and has been authorized for detection and/or diagnosis of SARS-CoV-2 by FDA under an Emergency Use Authorization (EUA). This EUA will remain in effect (meaning this test can be used) for the duration of the COVID-19 declaration under Section 564(b)(1) of the Act, 21 U.S.C. section 360bbb-3(b)(1), unless the authorization is terminated  or revoked.  Performed at [facility], [address] [place], [place] [address]   Culture, blood (x 2)     Status: None   Collection Time: 10/31/21 12:49 PM   Specimen: BLOOD  Result Value Ref Range Status   Specimen Description BLOOD  RIGHT [facility]  Final   Special Requests BOTTLES DRAWN AEROBIC AND ANAEROBIC BCLV  Final   Culture   Final    NO GROWTH 5 DAYS Performed at [facility], [address], [place], [place] [address]    Report Status 11/05/2021 FINAL  Final  Culture, blood (x 2)     Status: None   Collection Time: 10/31/21 12:49 PM   Specimen: BLOOD  Result Value Ref Range Status   Specimen Description BLOOD LEFT HAND  Final   Special Requests BOTTLES DRAWN AEROBIC AND ANAEROBIC BCLV  Final   Culture   Final    NO GROWTH 5 DAYS Performed at [facility], [address] [place], [place] [address]    Report Status 11/05/2021 FINAL  Final  Urine Culture     Status: Abnormal   Collection Time: 10/31/21  3:53 PM   Specimen: Urine, Random  Result Value Ref  Range Status   Specimen Description   Final    URINE, RANDOM Performed at [facility], [address] [place], [place] [address]    Special Requests   Final    NONE Performed at [facility], [place], [place] [address]    Culture >=100,000 COLONIES/mL ESCHERICHIA COLI (A)  Final   Report Status 11/03/2021 FINAL  Final   Organism ID, Bacteria ESCHERICHIA COLI (A)  Final      Susceptibility   Escherichia coli - MIC*    AMPICILLIN 8 SENSITIVE Sensitive     CEFAZOLIN <=4 SENSITIVE Sensitive     CEFEPIME <=0.12 SENSITIVE Sensitive     CEFTRIAXONE <=0.25 SENSITIVE Sensitive     CIPROFLOXACIN <=0.25 SENSITIVE Sensitive     GENTAMICIN <=1 SENSITIVE Sensitive     IMIPENEM <=0.25 SENSITIVE Sensitive     NITROFURANTOIN <=16 SENSITIVE Sensitive     TRIMETH/SULFA <=20 SENSITIVE Sensitive     AMPICILLIN/SULBACTAM 4 SENSITIVE Sensitive     PIP/TAZO <=4  SENSITIVE Sensitive     * >=100,000 COLONIES/mL ESCHERICHIA COLI    Coagulation Studies: No results for input(s): LABPROT, INR in the last 72 hours.   Urinalysis: No results for input(s): COLORURINE, LABSPEC, PHURINE, GLUCOSEU, HGBUR, BILIRUBINUR, KETONESUR, PROTEINUR, UROBILINOGEN, NITRITE, LEUKOCYTESUR in the last 72 hours.  Invalid input(s): APPERANCEUR     Imaging: No results found.   Medications:       stroke: mapping our early stages of recovery book   Does not apply Once   aspirin EC  81 mg Oral Daily   citalopram  20 mg Oral Daily   clopidogrel  75 mg Oral Daily   metoprolol succinate  12.5 mg Oral Daily   mirabegron ER  50 mg Oral Daily   sodium bicarbonate  650 mg Oral BID   vitamin B-12  1,000 mcg Oral Daily   acetaminophen **OR** acetaminophen (TYLENOL) oral liquid 160 mg/5 mL **OR** acetaminophen, albuterol, dextromethorphan-guaiFENesin, hydrALAZINE, HYDROcodone-acetaminophen, ondansetron (ZOFRAN) IV, senna-docusate  Assessment/ Plan:  [name] is a 75 y.o. 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 Chronic kidney disease secondary to hypertension and arthrosclerotic disease. Acute kidney injury secondary to rhabdomyolysis.  - Continue holding losartan - Sodium bicarb twice daily - Creatinine at baseline -Cleared for discharge from renal stance, will schedule 1 to 2-week follow-up appointment with nephrology   UTI  E Coli in  urine 11/18. Tx with ceftriaxone iv completed  Anemia of chronic kidney disease:  -  hgb 8.9 remains within range of 7.5-9.8  Cardiomyopathy: diagnosed with Takotsubo cardiomyopathy. Echocardiogram with EF of 30-35% and grade I diastolic congestive heart failure.  - Appreciate cardiology input.      Rhabdomyolysis: CK trending lower - holding atorvastatin  Pneumonia: possibly secondary to aspiration. Empiric antibiotics of ceftriaxone, metronidazole, and azithromycin.  - Continue supportive care      LOS: 5   11/23/20221:29 PM

## 2021-11-09 NOTE — Discharge Summary (Signed)
Physician Discharge Summary   Patient name: Linda [name]  Admit date:     10/31/2021  Discharge date: 11/05/2021   Discharge Physician: Berle Mull   PCP: Sofie Hartigan, MD   Recommendations at discharge: follow up with PCP as recommended   Discharge Diagnoses Principal Problem:   Takotsubo cardiomyopathy Active Problems:   Atypical pneumonia   Acute respiratory failure with hypoxia (HCC)   Chronic systolic CHF (congestive heart failure) (HCC)   TIA (transient ischemic attack)   Elevated troponin   Sepsis ([place])   CKD (chronic kidney disease), stage IV ([place])   Rhabdomyolysis   Severe B12 deficiency, B12 level 91   Hyperlipidemia   Essential hypertension   Depression with anxiety   Other specified anemias   Resolved Diagnoses Resolved Problems:   Stroke ([place])   E. coli UTI (urinary tract infection)   Hospital Course   75 y.o. female with medical history significant of stroke with aphasia, hypertension, hyperlipidemia, depression with anxiety, dementia, CKD-IV, sCHF, brain aneurysm, former smoker, who presents with worsening facial and difficulty walking.   11/19: Negative stroke work-up and her speech is back to baseline per family.  Her right side weakness is resolved. Cardiology recommends 48 hours of heparin infusion with uncertainty of possible MI versus Takotsubo cardiomyopathy.  Worsening CK.  Nephrology consultation 11/20: CK still trending up.  Nephrology seen, cardiology not planning for cath considering renal dysfunction 11/21: 1 PRBC ordered. 11/22, B12 severely low.  Initiating injection B12.  H&H remained stable.  FOBT positive.   * Takotsubo cardiomyopathy Elevated troponin.  Echo showing akinetic anteroseptal wall and apex.  Troponin peaked at 6500 and now trending down.  Patient is not having any chest pain.  Cardiology recommends conservative management with heparin infusion 48 hours.  Continue low-dose metoprolol.  Due to her renal dysfunction  ischemic evaluation will be deferred while here in the hospital per cardiology.  Cannot give statin due to elevated CK/rhabdo.    Acute respiratory failure with hypoxia (HCC) Oxygenation worsening to 84% on room air in ER.  Placed on 4 L of oxygen.  Currently resolving.  On room air.  Atypical pneumonia Treated with Rocephin, Flagyl and Zithromax. We will switch to doxycycline and Keflex.  Completed antibiotic treatment course in the hospital.  E. coli UTI (urinary tract infection)-resolved as of 11/03/2021 On Rocephin.  Cultures growing pansensitive E. coli. Will transition to Keflex.  Elevated troponin Cardiology consulted.  Manage conservatively.  Was given IV heparin and on aspirin and Plavix.  Plavix will be resumed.  TIA (transient ischemic attack) Symptoms resolved.  Continue aspirin and statin.  Added Plavix. Which is currently on hold due to concern with anemia. Stroke work-up essentially negative including MRI of the brain, carotid Dopplers and echo.  Chronic systolic CHF (congestive heart failure) (HCC) Appears to be euvolemic. Echo shows EF of 30 to 35%.  Continue metoprolol, aspirin.  Treated with heparin infusion for 48 hours.   Unable to restart losartan due to renal dysfunction.  Cannot give Lipitor due to rhabdo.   CKD (chronic kidney disease), stage IV (HCC) Baseline creatinine around 1.6-1.9.  Admission creatinine of 2.94> 2.74  Was given IV hydration. nephrology consulted and seen  Sepsis ([place]) Met SIRS criteria on admission with organ damage with AKI and hypoxia. Due to atypical pneumonia and or possible aspiration.  Normal lactic acid.Sepsis physiology resolving.  Severe B12 deficiency, B12 level 91 Patient's B12 is severely low. This was checked in the response to anemia.  Will provide subcutaneous B12 injections for 2 days. Will also replace other vitamins. Outpatient follow-up with PCP. Recommend injections B12 if PCP can  arrange.   Rhabdomyolysis Nontraumatic.  Monitor.  Hold statin.   Other specified anemias Present on admission.  Hemoglobin 10.5 at the time of admission.  Progressively worsening to hemoglobin of 7.5 right now.  We transfuse 1 PRBC in the setting of elevated troponin and concern for Takotsubo.   H&H remained stable. FOBT was positive, hemorrhoids?.  This appears to be multifactorial.  Depression with anxiety Continue Celexa.  Essential hypertension Continue metoprolol, as needed hydralazine.  Hyperlipidemia Lipitor on hold due to elevated CK/rhabdo.     Procedures performed: none   Condition at discharge: good  Exam General: Appear in mild distress, no Rash; Oral Mucosa Clear, moist. no Abnormal Neck Mass Or lumps, Conjunctiva normal  Cardiovascular: S1 and S2 Present, no Murmur, Respiratory: good respiratory effort, Bilateral Air entry present and CTA, no Crackles, no wheezes Abdomen: Bowel Sound present, Soft and no tenderness Extremities: no Pedal edema Neurology: alert and oriented to time, place, and person affect appropriate. no new focal deficit Gait not checked due to patient safety concerns     Disposition: Home  Discharge time: greater than 30 minutes.  Follow-up Information     Andrez Grime, MD Follow up on 11/17/2021.   Specialty: Cardiology Why: @ 2:15pm Contact information: [place] [address] [phone]         Sofie Hartigan, MD. Schedule an appointment as soon as possible for a visit on 11/10/2021.   Specialty: Family Medicine Why: with CBC and BMP.Marland Kitchen@ 11:30am Patient will see the PA. Contact information: [place] [address] [phone]                 Allergies as of 11/05/2021   No Known Allergies      Medication List     STOP taking these medications    losartan 100 MG tablet Commonly known as: COZAAR       TAKE these medications    amLODipine 2.5 MG  tablet Commonly known as: NORVASC Take 2.5 mg by mouth daily.   aspirin 81 MG EC tablet Take 1 tablet (81 mg total) by mouth daily. Swallow whole.   atorvastatin 40 MG tablet Commonly known as: LIPITOR Take 1 tablet (40 mg total) by mouth daily.   citalopram 20 MG tablet Commonly known as: CELEXA Take 20 mg by mouth daily.   clopidogrel 75 MG tablet Commonly known as: PLAVIX Take 1 tablet (75 mg total) by mouth daily.   cyanocobalamin 1000 MCG tablet Take 1 tablet (1,000 mcg total) by mouth daily.   isosorbide mononitrate 30 MG 24 hr tablet Commonly known as: IMDUR Take 1 tablet (30 mg total) by mouth daily.   metoprolol succinate 25 MG 24 hr tablet Commonly known as: TOPROL-XL Take 0.5 tablets (12.5 mg total) by mouth daily.   Myrbetriq 50 MG Tb24 tablet Generic drug: mirabegron ER Take 1 tablet by mouth once daily   sodium bicarbonate 650 MG tablet Take 1 tablet (650 mg total) by mouth 2 (two) times daily.   Vitamin D (Ergocalciferol) 1.25 MG (50000 UNIT) Caps capsule Commonly known as: DRISDOL Take 50,000 Units by mouth once a week.        MR ANGIO HEAD WO CONTRAST  Result Date: 10/31/2021 CLINICAL DATA:  Neuro deficit, acute, stroke suspected EXAM: MRI HEAD WITHOUT CONTRAST MRA HEAD WITHOUT CONTRAST  TECHNIQUE: Multiplanar, multi-echo pulse sequences of the brain and surrounding structures were acquired without intravenous contrast. Angiographic images of the Circle of Willis were acquired using MRA technique without intravenous contrast. COMPARISON:  10/15/2021 FINDINGS: MRI HEAD FINDINGS Brain: No acute infarction or hemorrhage. Encephalomalacia and gliosis in the left cerebral hemisphere with involvement of frontal, parietal, temporal, and occipital lobes. Associated chronic blood products particularly in the temporal lobe. Ex vacuo dilatation of the left lateral ventricle. Chronic small vessel infarcts of the central gray nuclei, central white matter, pons, and  cerebellum. Few additional scattered foci of susceptibility likely reflecting chronic microhemorrhages. Other stable confluent areas of T2 hyperintensity in the supratentorial and pontine white matter are nonspecific but probably reflect advanced chronic microvascular ischemic changes. Prominence of the ventricles and sulci reflects parenchymal volume loss. Vascular: Susceptibility artifact related to left MCA aneurysm coiling. Skull and upper cervical spine: Marrow signal is within normal limits. Sinuses/Orbits: Chronic left sphenoid sinusitis. Bilateral lens replacements. Other: Suprasellar mass along pituitary infundibulum is not well evaluated on this study. Persistent bilateral mastoid effusions. MRA HEAD FINDINGS Anterior circulation: Intracranial internal carotid arteries are patent with atherosclerotic irregularity. Anterior cerebral arteries are patent. Middle cerebral arteries are patent. There is susceptibility artifact at site of left MCA bifurcation aneurysm coiling. No evidence of recurrence. Posterior circulation: Intracranial vertebral arteries are patent. The right vertebral artery terminates as a PICA. Basilar artery is patent. Posterior cerebral arteries are patent. Bilateral posterior communicating arteries are present. Fetal origin of the left PCA. IMPRESSION: No acute infarction or other acute abnormality. Stable chronic findings compared to recent prior study. No significant vascular abnormality. 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 Electronically Signed   By: Macy Mis M.D.   On: 10/31/2021 12:42   MR BRAIN WO CONTRAST  Result Date: 10/31/2021 CLINICAL DATA:  Neuro deficit, acute, stroke suspected EXAM: MRI HEAD WITHOUT CONTRAST MRA HEAD WITHOUT CONTRAST TECHNIQUE: Multiplanar, multi-echo pulse sequences of the brain and surrounding structures were acquired without intravenous contrast. Angiographic images of the Circle of Willis were  acquired using MRA technique without intravenous contrast. COMPARISON:  10/15/2021 FINDINGS: MRI HEAD FINDINGS Brain: No acute infarction or hemorrhage. Encephalomalacia and gliosis in the left cerebral hemisphere with involvement of frontal, parietal, temporal, and occipital lobes. Associated chronic blood products particularly in the temporal lobe. Ex vacuo dilatation of the left lateral ventricle. Chronic small vessel infarcts of the central gray nuclei, central white matter, pons, and cerebellum. Few additional scattered foci of susceptibility likely reflecting chronic microhemorrhages. Other stable confluent areas of T2 hyperintensity in the supratentorial and pontine white matter are nonspecific but probably reflect advanced chronic microvascular ischemic changes. Prominence of the ventricles and sulci reflects parenchymal volume loss. Vascular: Susceptibility artifact related to left MCA aneurysm coiling. Skull and upper cervical spine: Marrow signal is within normal limits. Sinuses/Orbits: Chronic left sphenoid sinusitis. Bilateral lens replacements. Other: Suprasellar mass along pituitary infundibulum is not well evaluated on this study. Persistent bilateral mastoid effusions. MRA HEAD FINDINGS Anterior circulation: Intracranial internal carotid arteries are patent with atherosclerotic irregularity. Anterior cerebral arteries are patent. Middle cerebral arteries are patent. There is susceptibility artifact at site of left MCA bifurcation aneurysm coiling. No evidence of recurrence. Posterior circulation: Intracranial vertebral arteries are patent. The right vertebral artery terminates as a PICA. Basilar artery is patent. Posterior cerebral arteries are patent. Bilateral posterior communicating arteries are present. Fetal origin of the left PCA. IMPRESSION: No acute infarction or other acute abnormality. Stable  chronic findings compared to recent prior study. No significant vascular abnormality. Post coiling  of a left MCA bifurcation aneurysm without evidence of recurrence. Electronically Signed   By: Macy Mis M.D.   On: 10/31/2021 12:42   MR BRAIN W WO CONTRAST  Result Date: 10/15/2021 CLINICAL DATA:  Follow-up suprasellar mass. EXAM: MRI HEAD WITHOUT AND WITH CONTRAST TECHNIQUE: Multiplanar, multiecho pulse sequences of the brain and surrounding structures were obtained without and with intravenous contrast. 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 This is in the midline and is in the area of the infundibulum. This is just above the pituitary but appears separate from the pituitary which otherwise appears normal. Generalized atrophy with ventricular enlargement which is similar to the prior study. There is encephalomalacia and chronic infarction in the left frontal and temporal lobe. Chronic blood products are present in the left temporal infarct. Prior aneurysm coiling in the left MCA. Advanced chronic microvascular ischemic changes in the white matter. Negative for acute infarct. Vascular: Normal arterial flow voids. Aneurysm coiling left MCA bifurcation region. Skull and upper cervical spine: No focal lesion. Sinuses/Orbits: Mucosal edema in the paranasal sinuses. Bilateral mastoid effusion. 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 This is just above the pituitary and appears separate from the pituitary. Extensive atrophy. Extensive chronic microvascular ischemic change. Chronic left MCA infarct. Left MCA bifurcation aneurysm coiling. Electronically Signed   By: Franchot Gallo M.D.   On: 10/15/2021 16:05   US Carotid Bilateral (at [facility] and [facility] only)  Result Date: 10/31/2021 CLINICAL DATA:  Stroke, hyperlipidemia, former smoker EXAM: BILATERAL CAROTID DUPLEX ULTRASOUND  TECHNIQUE: Pearline Cables scale imaging, color Doppler and duplex ultrasound were performed of bilateral carotid and vertebral arteries in the neck. COMPARISON:  None. FINDINGS: Criteria: Quantification of carotid stenosis is based on velocity parameters that correlate the residual internal carotid diameter with NASCET-based stenosis levels, using the diameter of the distal internal carotid lumen as the denominator for stenosis measurement. The following velocity measurements were obtained: RIGHT ICA: 71/27 cm/sec CCA: 83/33 cm/sec SYSTOLIC ICA/CCA RATIO:  1.0 ECA: 47 cm/sec LEFT ICA: 127/36 cm/sec CCA: 83/29 cm/sec SYSTOLIC ICA/CCA RATIO:  2.8 ECA: 46 cm/sec RIGHT CAROTID ARTERY: Moderate mixed echogenicity carotid bifurcation atherosclerosis. Despite this, no significant ICA stenosis, velocity elevation, turbulent flow. Degree of narrowing less than 50% by ultrasound criteria. RIGHT VERTEBRAL ARTERY:  Normal antegrade flow LEFT CAROTID ARTERY: Similar moderate mixed echogenicity irregular bifurcation atherosclerosis. Negative for significant stenosis, velocity elevation, turbulent flow. Degree of narrowing also less than 50% by ultrasound criteria. LEFT VERTEBRAL ARTERY:  Normal antegrade flow IMPRESSION: Bilateral carotid atherosclerosis. Negative for significant ICA stenosis. Degree of narrowing less than 50% bilaterally by ultrasound criteria. Patent antegrade vertebral flow bilaterally Electronically Signed   By: Jerilynn Mages.  Shick M.D.   On: 10/31/2021 14:25   DG Chest Portable 1 View  Result Date: 10/31/2021 CLINICAL DATA:  75 year old female code stroke presentation. Cough for 1 week. EXAM: PORTABLE CHEST 1 VIEW COMPARISON:  Chest radiographs 03/17/2012. FINDINGS: Portable AP upright view at 0652 hours. Chronic large lung volumes. Mild cardiomegaly appears new since 2013. Other mediastinal contours are within normal limits. Visualized tracheal air column is within normal limits. Chronic but increased and coarse bilateral  pulmonary interstitial opacity, fairly symmetric and with some basilar predominance. No pneumothorax, pleural effusion or confluent opacity.  No acute osseous abnormality identified. Negative visible bowel gas. IMPRESSION: Chronic hyperinflation with acute on chronic bilateral pulmonary interstitial opacity. Top differential considerations include viral/atypical respiratory infection, pulmonary edema, progressed chronic lung disease. Mild cardiomegaly.  No pleural effusion. Electronically Signed   By: Genevie Ann M.D.   On: 10/31/2021 07:14   ECHOCARDIOGRAM COMPLETE  Result Date: 10/31/2021    ECHOCARDIOGRAM REPORT   Patient Name:   LINDEE LEASON Date of Exam: 10/31/2021 Medical Rec #:  [id number]      Height:       66.0 in Accession #:    [id number]     Weight:       160.3 lb Date of Birth:  [birth year]      BSA:          1.820 m Patient Age:    14 years       BP:           145/87 mmHg Patient Gender: F              HR:           85 bpm. Exam Location:  [facility] Procedure: 2D Echo, Cardiac Doppler and Color Doppler Indications:     Stroke I63.9  History:         Patient has no prior history of Echocardiogram examinations.                  Stroke; Risk Factors:Dyslipidemia. CKD.  Sonographer:     Sherrie Sport Referring Phys:  Unknown Foley NIU Diagnosing Phys: Donnelly Angelica  Sonographer Comments: Suboptimal apical window. IMPRESSIONS  1. Left ventricular ejection fraction, by estimation, is 30 to 35%. The left ventricle has moderately decreased function. The left ventricle demonstrates regional wall motion abnormalities (see scoring diagram/findings for description). Left ventricular  diastolic parameters are consistent with Grade I diastolic dysfunction (impaired relaxation).  2. Right ventricular systolic function is normal. The right ventricular size is normal.  3. The mitral valve is normal in structure. Mild mitral valve regurgitation. No evidence of mitral stenosis.  4. The aortic valve was not well visualized. Aortic  valve regurgitation is not visualized. No aortic stenosis is present. 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 FINDINGS  Left Ventricle: Left ventricular ejection fraction, by estimation, is 30 to 35%. The left ventricle has moderately decreased function. The left ventricle demonstrates regional wall motion abnormalities. The left ventricular internal cavity size was normal in size. There is no left ventricular hypertrophy. Left ventricular diastolic parameters are consistent with Grade I diastolic dysfunction (impaired relaxation).  LV Wall Scoring: The entire septum is akinetic. The basal anterior segment is hypokinetic. Right Ventricle: The right ventricular size is normal. No increase in right ventricular wall thickness. Right ventricular systolic function is normal. Left Atrium: Left atrial size was normal in size. Right Atrium: Right atrial size was normal in size. Pericardium: There is no evidence of pericardial effusion. Mitral Valve: The mitral valve is normal in structure. Mild mitral valve regurgitation. No evidence of mitral valve stenosis. MV peak gradient, 5.9 mmHg. The mean mitral valve gradient is 2.0 mmHg. Tricuspid Valve: The tricuspid valve is normal in structure. Tricuspid valve regurgitation is mild. Aortic Valve: The aortic valve was not well visualized. Aortic valve regurgitation is not visualized. No aortic stenosis is present. Aortic valve mean gradient measures 1.5 mmHg. Aortic valve peak gradient measures 3.3 mmHg. Aortic valve area, by VTI measures 4.42 cm. Pulmonic Valve: The pulmonic valve was  not well visualized. Pulmonic valve regurgitation is not visualized. Aorta: The aortic root was not well visualized. IAS/Shunts: The interatrial septum was not assessed.  LEFT VENTRICLE PLAX 2D LVIDd:         4.00 cm     Diastology LVIDs:         3.40 cm     LV e' medial:    3.92 cm/s LV PW:         1.20 cm     LV E/e' medial:  12.6  LV IVS:        0.80 cm     LV e' lateral:   5.00 cm/s LVOT diam:     2.10 cm     LV E/e' lateral: 9.9 LV SV:         63 LV SV Index:   35 LVOT Area:     3.46 cm  LV Volumes (MOD) LV vol d, MOD A2C: 84.5 ml LV vol d, MOD A4C: 64.1 ml LV vol s, MOD A2C: 71.1 ml LV vol s, MOD A4C: 34.9 ml LV SV MOD A2C:     13.4 ml LV SV MOD A4C:     64.1 ml LV SV MOD BP:      24.6 ml RIGHT VENTRICLE RV Basal diam:  3.50 cm RV S prime:     10.30 cm/s TAPSE (M-mode): 3.4 cm LEFT ATRIUM             Index        RIGHT ATRIUM           Index LA diam:        3.00 cm 1.65 cm/m   RA Area:     10.40 cm LA Vol (A2C):   48.2 ml 26.48 ml/m  RA Volume:   21.10 ml  11.59 ml/m LA Vol (A4C):   67.4 ml 37.03 ml/m LA Biplane Vol: 62.3 ml 34.23 ml/m  AORTIC VALVE                    PULMONIC VALVE AV Area (Vmax):    3.78 cm     PV Vmax:        0.59 m/s AV Area (Vmean):   3.81 cm     PV Vmean:       38.300 cm/s AV Area (VTI):     4.42 cm     PV VTI:         0.083 m AV Vmax:           90.35 cm/s   PV Peak grad:   1.4 mmHg AV Vmean:          59.950 cm/s  PV Mean grad:   1.0 mmHg AV VTI:            0.144 m      RVOT Peak grad: 2 mmHg AV Peak Grad:      3.3 mmHg AV Mean Grad:      1.5 mmHg LVOT Vmax:         98.60 cm/s LVOT Vmean:        66.000 cm/s LVOT VTI:          0.183 m LVOT/AV VTI ratio: 1.28  AORTA Ao Root diam: 2.70 cm MITRAL VALVE               TRICUSPID VALVE MV Area (PHT): 5.34 cm    TR Peak grad:   43.8 mmHg MV Area VTI:   3.88 cm  TR Vmax:        331.00 cm/s MV Peak grad:  5.9 mmHg MV Mean grad:  2.0 mmHg    SHUNTS MV Vmax:       1.21 m/s    Systemic VTI:  0.18 m MV Vmean:      70.4 cm/s   Systemic Diam: 2.10 cm MV Decel Time: 142 msec    Pulmonic VTI:  0.116 m MV E velocity: 49.30 cm/s MV A velocity: 84.80 cm/s MV E/A ratio:  0.58 Donnelly Angelica Electronically signed by Donnelly Angelica Signature Date/Time: 10/31/2021/3:14:43 PM    Final    DG HIP UNILAT WITH PELVIS 2-3 VIEWS RIGHT  Result Date: 11/02/2021 CLINICAL DATA:  Right hip  pain of unknown origin.  Dementia. EXAM: DG HIP (WITH OR WITHOUT PELVIS) 2-3V RIGHT COMPARISON:  None. FINDINGS: Exam demonstrates diffuse decreased bone mineralization. There mild symmetric degenerative changes of the hips. No evidence of acute fracture or dislocation. No focal bony abnormality over the right hip. Degenerative changes of the spine and sacroiliac joints. IMPRESSION: 1. No acute findings. 2. Mild symmetric degenerative change of the hips. Electronically Signed   By: Marin Olp M.D.   On: 11/02/2021 08:45   CT HEAD CODE STROKE WO CONTRAST  Result Date: 10/31/2021 CLINICAL DATA:  Code stroke.  76 year old female with aphasia. EXAM: CT HEAD WITHOUT CONTRAST TECHNIQUE: Contiguous axial images were obtained from the base of the skull through the vertex without intravenous contrast. COMPARISON:  Recent brain MRI 10/15/2021.  Head CT 06/27/2020. 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 Fairly extensive chronic encephalomalacia in the left hemisphere involving both the MCA and PCA territories. Superimposed advanced bilateral cerebral white matter hypodensity. Small area of chronic encephalomalacia in the anterior right MCA territory. Advanced small vessel disease throughout the bilateral deep gray nuclei. And chronic small vessel disease also in the brainstem and bilateral cerebellar hemispheres. Ex vacuo ventricular enlargement is stable from earlier this month. No superimposed midline shift, mass effect, intracranial hemorrhage or evidence of cortically based acute infarction. 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 Vascular: Calcified atherosclerosis at the skull base. 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 No suspicious intracranial vascular hyperdensity. Skull: No acute osseous abnormality identified. Sinuses/Orbits: Chronic sinusitis with scattered mucoperiosteal thickening has not  significantly changed from last year. Mild right mastoid effusion not significantly changed. Other: No acute orbit or scalp soft tissue finding. ASPECTS [facility] Stroke Program Early CT Score) Total score (0-10 with 10 being normal): 10 (chronic encephalomalacia) IMPRESSION: 1. Severe chronic ischemic disease. No acute cortically based infarct or acute intracranial hemorrhage identified. 2. Previous left MCA aneurysm coil embolization. Study discussed by telephone with Dr. Pryor Curia on 10/31/2021 at 06:36 . Electronically Signed   By: Genevie Ann M.D.   On: 10/31/2021 06:37   Results for orders placed or performed during the hospital encounter of 10/31/21  Resp Panel by RT-PCR (Flu A&B, Covid) Nasopharyngeal Swab     Status: None   Collection Time: 10/31/21 12:48 AM   Specimen: Nasopharyngeal Swab; Nasopharyngeal(NP) swabs in vial transport medium  Result Value Ref Range Status   SARS Coronavirus 2 by RT PCR NEGATIVE NEGATIVE Final    Comment: (NOTE) SARS-CoV-2 target nucleic acids are NOT DETECTED.  The SARS-CoV-2 RNA is generally detectable in upper respiratory specimens during the acute phase of infection. The lowest concentration of SARS-CoV-2 viral copies this assay can detect is 138 copies/mL. A negative  result does not preclude SARS-Cov-2 infection and should not be used as the sole basis for treatment or other patient management decisions. A negative result may occur with  improper specimen collection/handling, submission of specimen other than nasopharyngeal swab, presence of viral mutation(s) within the areas targeted by this assay, and inadequate number of viral copies(<138 copies/mL). A negative result must be combined with clinical observations, patient history, and epidemiological information. The expected result is Negative.  Fact Sheet for Patients:  EntrepreneurPulse.com.au  Fact Sheet for Healthcare Providers:   IncredibleEmployment.be  This test is no t yet approved or cleared by the [place] FDA and  has been authorized for detection and/or diagnosis of SARS-CoV-2 by FDA under an Emergency Use Authorization (EUA). This EUA will remain  in effect (meaning this test can be used) for the duration of the COVID-19 declaration under Section 564(b)(1) of the Act, 21 U.S.C.section 360bbb-3(b)(1), unless the authorization is terminated  or revoked sooner.       Influenza A by PCR NEGATIVE NEGATIVE Final   Influenza B by PCR NEGATIVE NEGATIVE Final    Comment: (NOTE) The Xpert Xpress SARS-CoV-2/FLU/RSV plus assay is intended as an aid in the diagnosis of influenza from Nasopharyngeal swab specimens and should not be used as a sole basis for treatment. Nasal washings and aspirates are unacceptable for Xpert Xpress SARS-CoV-2/FLU/RSV testing.  Fact Sheet for Patients: EntrepreneurPulse.com.au  Fact Sheet for Healthcare Providers: IncredibleEmployment.be  This test is not yet approved or cleared by the [place] FDA and has been authorized for detection and/or diagnosis of SARS-CoV-2 by FDA under an Emergency Use Authorization (EUA). This EUA will remain in effect (meaning this test can be used) for the duration of the COVID-19 declaration under Section 564(b)(1) of the Act, 21 U.S.C. section 360bbb-3(b)(1), unless the authorization is terminated or revoked.  Performed at [facility], [address] [place], [place] [address]   Culture, blood (x 2)     Status: None   Collection Time: 10/31/21 12:49 PM   Specimen: BLOOD  Result Value Ref Range Status   Specimen Description BLOOD  RIGHT [facility]  Final   Special Requests BOTTLES DRAWN AEROBIC AND ANAEROBIC BCLV  Final   Culture   Final    NO GROWTH 5 DAYS Performed at [facility], [address], [place], [place] [address]    Report Status 11/05/2021 FINAL   Final  Culture, blood (x 2)     Status: None   Collection Time: 10/31/21 12:49 PM   Specimen: BLOOD  Result Value Ref Range Status   Specimen Description BLOOD LEFT HAND  Final   Special Requests BOTTLES DRAWN AEROBIC AND ANAEROBIC BCLV  Final   Culture   Final    NO GROWTH 5 DAYS Performed at [facility], [address] [place], [place] [address]    Report Status 11/05/2021 FINAL  Final  Urine Culture     Status: Abnormal   Collection Time: 10/31/21  3:53 PM   Specimen: Urine, Random  Result Value Ref Range Status   Specimen Description   Final    URINE, RANDOM Performed at [facility], [address], [place], [place] [address]    Special Requests   Final    NONE Performed at [facility], [address] [place], [place] [address]    Culture >=100,000 COLONIES/mL ESCHERICHIA COLI (A)  Final   Report Status 11/03/2021 FINAL  Final   Organism ID, Bacteria ESCHERICHIA COLI (A)  Final      Susceptibility   Escherichia coli - MIC*    AMPICILLIN 8 SENSITIVE Sensitive     CEFAZOLIN <=4 SENSITIVE Sensitive     CEFEPIME <=0.12 SENSITIVE Sensitive     CEFTRIAXONE <=0.25 SENSITIVE Sensitive     CIPROFLOXACIN <=0.25 SENSITIVE Sensitive     GENTAMICIN <=1 SENSITIVE Sensitive     IMIPENEM <=0.25 SENSITIVE Sensitive     NITROFURANTOIN <=16 SENSITIVE Sensitive     TRIMETH/SULFA <=20 SENSITIVE Sensitive     AMPICILLIN/SULBACTAM 4 SENSITIVE Sensitive     PIP/TAZO <=4 SENSITIVE Sensitive     * >=100,000 COLONIES/mL ESCHERICHIA COLI    Signed:  Berle Mull MD.  [facility] 11/05/2021 , 7:40 AM

## 2021-11-18 ENCOUNTER — Ambulatory Visit (INDEPENDENT_AMBULATORY_CARE_PROVIDER_SITE_OTHER): Payer: Medicare Other

## 2021-11-18 ENCOUNTER — Other Ambulatory Visit: Payer: Self-pay

## 2021-11-18 DIAGNOSIS — N184 Chronic kidney disease, stage 4 (severe): Secondary | ICD-10-CM | POA: Diagnosis not present

## 2021-11-18 DIAGNOSIS — I10 Essential (primary) hypertension: Secondary | ICD-10-CM

## 2021-12-05 ENCOUNTER — Other Ambulatory Visit: Payer: Self-pay

## 2022-02-24 ENCOUNTER — Encounter: Payer: Self-pay | Admitting: Urology

## 2022-02-24 ENCOUNTER — Other Ambulatory Visit: Payer: Self-pay

## 2022-02-24 ENCOUNTER — Ambulatory Visit: Payer: Medicare Other | Admitting: Urology

## 2022-02-24 VITALS — BP 129/74 | HR 60 | Ht 66.0 in | Wt 142.0 lb

## 2022-02-24 DIAGNOSIS — R351 Nocturia: Secondary | ICD-10-CM

## 2022-02-24 DIAGNOSIS — N3281 Overactive bladder: Secondary | ICD-10-CM

## 2022-02-24 LAB — BLADDER SCAN AMB NON-IMAGING

## 2022-02-24 MED ORDER — MIRABEGRON ER 50 MG PO TB24: 50.0000 mg | ORAL_TABLET | Freq: Every day | ORAL | 11 refills | Status: DC

## 2022-02-24 NOTE — Progress Notes (Signed)
? ?  02/24/2022 ?11:10 AM  ? ?Linda [name] ?01-21-1946 ?[id number] ? ?Reason for visit: OAB, nocturia, UTI ? ?HPI: ?Comorbid 76 year old female with history notable for vascular dementia, stroke, and CKD.  Primary complaint has been overactive bladder and nocturia, and she is on Myrbetriq 50 mg daily which has made a moderate improvement in her urinary urgency, frequency, nocturia.  Per the chart there is also extensive history of sleep apnea, but her husband has continued to refute this diagnosis. ? ?Renal ultrasound with nephrology in September 2022 shows no hydronephrosis. ? ?She was hospitalized in November 2022 for worsening confusion, difficulty walking, and aphasia.  CT/MRI showed severe chronic ischemic disease, but no new acute infarction.  She denied any UTI symptoms at that visit, and urinalysis showed 6-10 WBCs and many bacteria, and culture ultimately grew E. coli and she was treated for possible UTI.  Unclear if this represents true UTI versus confusion. ? ?The history today is obtained entirely from her daughter.  She continues to have some incontinence overnight, but does pretty well during the day on the Myrbetriq.  She takes the Myrbetriq first thing in the morning. PVR today is normal at 79 mL. ? ?I recommended continuing the Myrbetriq, and we reviewed behavioral strategies including minimizing fluids prior to bedtime, and avoiding bladder irritants.  She could consider trying the Myrbetriq in the evening to see if this helps with the nocturia.  We reviewed her overall frailty and history of stroke likely contributing to her OAB symptoms, and I think we need to have realistic expectations moving forward. ? ?Myrbetriq 50 mg refilled ?Behavioral strategies discussed ?RTC yearly ? ?Billey Co, MD ? ?[facility] ?[address] ?[place], [place] [address] ?[phone] ? ? ?

## 2022-02-24 NOTE — Patient Instructions (Addendum)
Minimize fluids 4 hours prior to bedtime, and urinate right before going to bed.  You can try to take the Myrbetriq in the evening to see if this helps with the overnight urination.  Continue to avoid liquids that irritate your bladder including diet drinks and tea. ? ? ?Overactive Bladder, Adult ?Overactive bladder is a condition in which a person has a sudden and frequent need to urinate. A person might also leak urine if he or she cannot get to the bathroom fast enough (urinary incontinence). Sometimes, symptoms can interfere with work or social activities. ?What are the causes? ?Overactive bladder is associated with poor nerve signals between your bladder and your brain. Your bladder may get the signal to empty before it is full. You may also have very sensitive muscles that make your bladder squeeze too soon. This condition may also be caused by other factors, such as: ?Medical conditions: ?Urinary tract infection. ?Infection of nearby tissues. ?Prostate enlargement. ?Bladder stones, inflammation, or tumors. ?Diabetes. ?Muscle or nerve weakness, especially from these conditions: ?A spinal cord injury. ?Stroke. ?Multiple sclerosis. ?Parkinson's disease. ?Other causes: ?Surgery on the uterus or urethra. ?Drinking too much caffeine or alcohol. ?Certain medicines, especially those that eliminate extra fluid in the body (diuretics). ?Constipation. ?What increases the risk? ?You may be at greater risk for overactive bladder if you: ?Are an older adult. ?Smoke. ?Are going through menopause. ?Have prostate problems. ?Have a neurological disease, such as stroke, dementia, Parkinson's disease, or multiple sclerosis (MS). ?Eat or drink alcohol, spicy food, caffeine, and other things that irritate the bladder. ?Are overweight or obese. ?What are the signs or symptoms? ?Symptoms of this condition include a sudden, strong urge to urinate. Other symptoms include: ?Leaking urine. ?Urinating 8 or more times a day. ?Waking up to  urinate 2 or more times overnight. ?How is this diagnosed? ?This condition may be diagnosed based on: ?Your symptoms and medical history. ?A physical exam. ?Blood or urine tests to check for possible causes, such as infection. ?You may also need to see a health care provider who specializes in urinary tract problems. This is called a urologist. ?How is this treated? ?Treatment for overactive bladder depends on the cause of your condition and whether it is mild or severe. Treatment may include: ?Bladder training, such as: ?Learning to control the urge to urinate by following a schedule to urinate at regular intervals. ?Doing Kegel exercises to strengthen the pelvic floor muscles that support your bladder. ?Special devices, such as: ?Biofeedback. This uses sensors to help you become aware of your body's signals. ?Electrical stimulation. This uses electrodes placed inside the body (implanted) or outside the body. These electrodes send gentle pulses of electricity to strengthen the nerves or muscles that control the bladder. ?Women may use a plastic device, called a pessary, that fits into the vagina and supports the bladder. ?Medicines, such as: ?Antibiotics to treat bladder infection. ?Antispasmodics to stop the bladder from releasing urine at the wrong time. ?Tricyclic antidepressants to relax bladder muscles. ?Injections of botulinum toxin type A directly into the bladder tissue to relax bladder muscles. ?Surgery, such as: ?A device may be implanted to help manage the nerve signals that control urination. ?An electrode may be implanted to stimulate electrical signals in the bladder. ?A procedure may be done to change the shape of the bladder. This is done only in very severe cases. ?Follow these instructions at home: ?Eating and drinking ? ?Make diet or lifestyle changes recommended by your health care provider. These may  include: ?Drinking fluids throughout the day and not only with meals. ?Cutting down on caffeine  or alcohol. ?Eating a healthy and balanced diet to prevent constipation. This may include: ?Choosing foods that are high in fiber, such as beans, whole grains, and fresh fruits and vegetables. ?Limiting foods that are high in fat and processed sugars, such as fried and sweet foods. ?Lifestyle ? ?Lose weight if needed. ?Do not use any products that contain nicotine or tobacco. These include cigarettes, chewing tobacco, and vaping devices, such as e-cigarettes. If you need help quitting, ask your health care provider. ?General instructions ?Take over-the-counter and prescription medicines only as told by your health care provider. ?If you were prescribed an antibiotic medicine, take it as told by your health care provider. Do not stop taking the antibiotic even if you start to feel better. ?Use any implants or pessary as told by your health care provider. ?If needed, wear pads to absorb urine leakage. ?Keep a log to track how much and when you drink, and when you need to urinate. This will help your health care provider monitor your condition. ?Keep all follow-up visits. This is important. ?Contact a health care provider if: ?You have a fever or chills. ?Your symptoms do not get better with treatment. ?Your pain and discomfort get worse. ?You have more frequent urges to urinate. ?Get help right away if: ?You are not able to control your bladder. ?Summary ?Overactive bladder refers to a condition in which a person has a sudden and frequent need to urinate. ?Several conditions may lead to an overactive bladder. ?Treatment for overactive bladder depends on the cause and severity of your condition. ?Making lifestyle changes, doing Kegel exercises, keeping a log, and taking medicines can help with this condition. ?This information is not intended to replace advice given to you by your health care provider. Make sure you discuss any questions you have with your health care provider. ?Document Revised: 08/19/2020 Document  Reviewed: 08/19/2020 ?Elsevier Patient Education ? 2022 [place]. ? ? ? ?

## 2022-06-12 ENCOUNTER — Other Ambulatory Visit: Payer: Self-pay | Admitting: Urology

## 2023-02-26 NOTE — Progress Notes (Unsigned)
03/01/2023 9:53 AM   Linda [name] 02-08-1946 [id number]  Referring provider: Sofie Hartigan, MD [address],  [place] [address]  Urological history: 1. Urological history -Contributing factors of age, vaginal atrophy, dementia, stroke, sleep apnea, hypertension, CHF, depression, smoking and anxiety -Myrbetriq 50 mg daily   2. Nocturia -Risk factors for nocturia: obstructive sleep apnea, hypertension, arthritis, heart disease, anxiety and depression and dementia  Chief Complaint  Patient presents with   Over Active Bladder    HPI: Linda [name] is a 77 y.o. female who presents today for yearly visit with her daughter,  Linda [name].  History is obtained from the daughter.  She is having 1-7 daytime voids, mild urge to urinate.  She has urge incontinence.  She is leaking 3 or more times daily.  She is wearing depends daily and does engage in toilet mapping.  Patient denies any modifying or aggravating factors.  Patient denies any gross hematuria, dysuria or suprapubic/flank pain.  Patient denies any fevers, chills, nausea or vomiting.    Her daughter stated that she wears pads continuously and is constantly having episodes of incontinence.  Her mother is not telling her when she needs to use the restroom and she has nighttime incontinence as well.  They are having to wash bed sheets daily.  Her daughter thinks that she might go to the restroom twice during the day.    PVR 156 mL   PMH: Past Medical History:  Diagnosis Date   Anxiety    Benign neoplasm of colon 03/27/2014   Brain aneurysm 2006   Cerebrovascular disease 06/28/2014   Chronic kidney disease    Dementia, vascular ([place]) 06/19/2014   Depression    Hyperlipidemia 02/20/2019   Pure hypercholesterolemia 03/27/2014   Receptive aphasia    Sleep apnea    Stroke [facility])    Vascular dementia ([place])    Wears dentures     Surgical History: Past Surgical History:  Procedure Laterality Date   CATARACT  EXTRACTION W/PHACO Left 04/27/2018   Procedure: CATARACT EXTRACTION PHACO AND INTRAOCULAR LENS PLACEMENT ([place]) LEFT;  Surgeon: Leandrew Koyanagi, MD;  Location: [facility];  Service: Ophthalmology;  Laterality: Left;   CATARACT EXTRACTION W/PHACO Right 06/14/2018   Procedure: CATARACT EXTRACTION PHACO AND INTRAOCULAR LENS PLACEMENT ([place])  RIGHT;  Surgeon: Leandrew Koyanagi, MD;  Location: [facility];  Service: Ophthalmology;  Laterality: Right;   CEREBRAL ANEURYSM REPAIR      Home Medications:  Allergies as of 03/01/2023   No Known Allergies      Medication List        Accurate as of March 01, 2023  9:53 AM. If you have any questions, ask your nurse or doctor.          STOP taking these medications    clopidogrel 75 MG tablet Commonly known as: PLAVIX Stopped by: Zara Council, PA-C       TAKE these medications    amLODipine 2.5 MG tablet Commonly known as: NORVASC Take 2.5 mg by mouth daily.   aspirin EC 81 MG tablet Take 1 tablet (81 mg total) by mouth daily. Swallow whole.   atorvastatin 40 MG tablet Commonly known as: LIPITOR Take 1 tablet (40 mg total) by mouth daily.   citalopram 20 MG tablet Commonly known as: CELEXA Take 20 mg by mouth daily.   cyanocobalamin 1000 MCG tablet Take 1 tablet (1,000 mcg total) by mouth daily.   furosemide 20 MG tablet Commonly known as:  LASIX Take 1 tablet by mouth daily.   isosorbide mononitrate 30 MG 24 hr tablet Commonly known as: IMDUR Take 1 tablet (30 mg total) by mouth daily.   losartan 100 MG tablet Commonly known as: COZAAR Take by mouth.   memantine 5 MG tablet Commonly known as: NAMENDA Take 5 mg once daily for 1 week, then increase to 5 mg twice daily for memory loss   metoprolol succinate 25 MG 24 hr tablet Commonly known as: TOPROL-XL Take 0.5 tablets (12.5 mg total) by mouth daily.   Myrbetriq 50 MG Tb24 tablet Generic drug: mirabegron ER Take 1 tablet by mouth once  daily   sodium bicarbonate 650 MG tablet Take 1 tablet (650 mg total) by mouth 2 (two) times daily.   Vitamin D (Ergocalciferol) 1.25 MG (50000 UNIT) Caps capsule Commonly known as: DRISDOL Take 50,000 Units by mouth once a week.        Allergies: No Known Allergies  Family History: Family History  Problem Relation Age of Onset   Dementia Mother    Heart attack Brother     Social History:  reports that she has quit smoking. Her smoking use included cigarettes. She smoked an average of 1 pack per day. She has never used smokeless tobacco. She reports that she does not currently use alcohol. She reports that she does not use drugs.  ROS: Pertinent ROS in HPI  Physical Exam: BP 118/68   Pulse (!) 57   Ht 5\' 6"  (1.676 m)   Wt 139 lb (63 kg)   BMI 22.44 kg/m   Constitutional:  Well nourished. Alert and oriented, No acute distress. HEENT: [place] AT, moist mucus membranes.  Trachea midline Cardiovascular: No clubbing, cyanosis, or edema. Respiratory: Normal respiratory effort, no increased work of breathing. Neurologic: Grossly intact, no focal deficits, moving all 4 extremities. Psychiatric: Normal mood and affect.    Laboratory Data: Serum creatinine (01/2023) 2.58 Hemoglobin A1c (01/2023) 6.2 I have reviewed the labs.   Pertinent Imaging:  03/01/23 09:37  Scan Result 156     Assessment & Plan:    1. Incontinence  -Discussed with the patient that her daughter that her bladder and brain communication has likely slowed down and is diminished due to her age and other medical issues -Using timed voids will help prevent accidents and possibly retrain the bladder and brain communication -Also discussed that the medications will likely only result in a 50% improvement in her incontinence -Since they have not seen a great improvement to taking the Myrbetriq, we will have a trial of Gemtesa 75 mg daily  2. Nocturia  -Again discussed the issue of possible sleep apnea and  that if sleep apnea is present and she is not sleeping with a CPAP machine the nighttime urinations will continue and I explained the mechanism of action to both daughter and patient -Advised using rubber sheets on the bed to protect the mattress and Chux pads to protect the sheets  Return in about 6 weeks (around 04/12/2023) for PVR and OAB questionnaire.  These notes generated with voice recognition software. I apologize for typographical errors.  [place], [place] [address]  [place], [place] [address] [phone]

## 2023-03-01 ENCOUNTER — Encounter: Payer: Self-pay | Admitting: Urology

## 2023-03-01 ENCOUNTER — Ambulatory Visit: Payer: Medicare Other | Admitting: Urology

## 2023-03-01 VITALS — BP 118/68 | HR 57 | Ht 66.0 in | Wt 139.0 lb

## 2023-03-01 DIAGNOSIS — R351 Nocturia: Secondary | ICD-10-CM

## 2023-03-01 DIAGNOSIS — R32 Unspecified urinary incontinence: Secondary | ICD-10-CM

## 2023-03-01 DIAGNOSIS — N3281 Overactive bladder: Secondary | ICD-10-CM | POA: Diagnosis not present

## 2023-03-01 LAB — BLADDER SCAN AMB NON-IMAGING: Scan Result: 156

## 2023-03-01 NOTE — Patient Instructions (Signed)
Go sit on the toilet every two hours while awake for 5 minutes even if you do not feel the need to urinate  Take the Gemtesa 75 mg daily in place of the Myrbetriq  Use rubber sheets on the bed to protect the mattress and Chux's pads for reinforcement.  Chux's pads are typically cheaper at places like [employer]

## 2023-04-18 NOTE — Progress Notes (Signed)
04/19/2023 9:47 AM   Linda [name] May 27, 1946 [id number]  Referring provider: Marina Goodell, MD [address] [place],  [place] [address]  Urological history: 1. Urological history -Contributing factors of age, vaginal atrophy, dementia, stroke, sleep apnea, hypertension, CHF, depression, smoking and anxiety -Myrbetriq 50 mg daily   2. Nocturia -Risk factors for nocturia: obstructive sleep apnea, hypertension, arthritis, heart disease, anxiety and depression and dementia  No chief complaint on file.   HPI: Linda [name] is a 77 y.o. female who presents today for 6 week follow up with daughter, [place] after a trial of Gemtesa and behavior modification.   History is obtained from the daughter.  PVR ***    PMH: Past Medical History:  Diagnosis Date   Anxiety    Benign neoplasm of colon 03/27/2014   Brain aneurysm 2006   Cerebrovascular disease 06/28/2014   Chronic kidney disease    Dementia, vascular (HCC) 06/19/2014   Depression    Hyperlipidemia 02/20/2019   Pure hypercholesterolemia 03/27/2014   Receptive aphasia    Sleep apnea    Stroke [facility])    Vascular dementia (HCC)    Wears dentures     Surgical History: Past Surgical History:  Procedure Laterality Date   CATARACT EXTRACTION W/PHACO Left 04/27/2018   Procedure: CATARACT EXTRACTION PHACO AND INTRAOCULAR LENS PLACEMENT (IOC) LEFT;  Surgeon: Lockie Mola, MD;  Location: [facility];  Service: Ophthalmology;  Laterality: Left;   CATARACT EXTRACTION W/PHACO Right 06/14/2018   Procedure: CATARACT EXTRACTION PHACO AND INTRAOCULAR LENS PLACEMENT (IOC)  RIGHT;  Surgeon: Lockie Mola, MD;  Location: [facility];  Service: Ophthalmology;  Laterality: Right;   CEREBRAL ANEURYSM REPAIR      Home Medications:  Allergies as of 04/19/2023   No Known Allergies      Medication List        Accurate as of Apr 18, 2023  9:47 AM. If you have any questions, ask your nurse or doctor.           amLODipine 2.5 MG tablet Commonly known as: NORVASC Take 2.5 mg by mouth daily.   aspirin EC 81 MG tablet Take 1 tablet (81 mg total) by mouth daily. Swallow whole.   atorvastatin 40 MG tablet Commonly known as: LIPITOR Take 1 tablet (40 mg total) by mouth daily.   citalopram 20 MG tablet Commonly known as: CELEXA Take 20 mg by mouth daily.   cyanocobalamin 1000 MCG tablet Take 1 tablet (1,000 mcg total) by mouth daily.   furosemide 20 MG tablet Commonly known as: LASIX Take 1 tablet by mouth daily.   isosorbide mononitrate 30 MG 24 hr tablet Commonly known as: IMDUR Take 1 tablet (30 mg total) by mouth daily.   losartan 100 MG tablet Commonly known as: COZAAR Take by mouth.   memantine 5 MG tablet Commonly known as: NAMENDA Take 5 mg once daily for 1 week, then increase to 5 mg twice daily for memory loss   metoprolol succinate 25 MG 24 hr tablet Commonly known as: TOPROL-XL Take 0.5 tablets (12.5 mg total) by mouth daily.   Myrbetriq 50 MG Tb24 tablet Generic drug: mirabegron ER Take 1 tablet by mouth once daily   sodium bicarbonate 650 MG tablet Take 1 tablet (650 mg total) by mouth 2 (two) times daily.   Vitamin D (Ergocalciferol) 1.25 MG (50000 UNIT) Caps capsule Commonly known as: DRISDOL Take 50,000 Units by mouth once a week.  Allergies: No Known Allergies  Family History: Family History  Problem Relation Age of Onset   Dementia Mother    Heart attack Brother     Social History:  reports that she has quit smoking. Her smoking use included cigarettes. She smoked an average of 1 pack per day. She has never used smokeless tobacco. She reports that she does not currently use alcohol. She reports that she does not use drugs.  ROS: Pertinent ROS in HPI  Physical Exam: There were no vitals taken for this visit.  Constitutional:  Well nourished. Alert and oriented, No acute distress. HEENT: [place] AT, moist mucus membranes.  Trachea  midline, no masses. Cardiovascular: No clubbing, cyanosis, or edema. Respiratory: Normal respiratory effort, no increased work of breathing. GU: No CVA tenderness.  No bladder fullness or masses. Vulvovaginal atrophy w/ pallor, loss of rugae, introital retraction, excoriations.  Vulvar thinning, fusion of labia, clitoral hood retraction, prominent urethral meatus.   *** external genitalia, *** pubic hair distribution, no lesions.  Normal urethral meatus, no lesions, no prolapse, no discharge.   No urethral masses, tenderness and/or tenderness. No bladder fullness, tenderness or masses. *** vagina mucosa, *** estrogen effect, no discharge, no lesions, *** pelvic support, *** cystocele and *** rectocele noted.  No cervical motion tenderness.  Uterus is freely mobile and non-fixed.  No adnexal/parametria masses or tenderness noted.  Anus and perineum are without rashes or lesions.   ***  Neurologic: Grossly intact, no focal deficits, moving all 4 extremities. Psychiatric: Normal mood and affect.    Laboratory Data: Serum creatinine (03/2023) 2.45, GFR 26 I have reviewed the labs.   Pertinent Imaging: ***    Assessment & Plan:    1. Incontinence  -***  2. Nocturia  -***  No follow-ups on file.  These notes generated with voice recognition software. I apologize for typographical errors.  Michiel Cowboy, PA-C  [facility]) [address]  [address] [place], [place] [address] [phone]

## 2023-04-19 ENCOUNTER — Ambulatory Visit (INDEPENDENT_AMBULATORY_CARE_PROVIDER_SITE_OTHER): Payer: Medicare Other | Admitting: Urology

## 2023-04-19 ENCOUNTER — Encounter: Payer: Self-pay | Admitting: Urology

## 2023-04-19 VITALS — BP 127/73 | HR 66 | Ht 66.0 in | Wt 139.0 lb

## 2023-04-19 DIAGNOSIS — R32 Unspecified urinary incontinence: Secondary | ICD-10-CM

## 2023-04-19 DIAGNOSIS — R351 Nocturia: Secondary | ICD-10-CM | POA: Diagnosis not present

## 2023-04-19 LAB — BLADDER SCAN AMB NON-IMAGING: Scan Result: 49

## 2023-05-17 ENCOUNTER — Other Ambulatory Visit: Payer: Self-pay | Admitting: Physician Assistant

## 2023-05-17 DIAGNOSIS — D352 Benign neoplasm of pituitary gland: Secondary | ICD-10-CM

## 2023-05-25 ENCOUNTER — Ambulatory Visit
Admission: RE | Admit: 2023-05-25 | Discharge: 2023-05-25 | Disposition: A | Discharge status: Home / Self Care | Payer: Medicare Other | Source: Ambulatory Visit | Attending: Physician Assistant | Admitting: Physician Assistant

## 2023-05-25 DIAGNOSIS — D352 Benign neoplasm of pituitary gland: Secondary | ICD-10-CM | POA: Insufficient documentation

## 2023-08-17 ENCOUNTER — Ambulatory Visit: Payer: Medicare Other | Admitting: Speech Pathology

## 2023-08-19 ENCOUNTER — Ambulatory Visit: Payer: Medicare Other | Attending: Family Medicine | Admitting: Speech Pathology

## 2023-08-19 DIAGNOSIS — R1312 Dysphagia, oropharyngeal phase: Secondary | ICD-10-CM | POA: Diagnosis present

## 2023-08-19 DIAGNOSIS — R053 Chronic cough: Secondary | ICD-10-CM

## 2023-08-19 NOTE — Therapy (Signed)
OUTPATIENT SPEECH LANGUAGE PATHOLOGY  SWALLOW EVALUATION ONLY   Patient Name: Linda [name] MRN: [id number] DOB:22-Jul-1946, 77 y.o., female Today's Date: 08/19/2023  PCP: Maudie Flakes, MD REFERRING PROVIDER: Maudie Flakes, MD   End of Session - 08/19/23 1132     Visit Number 1    Number of Visits 1    Authorization Type [employer] Medicare    Progress Note Due on Visit --    SLP Start Time 1145    SLP Stop Time  1230    SLP Time Calculation (min) 45 min    Activity Tolerance Patient tolerated treatment well             Past Medical History:  Diagnosis Date   Anxiety    Benign neoplasm of colon 03/27/2014   Brain aneurysm 2006   Cerebrovascular disease 06/28/2014   Chronic kidney disease    Dementia, vascular (HCC) 06/19/2014   Depression    Hyperlipidemia 02/20/2019   Pure hypercholesterolemia 03/27/2014   Receptive aphasia    Sleep apnea    Stroke [facility])    Vascular dementia (HCC)    Wears dentures    Past Surgical History:  Procedure Laterality Date   CATARACT EXTRACTION W/PHACO Left 04/27/2018   Procedure: CATARACT EXTRACTION PHACO AND INTRAOCULAR LENS PLACEMENT (IOC) LEFT;  Surgeon: Lockie Mola, MD;  Location: [facility];  Service: Ophthalmology;  Laterality: Left;   CATARACT EXTRACTION W/PHACO Right 06/14/2018   Procedure: CATARACT EXTRACTION PHACO AND INTRAOCULAR LENS PLACEMENT (IOC)  RIGHT;  Surgeon: Lockie Mola, MD;  Location: [facility];  Service: Ophthalmology;  Laterality: Right;   CEREBRAL ANEURYSM REPAIR     Patient Active Problem List   Diagnosis Date Noted   Severe B12 deficiency, B12 level 91 11/04/2021   Other specified anemias 11/03/2021   Takotsubo cardiomyopathy 11/01/2021   Depression with anxiety 10/31/2021   Atypical pneumonia 10/31/2021   Rhabdomyolysis 10/31/2021   Sepsis (HCC) 10/31/2021   CKD (chronic kidney disease), stage IV (HCC) 10/31/2021   Acute respiratory failure with hypoxia (HCC)  10/31/2021   TIA (transient ischemic attack) 10/31/2021   Elevated troponin 10/31/2021   Hypertensive urgency 06/27/2020   Asymptomatic hypertensive urgency 06/27/2020   Aphasia 06/14/2020   Brain mass 06/14/2020   Chronic systolic CHF (congestive heart failure) (HCC)    Tobacco abuse    Carotid stenosis 02/26/2019   Essential hypertension 02/26/2019   PAD (peripheral artery disease) (HCC) 02/26/2019   Depression 02/20/2019   Hyperlipidemia 02/20/2019   Receptive aphasia 02/20/2019   Cerebrovascular disease 06/28/2014   Dementia, vascular (HCC) 06/19/2014   Benign neoplasm of colon 03/27/2014   Pure hypercholesterolemia 03/27/2014    ONSET DATE: 08/10/2023 date of referral, per chart as been ongoing for several months   REFERRING DIAG: R05.3 (ICD-10-CM) - Chronic cough  THERAPY DIAG:  Chronic cough  Dysphagia, oropharyngeal phase  Rationale for Evaluation and Treatment Rehabilitation  SUBJECTIVE:   SUBJECTIVE STATEMENT: Pt pleasant, was accompanied by her daughter - pt arrived to evaluation with an appearance of excitement "I just love to eat, are we going to eat" Pt accompanied by: family member - pt's daughter  PERTINENT HISTORY: Linda [name] is a 77 year-old woman with a PMH of HTN, CKD, L MCA Infarct, Vascular Dementia with a baseline expressive aphasia who's daughter reports several months of coughing with eating, drinking and taking medications. While pt does have a history of bedside swallow evaluations, she doesn't have a history of aspiration pneumonia.   DIAGNOSTIC FINDINGS:  MRI 05/25/2023 There is no evidence of an acute infarct, midline shift, or extra-axial fluid collection. Chronic infarcts are again seen in the left frontal, parietal, occipital, and temporal lobes with associated hemosiderin staining, most notable in the left temporal lobe. There is ex vacuo dilatation of the left lateral ventricle, particularly of the temporal horn. A small chronic right  frontal cortical infarct is also unchanged.   Confluent T2 hyperintensities in the cerebral white matter bilaterally are unchanged and nonspecific but compatible with severe chronic small vessel ischemic disease. Chronic lacunar infarcts are again noted in the deep cerebral white matter, deep gray nuclei, pons, and cerebellum. Scattered chronic microhemorrhages elsewhere were better demonstrated on the prior MRI which included susceptibility weighted imaging. There is moderate global cerebral atrophy.  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 This is again noted to be separate from the pituitary gland itself. There is no mass effect on the optic chiasm.    PAIN:  Are you having pain? No  FALLS: Has patient fallen in last 6 months?  No  LIVING ENVIRONMENT: Lives with: lives with their spouse and pt's daughter helps  Lives in: House/apartment  PLOF:  Level of assistance: Needed assistance with ADLs, Needed assistance with IADLS Employment: Retired   PATIENT GOALS  to decrease risk of aspiration  OBJECTIVE:   COGNITION: Overall cognitive status: History of cognitive impairments - at baseline  ORAL MOTOR EXAMINATION Facial : WFL Lingual: WFL Velum: WFL Mandible: WFL Cough: WFL Voice: WFL   CLINICAL SWALLOW ASSESSMENT:   Current diet: regular and thin liquids Dentition: dentures (top) and dentures (bottom) - not properly fitted Feeding: able to feed self and needs set up Consistencies tested: Thin Liquid: Presentation: Straw and Self-fed Oral Phase: WFL Pharyngeal Phase: WFL Puree: Presentation: Spoon and Self-fed Oral Phase: WFL Pharyngeal Phase: WFL Regular: Presentation: By hand Oral Phase: WFL Pharyngeal Phase: WFL   Evaluation findings: Patient presents with oropharyngeal swallow which appears clinically to be within functional limits with adequate  airway protection. Oral stage is characterized by appearance of adequate oral containment, mastication, bolus formation, oral transfer and oral clearance. Swallow initiation appears timely. No overt signs of aspiration observed despite challenging with consecutive straw sips of thin liquids in excess of 3oz.  Aspiration risk factors:History of CVA, Neurological disease, and Cognitive impairment Overall aspiration risk:Mild Diet Recommendations: regular and thin liquids Precautions:Minimize environmental distractions, Slow rate, Small sips/bites, Seated upright 90 degrees, and Remain upright for at least 30 minutes after meals Supervision: Patient able to feed self, Full supervision/cueing for compensatory strategies, and Intermittent supervision to cue for compensatory strategies Oral care recommendations:Oral care BID   PATIENT EDUCATION: Education details: results of this assessment, general aspiration precautions Person educated: Patient and Child(ren) Education method: Explanation Education comprehension: verbalized understanding   ASSESSMENT:  CLINICAL IMPRESSION: Patient is a 77 y.o. female who was seen today for a clinical swallow evaluation d/t report of chronic cough during PO consumption. Despite ill-fitting dentures, pt presents adequate oropharyngeal abilities when consuming puree, peanut butter crackers and thin liquids via straw. Overt s/s of aspiration/dysphagia were not observed. Education provided on dysphagia, reduced risk of aspiration and general aspiration precautions. At this time, skilled dysphagia therapy does not appear indicated.   PLAN: Therapy is not indicated at this time.    Cloyd Ragas B. Dreama Saa, M.S., CCC-SLP, CBIS Speech-Language Pathologist Certified Brain Injury Specialist [facility]  Medical Center Rehabilitation Services Office [phone] Ascom [phone] Fax [phone]

## 2023-08-24 ENCOUNTER — Ambulatory Visit: Payer: Medicare Other | Admitting: Speech Pathology

## 2023-08-26 ENCOUNTER — Ambulatory Visit: Payer: Medicare Other | Admitting: Speech Pathology

## 2023-08-31 ENCOUNTER — Ambulatory Visit: Payer: Medicare Other | Admitting: Speech Pathology

## 2023-09-08 ENCOUNTER — Ambulatory Visit: Payer: Medicare Other | Admitting: Speech Pathology

## 2023-09-13 ENCOUNTER — Ambulatory Visit: Payer: Medicare Other | Admitting: Speech Pathology

## 2023-09-15 ENCOUNTER — Ambulatory Visit: Payer: Medicare Other | Admitting: Speech Pathology

## 2023-09-20 ENCOUNTER — Ambulatory Visit: Payer: Medicare Other | Admitting: Speech Pathology

## 2023-09-22 ENCOUNTER — Ambulatory Visit: Payer: Medicare Other | Admitting: Speech Pathology

## 2023-09-27 ENCOUNTER — Ambulatory Visit: Payer: Medicare Other | Admitting: Speech Pathology

## 2023-09-29 ENCOUNTER — Ambulatory Visit: Payer: Medicare Other | Admitting: Speech Pathology

## 2024-01-03 ENCOUNTER — Other Ambulatory Visit (INDEPENDENT_AMBULATORY_CARE_PROVIDER_SITE_OTHER): Payer: Self-pay | Admitting: Nurse Practitioner

## 2024-01-03 DIAGNOSIS — N184 Chronic kidney disease, stage 4 (severe): Secondary | ICD-10-CM

## 2024-01-06 ENCOUNTER — Encounter (INDEPENDENT_AMBULATORY_CARE_PROVIDER_SITE_OTHER): Payer: Medicare Other | Admitting: Vascular Surgery

## 2024-01-06 ENCOUNTER — Encounter (INDEPENDENT_AMBULATORY_CARE_PROVIDER_SITE_OTHER): Payer: Medicare Other

## 2024-01-06 ENCOUNTER — Other Ambulatory Visit (INDEPENDENT_AMBULATORY_CARE_PROVIDER_SITE_OTHER): Payer: Medicare Other

## 2024-02-16 ENCOUNTER — Ambulatory Visit (INDEPENDENT_AMBULATORY_CARE_PROVIDER_SITE_OTHER): Payer: Medicare Other | Admitting: Nurse Practitioner

## 2024-02-16 ENCOUNTER — Ambulatory Visit (INDEPENDENT_AMBULATORY_CARE_PROVIDER_SITE_OTHER): Payer: Medicare Other

## 2024-02-16 ENCOUNTER — Encounter (INDEPENDENT_AMBULATORY_CARE_PROVIDER_SITE_OTHER): Payer: Self-pay | Admitting: Nurse Practitioner

## 2024-02-16 VITALS — BP 120/74 | HR 52 | Resp 18 | Ht 64.0 in | Wt 139.0 lb

## 2024-02-16 DIAGNOSIS — I10 Essential (primary) hypertension: Secondary | ICD-10-CM | POA: Diagnosis not present

## 2024-02-16 DIAGNOSIS — N184 Chronic kidney disease, stage 4 (severe): Secondary | ICD-10-CM

## 2024-02-16 DIAGNOSIS — E7800 Pure hypercholesterolemia, unspecified: Secondary | ICD-10-CM | POA: Diagnosis not present

## 2024-02-16 NOTE — Progress Notes (Signed)
 Subjective:    Patient ID: Linda [name], female    DOB: [birth year], 78 y.o.   MRN: [id number] Chief Complaint  Patient presents with   New Patient (Initial Visit)    NP. vmap/ue art/consult. CKD4. place avf. lateef    The patient is seen for evaluation for dialysis access. The patient has chronic renal insufficiency stage V secondary to hypertension. The patient's most recent creatinine clearance is less than 20. The patient volume status has not yet become an issue. Patient's blood pressures been relatively well controlled. There are mild uremic symptoms which appear to be relatively well tolerated at this time.  The patient notes the kidney problem has been present for a long time and has been progressively getting worse.  The patient is followed by nephrology.    The patient is right-handed.  The patient has been considering the various methods of dialysis and wishes to proceed with hemodialysis and therefore creation of AV access is indicated.  No recent shortening of the patient's walking distance or new symptoms consistent with claudication.  No history of rest pain symptoms. No new ulcers or wounds of the lower extremities have occurred.  The patient denies amaurosis fugax or recent TIA symptoms. There are no recent neurological changes noted. There is no history of DVT, PE or superficial thrombophlebitis. No recent episodes of angina or shortness of breath documented.     Review of Systems  Respiratory:  Negative for shortness of breath.   Musculoskeletal:  Positive for gait problem.  All other systems reviewed and are negative.      Objective:   Physical Exam Vitals reviewed.  HENT:     Head: Normocephalic.  Cardiovascular:     Rate and Rhythm: Normal rate.     Pulses:          Radial pulses are 1+ on the right side and 2+ on the left side.  Pulmonary:     Effort: Pulmonary effort is normal.  Skin:    General: Skin is warm and dry.  Neurological:      Mental Status: She is alert and oriented to person, place, and time.     Motor: Weakness present.     Gait: Gait abnormal.  Psychiatric:        Mood and Affect: Mood normal.        Behavior: Behavior normal.        Thought Content: Thought content normal.        Judgment: Judgment normal.     BP 120/74   Pulse (!) 52   Resp 18   Ht 5\' 4"  (1.626 m)   Wt 139 lb (63 kg)   BMI 23.86 kg/m   Past Medical History:  Diagnosis Date   Anxiety    Benign neoplasm of colon 03/27/2014   Brain aneurysm 2006   Cerebrovascular disease 06/28/2014   Chronic kidney disease    Dementia, vascular (HCC) 06/19/2014   Depression    Hyperlipidemia 02/20/2019   Pure hypercholesterolemia 03/27/2014   Receptive aphasia    Sleep apnea    Stroke (HCC)    Vascular dementia (HCC)    Wears dentures     Social History   Socioeconomic History   Marital status: Married    Spouse name: Not on file   Number of children: Not on file   Years of education: Not on file   Highest education level: Not on file  Occupational History   Not on file  Tobacco Use  Smoking status: Former    Current packs/day: 1.00    Types: Cigarettes   Smokeless tobacco: Never  Vaping Use   Vaping status: Never Used  Substance and Sexual Activity   Alcohol use: Not Currently   Drug use: Never   Sexual activity: Not on file  Other Topics Concern   Not on file  Social History Narrative   Not on file   Social Drivers of Health   Financial Resource Strain: Low Risk  (02/14/2024)   Received from [facility] [place] [facility]   Overall Financial Resource Strain (CARDIA)    Difficulty of Paying Living Expenses: Not hard at all  Food Insecurity: No Food Insecurity (02/14/2024)   [facility] from [facility]   Hunger Vital Sign    Worried About Running Out of Food in the Last Year: Never true    Ran Out of Food in the Last Year: Never true  Transportation Needs: No Transportation Needs (02/14/2024)   Received from  [facility] - Transportation    In the past 12 months, has lack of transportation kept you from medical appointments or from getting medications?: No    Lack of Transportation (Non-Medical): No  Physical Activity: Not on file  Stress: Not on file  Social Connections: Not on file  Intimate Partner Violence: Not on file    Past Surgical History:  Procedure Laterality Date   CATARACT EXTRACTION W/PHACO Left 04/27/2018   Procedure: CATARACT EXTRACTION PHACO AND INTRAOCULAR LENS PLACEMENT (IOC) LEFT;  Surgeon: Lockie Mola, MD;  Location: [facility];  Service: Ophthalmology;  Laterality: Left;   CATARACT EXTRACTION W/PHACO Right 06/14/2018   Procedure: CATARACT EXTRACTION PHACO AND INTRAOCULAR LENS PLACEMENT (IOC)  RIGHT;  Surgeon: Lockie Mola, MD;  Location: [facility];  Service: Ophthalmology;  Laterality: Right;   CEREBRAL ANEURYSM REPAIR      Family History  Problem Relation Age of Onset   Dementia Mother    Heart attack Brother     No Known Allergies     Latest Ref Rng & Units 11/05/2021    7:04 AM 11/04/2021    9:07 AM 11/03/2021    5:30 PM  CBC  WBC 4.0 - 10.5 K/uL 10.4  9.5  11.2   Hemoglobin 12.0 - 15.0 g/dL 8.9  8.5  8.8   Hematocrit 36.0 - 46.0 % 26.8  25.9  26.8   Platelets 150 - 400 K/uL 190  198  204       CMP     Component Value Date/Time   NA 136 11/05/2021 0704   NA 142 03/17/2012 1016   K 3.9 11/05/2021 0704   K 3.3 (L) 03/17/2012 1016   CL 113 (H) 11/05/2021 0704   CL 107 03/17/2012 1016   CO2 18 (L) 11/05/2021 0704   CO2 23 03/17/2012 1016   GLUCOSE 111 (H) 11/05/2021 0704   GLUCOSE 116 (H) 03/17/2012 1016   BUN 36 (H) 11/05/2021 0704   BUN 27 (H) 03/17/2012 1016   CREATININE 2.37 (H) 11/05/2021 0704   CREATININE 1.28 03/17/2012 1016   CALCIUM 8.4 (L) 11/05/2021 0704   CALCIUM 9.3 03/17/2012 1016   PROT 8.2 (H) 10/31/2021 0648   PROT 8.4 (H) 03/17/2012 1016   ALBUMIN 3.9 10/31/2021  0648   ALBUMIN 3.9 03/17/2012 1016   AST 59 (H) 10/31/2021 0648   AST 19 03/17/2012 1016   ALT 23 10/31/2021 0648   ALT 13 03/17/2012 1016   ALKPHOS  48 10/31/2021 0648   ALKPHOS 47 (L) 03/17/2012 1016   BILITOT 0.5 10/31/2021 0648   BILITOT 0.5 03/17/2012 1016   GFRNONAA 21 (L) 11/05/2021 0704   GFRNONAA 44 (L) 03/17/2012 1016     No results found.     Assessment & Plan:   1. Chronic kidney disease, stage IV (severe) (HCC) (Primary) Recommend:  At this time the patient does not have appropriate extremity access for dialysis  Patient should have a left brachial axillary graft created.  The risks, benefits and alternative therapies were reviewed in detail with the patient.  All questions were answered.  The patient agrees to proceed with surgery.   The patient will follow up with me in the office after the surgery.  2. Essential hypertension Continue antihypertensive medications as already ordered, these medications have been reviewed and there are no changes at this time.  3. Pure hypercholesterolemia Continue statin as ordered and reviewed, no changes at this time   Current Outpatient Medications on File Prior to Visit  Medication Sig Dispense Refill   amLODipine (NORVASC) 2.5 MG tablet Take 2.5 mg by mouth daily.     aspirin EC 81 MG EC tablet Take 1 tablet (81 mg total) by mouth daily. Swallow whole. 30 tablet 11   atorvastatin (LIPITOR) 40 MG tablet Take 1 tablet (40 mg total) by mouth daily. 30 tablet 2   citalopram (CELEXA) 20 MG tablet Take 20 mg by mouth daily.     clopidogrel (PLAVIX) 75 MG tablet Take 75 mg by mouth daily.     furosemide (LASIX) 20 MG tablet Take 1 tablet by mouth daily.     isosorbide mononitrate (IMDUR) 30 MG 24 hr tablet Take 1 tablet (30 mg total) by mouth daily. 30 tablet 0   losartan (COZAAR) 100 MG tablet Take by mouth.     Melatonin 3 MG CAPS Take 1 capsule by mouth at bedtime.     memantine (NAMENDA) 5 MG tablet Take 5 mg once daily  for 1 week, then increase to 5 mg twice daily for memory loss     metoprolol succinate (TOPROL-XL) 25 MG 24 hr tablet Take 0.5 tablets (12.5 mg total) by mouth daily. 30 tablet 0   MYRBETRIQ 50 MG TB24 tablet Take 1 tablet by mouth once daily 30 tablet 11   sodium bicarbonate 650 MG tablet Take 1 tablet (650 mg total) by mouth 2 (two) times daily. 30 tablet 0   vitamin B-12 1000 MCG tablet Take 1 tablet (1,000 mcg total) by mouth daily. 30 tablet 0   Vitamin D, Ergocalciferol, (DRISDOL) 50000 units CAPS capsule Take 50,000 Units by mouth once a week.      No current facility-administered medications on file prior to visit.    There are no Patient Instructions on file for this visit. No follow-ups on file.   Georgiana Spinner, NP
